# Patient Record
Sex: Male | Born: 1945 | Race: White | Hispanic: No | Marital: Married | Smoking: Never smoker
Health system: Southern US, Community
[De-identification: ages and names within clinical notes are randomized; demographics above are authoritative.]

## PROBLEM LIST (undated history)

## (undated) DIAGNOSIS — N200 Calculus of kidney: Secondary | ICD-10-CM

## (undated) DIAGNOSIS — I4891 Unspecified atrial fibrillation: Secondary | ICD-10-CM

## (undated) DIAGNOSIS — M722 Plantar fascial fibromatosis: Secondary | ICD-10-CM

## (undated) DIAGNOSIS — I509 Heart failure, unspecified: Secondary | ICD-10-CM

## (undated) HISTORY — DX: Plantar fascial fibromatosis: M72.2

## (undated) HISTORY — PX: OTHER SURGICAL HISTORY: SHX169

## (undated) HISTORY — DX: Calculus of kidney: N20.0

## (undated) HISTORY — PX: COLONOSCOPY: SHX174

---
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
## 2002-01-07 ENCOUNTER — Encounter: Payer: Self-pay | Admitting: Internal Medicine

## 2002-03-01 ENCOUNTER — Encounter: Payer: Self-pay | Admitting: Internal Medicine

## 2004-02-28 ENCOUNTER — Ambulatory Visit: Payer: Self-pay | Admitting: Internal Medicine

## 2004-03-06 ENCOUNTER — Ambulatory Visit: Payer: Self-pay | Admitting: Internal Medicine

## 2005-03-07 ENCOUNTER — Ambulatory Visit: Payer: Self-pay | Admitting: Internal Medicine

## 2005-03-25 ENCOUNTER — Ambulatory Visit: Payer: Self-pay | Admitting: Internal Medicine

## 2006-04-04 ENCOUNTER — Ambulatory Visit: Payer: Self-pay | Admitting: Internal Medicine

## 2006-04-04 LAB — CONVERTED CEMR LAB
ALT: 14 units/L (ref 0–40)
AST: 36 units/L (ref 0–37)
Albumin: 3.9 g/dL (ref 3.5–5.2)
Alkaline Phosphatase: 88 units/L (ref 39–117)
BUN: 29 mg/dL — ABNORMAL HIGH (ref 6–23)
Basophils Absolute: 0 10*3/uL (ref 0.0–0.1)
Basophils Relative: 0.4 % (ref 0.0–1.0)
CO2: 29 meq/L (ref 19–32)
Calcium: 9.3 mg/dL (ref 8.4–10.5)
Chloride: 109 meq/L (ref 96–112)
Cholesterol: 204 mg/dL (ref 0–200)
Creatinine, Ser: 1.3 mg/dL (ref 0.4–1.5)
Direct LDL: 141.1 mg/dL
Eosinophils Relative: 3.9 % (ref 0.0–5.0)
GFR calc Af Amer: 73 mL/min
GFR calc non Af Amer: 60 mL/min
Glucose, Bld: 90 mg/dL (ref 70–99)
HCT: 50.1 % (ref 39.0–52.0)
HDL: 44.5 mg/dL (ref >39.0)
Hemoglobin: 16.7 g/dL (ref 13.0–17.0)
Lymphocytes Relative: 33.7 % (ref 12.0–46.0)
MCHC: 33.3 g/dL (ref 30.0–36.0)
MCV: 91.6 fL (ref 78.0–100.0)
Monocytes Absolute: 0.9 10*3/uL — ABNORMAL HIGH (ref 0.2–0.7)
Monocytes Relative: 15.1 % — ABNORMAL HIGH (ref 3.0–11.0)
Neutro Abs: 2.9 10*3/uL (ref 1.4–7.7)
Neutrophils Relative %: 46.9 % (ref 43.0–77.0)
PSA: 0.59 ng/mL (ref 0.10–4.00)
Platelets: 307 10*3/uL (ref 150–400)
Potassium: 4.9 meq/L (ref 3.5–5.1)
RBC: 5.47 M/uL (ref 4.22–5.81)
RDW: 11.9 % (ref 11.5–14.6)
Sodium: 143 meq/L (ref 135–145)
TSH: 1.26 microintl units/mL (ref 0.35–5.50)
Total Bilirubin: 1.4 mg/dL — ABNORMAL HIGH (ref 0.3–1.2)
Total CHOL/HDL Ratio: 4.6
Total Protein: 6.7 g/dL (ref 6.0–8.3)
Triglycerides: 107 mg/dL (ref 0–149)
VLDL: 21 mg/dL (ref 0–40)
WBC: 6 10*3/uL (ref 4.5–10.5)

## 2006-04-11 ENCOUNTER — Ambulatory Visit: Payer: Self-pay | Admitting: Internal Medicine

## 2007-03-30 ENCOUNTER — Ambulatory Visit: Payer: Self-pay | Admitting: Internal Medicine

## 2007-03-30 LAB — CONVERTED CEMR LAB
ALT: 10 units/L (ref 0–53)
AST: 24 units/L (ref 0–37)
Albumin: 3.9 g/dL (ref 3.5–5.2)
Alkaline Phosphatase: 80 units/L (ref 39–117)
BUN: 17 mg/dL (ref 6–23)
Basophils Absolute: 0 10*3/uL (ref 0.0–0.1)
Calcium: 9.3 mg/dL (ref 8.4–10.5)
Chloride: 104 meq/L (ref 96–112)
Creatinine, Ser: 1.1 mg/dL (ref 0.4–1.5)
Eosinophils Absolute: 0.4 10*3/uL (ref 0.0–0.6)
GFR calc non Af Amer: 73 mL/min
Glucose, Urine, Semiquant: 100
HCT: 44.9 % (ref 39.0–52.0)
HDL: 43.3 mg/dL (ref >39.0)
Ketones, urine, test strip: NEGATIVE
MCHC: 35 g/dL (ref 30.0–36.0)
MCV: 91.2 fL (ref 78.0–100.0)
Monocytes Relative: 11.2 % — ABNORMAL HIGH (ref 3.0–11.0)
Nitrite: NEGATIVE
PSA: 0.56 ng/mL (ref 0.10–4.00)
Platelets: 256 10*3/uL (ref 150–400)
Protein, U semiquant: NEGATIVE
RBC: 4.92 M/uL (ref 4.22–5.81)
RDW: 12.1 % (ref 11.5–14.6)
Sodium: 139 meq/L (ref 135–145)
Specific Gravity, Urine: 1.015
Total Bilirubin: 1.1 mg/dL (ref 0.3–1.2)
Total CHOL/HDL Ratio: 4.7
Triglycerides: 98 mg/dL (ref 0–149)
WBC Urine, dipstick: NEGATIVE
pH: 6

## 2007-04-08 ENCOUNTER — Telehealth: Payer: Self-pay | Admitting: Internal Medicine

## 2007-04-08 ENCOUNTER — Telehealth (INDEPENDENT_AMBULATORY_CARE_PROVIDER_SITE_OTHER): Payer: Self-pay | Admitting: *Deleted

## 2007-04-14 ENCOUNTER — Ambulatory Visit: Payer: Self-pay | Admitting: Internal Medicine

## 2007-04-14 DIAGNOSIS — Z87442 Personal history of urinary calculi: Secondary | ICD-10-CM | POA: Insufficient documentation

## 2007-04-22 ENCOUNTER — Ambulatory Visit: Payer: Self-pay | Admitting: Internal Medicine

## 2008-01-19 ENCOUNTER — Encounter: Payer: Self-pay | Admitting: Internal Medicine

## 2009-02-22 ENCOUNTER — Ambulatory Visit: Payer: Self-pay | Admitting: Internal Medicine

## 2009-02-22 LAB — CONVERTED CEMR LAB
BUN: 19 mg/dL (ref 6–23)
Bilirubin Urine: NEGATIVE
Bilirubin, Direct: 0.1 mg/dL (ref 0.0–0.3)
CO2: 28 meq/L (ref 19–32)
Calcium: 9.1 mg/dL (ref 8.4–10.5)
Chloride: 109 meq/L (ref 96–112)
Cholesterol: 210 mg/dL — ABNORMAL HIGH (ref 0–200)
Creatinine, Ser: 1.2 mg/dL (ref 0.4–1.5)
Direct LDL: 138 mg/dL
Eosinophils Absolute: 0.6 10*3/uL (ref 0.0–0.7)
Glucose, Urine, Semiquant: NEGATIVE
Ketones, urine, test strip: NEGATIVE
MCHC: 34 g/dL (ref 30.0–36.0)
MCV: 95.1 fL (ref 78.0–100.0)
Monocytes Absolute: 0.6 10*3/uL (ref 0.1–1.0)
Neutrophils Relative %: 37.3 % — ABNORMAL LOW (ref 43.0–77.0)
PSA: 0.92 ng/mL (ref 0.10–4.00)
Platelets: 143 10*3/uL — ABNORMAL LOW (ref 150.0–400.0)
Protein, U semiquant: NEGATIVE
Total Bilirubin: 1.6 mg/dL — ABNORMAL HIGH (ref 0.3–1.2)
Triglycerides: 87 mg/dL (ref 0.0–149.0)
VLDL: 17.4 mg/dL (ref 0.0–40.0)
WBC: 5.7 10*3/uL (ref 4.5–10.5)
pH: 5

## 2009-03-01 ENCOUNTER — Ambulatory Visit: Payer: Self-pay | Admitting: Internal Medicine

## 2010-01-18 ENCOUNTER — Telehealth: Payer: Self-pay | Admitting: Internal Medicine

## 2010-04-17 NOTE — Progress Notes (Signed)
Summary: REQ FOR SHINGLES VACC RX  Phone Note Call from Patient   Caller: Spouse   [phone] Summary of Call: Pt called to adv she and her husband need a Rx for shingles vaccine...Marland KitchenMarland KitchenMarland Kitchen Pt can be reached at [phone] to advise when same has been prepared and is ready for p/u.  Initial call taken by: Debbra Riding,  January 18, 2010 10:18 AM  Follow-up for Phone Call        [facility] does not do shingles injections so pt will schedule appt to come here at a later date Follow-up by: Alfred Levins, CMA,  January 18, 2010 3:16 PM

## 2011-02-26 ENCOUNTER — Other Ambulatory Visit: Payer: Self-pay | Admitting: Internal Medicine

## 2011-02-26 ENCOUNTER — Other Ambulatory Visit (INDEPENDENT_AMBULATORY_CARE_PROVIDER_SITE_OTHER): Payer: BC Managed Care – PPO

## 2011-02-26 ENCOUNTER — Encounter: Payer: Self-pay | Admitting: Internal Medicine

## 2011-02-26 ENCOUNTER — Ambulatory Visit (INDEPENDENT_AMBULATORY_CARE_PROVIDER_SITE_OTHER): Payer: BC Managed Care – PPO | Admitting: Internal Medicine

## 2011-02-26 VITALS — BP 124/68 | HR 53 | Temp 98.0°F | Resp 16 | Ht 70.0 in | Wt 179.8 lb

## 2011-02-26 DIAGNOSIS — Z87442 Personal history of urinary calculi: Secondary | ICD-10-CM

## 2011-02-26 DIAGNOSIS — Z0000 Encounter for general adult medical examination without abnormal findings: Secondary | ICD-10-CM

## 2011-02-26 DIAGNOSIS — M722 Plantar fascial fibromatosis: Secondary | ICD-10-CM

## 2011-02-26 NOTE — Progress Notes (Signed)
  Subjective:    Patient ID: Walter George, male    DOB: 04/07/1945, 65 y.o.   MRN: [id number]  HPI Walter George presents to establish for continuity of care in transfer from Dr. Cato Mulligan. He is feeling well. He does have heel pain - plantar fasciitis. He does remain very active doing multi-sports. He has no active medical problems.  Past Medical History  Diagnosis Date  . Kidney stones     3 stones, last 20 years ago.  . Plantar fasciitis    Past Surgical History  Procedure Date  . Tonsillectomy 1953   Family History  Problem Relation Age of Onset  . Cancer Father     lymphoma  . Dementia Mother   . Dementia Paternal Aunt    History   Social History  . Marital Status: Married    Spouse Name: N/A    Number of Children: 2  . Years of Education: 16   Occupational History  . banker     retired   Social History Main Topics  . Smoking status: Never Smoker   . Smokeless tobacco: Never Used  . Alcohol Use: 4.5 oz/week    9 drink(s) per week     Occassionaly  . Drug Use: No  . Sexually Active: Yes -- Male partner(s)   Other Topics Concern  . Not on file   Social History Narrative   HSG, [place]. Married '73. 1 dtr - '76, 1 son '79; 4 grandchildren. Work Chief of Staff, retired. Very active: sports, civic activities.        Review of Systems Constitutional:  Negative for fever, chills, activity change and unexpected weight change.  HEENT:  Negative for hearing loss, ear pain, congestion, neck stiffness and postnasal drip. Negative for sore throat or swallowing problems. Negative for dental complaints.   Eyes: Negative for vision loss or change in visual acuity.  Respiratory: Negative for chest tightness and wheezing. Negative for DOE.   Cardiovascular: Negative for chest pain or palpitations. No decreased exercise tolerance Gastrointestinal: No change in bowel habit. No bloating or gas. No reflux or indigestion Genitourinary: Negative for urgency,  frequency, flank pain and difficulty urinating.  Musculoskeletal: Negative for myalgias, back pain, arthralgias and gait problem.  Neurological: Negative for dizziness, tremors, weakness and headaches.  Hematological: Negative for adenopathy.  Psychiatric/Behavioral: Negative for behavioral problems and dysphoric mood.       Objective:   Physical Exam Vitals reviewed - stable Gen'l- well nourished, athletic appearing white male in no distress HEENT- Cerumen impactions bilaterally - after irrigation TMs normal; C&S clear, pupils equal,round and reactive; oropharynx with native dentition in good repair, no buccal or palatal lesions; throat clear Neck - supple, w/o thyromegaly Nodes - negative submandibular, cervical and supraclavicular regions Chest - no deformity Pulm - normal breath sounds Cor - 2+ radial and DP pulses, RRR w/o murmur, rub, gallop Abd - BS+ x 4 quadrants, no hepato-splenomegaly, no guarding or rebound Genitalia - deferred. Normal PSA '09,'10,'11 Extremities - no deformity, normal ROM small, medium and large joints Neuro - A&O x 3, CN II-XII normal, MS normal, gait and balance normal. Derm - fair skin, no lesions noted face, neck, upper back, chest or abdomen          Assessment & Plan:

## 2011-02-27 DIAGNOSIS — M722 Plantar fascial fibromatosis: Secondary | ICD-10-CM | POA: Insufficient documentation

## 2011-02-27 DIAGNOSIS — Z0000 Encounter for general adult medical examination without abnormal findings: Secondary | ICD-10-CM | POA: Insufficient documentation

## 2011-02-27 LAB — HEPATIC FUNCTION PANEL
ALT: 11 U/L (ref 0–53)
AST: 25 U/L (ref 0–37)
Alkaline Phosphatase: 80 U/L (ref 39–117)
Bilirubin, Direct: 0.1 mg/dL (ref 0.0–0.3)
Total Protein: 6.4 g/dL (ref 6.0–8.3)

## 2011-02-27 LAB — LIPID PANEL
Cholesterol: 209 mg/dL — ABNORMAL HIGH (ref 0–200)
HDL: 60.1 mg/dL (ref >39.00)
Total CHOL/HDL Ratio: 3
VLDL: 21.8 mg/dL (ref 0.0–40.0)

## 2011-02-27 LAB — COMPREHENSIVE METABOLIC PANEL
AST: 25 U/L (ref 0–37)
Alkaline Phosphatase: 80 U/L (ref 39–117)
BUN: 27 mg/dL — ABNORMAL HIGH (ref 6–23)
Creatinine, Ser: 1.5 mg/dL (ref 0.4–1.5)

## 2011-02-27 NOTE — Assessment & Plan Note (Signed)
No complaints. Suggested using generous amount of lemon in water (lime in [place]) to acidify urine and further reduce risk of recurrent stone.

## 2011-02-27 NOTE — Assessment & Plan Note (Signed)
Nagging type of pain that does not limit activities. He has not had sports medicine evaluation and he does not use inserts.  Plan - refer to [email or web address], search plantar fasciitis and stretch for instructional videos           For worsening pain or limitations will refer to Dr. Roanna Epley

## 2011-02-27 NOTE — Assessment & Plan Note (Signed)
Medical history is benign. Physical exam is normal except for cerumen impactions. These impactions were easily irrigated clear. Recent lab reviewed: long history of normal lipid panels - no indication to repeat. Discussed pros and cons of prostate cancer screening (USPHCTF recommendations reviewed) and with normal PSA 3 years in a row he defers evaluation at this time. He is current for colorectal cancer screening but is due in 2013. Skin care - he sees Dr. Donzetta Starch on a regular basis. Basic labs are ordered and pending.  In summary - a very nice man who is medically stable. He is oriented to our services including after hours [facility], Saturday clinic and in-patient services that I offer. He will return as needed or in 1 year.

## 2011-03-03 ENCOUNTER — Encounter: Payer: Self-pay | Admitting: Internal Medicine

## 2012-02-04 ENCOUNTER — Encounter: Payer: Self-pay | Admitting: Gastroenterology

## 2012-03-19 ENCOUNTER — Encounter: Payer: BC Managed Care – PPO | Admitting: Internal Medicine

## 2012-04-30 ENCOUNTER — Encounter: Payer: BC Managed Care – PPO | Admitting: Internal Medicine

## 2012-05-06 ENCOUNTER — Encounter: Payer: Self-pay | Admitting: Internal Medicine

## 2012-05-06 ENCOUNTER — Ambulatory Visit (INDEPENDENT_AMBULATORY_CARE_PROVIDER_SITE_OTHER): Payer: Medicare Other | Admitting: Internal Medicine

## 2012-05-06 VITALS — BP 112/76 | HR 50 | Temp 98.2°F | Resp 10 | Ht 69.5 in | Wt 178.2 lb

## 2012-05-06 DIAGNOSIS — Z1211 Encounter for screening for malignant neoplasm of colon: Secondary | ICD-10-CM

## 2012-05-06 DIAGNOSIS — Z0000 Encounter for general adult medical examination without abnormal findings: Secondary | ICD-10-CM

## 2012-05-06 DIAGNOSIS — Z136 Encounter for screening for cardiovascular disorders: Secondary | ICD-10-CM

## 2012-05-06 NOTE — Progress Notes (Signed)
Subjective:    Patient ID: Walter George, male    DOB: [birth year], 67 y.o.   MRN: [id number]  HPI Walter George is here for annual Medicare wellness examination and management of other chronic and acute problems. He is feeling well and has no medical problems since his last visit.    The risk factors are reflected in the social history.  The roster of all physicians providing medical care to patient - is listed in the Snapshot section of the chart.  Activities of daily living:  The patient is 100% inedpendent in all ADLs: dressing, toileting, feeding as well as independent mobility  Home safety : The patient has smoke detectors in the home. Falls - no falls. Home is fall safe. They wear seatbelts.  firearms are present in the home, kept in a safe fashion. There is no violence in the home.   There is no risks for hepatitis, STDs or HIV. There is no history of blood transfusion. They have no travel history to infectious disease endemic areas of the world.  The patient has seen their dentist in the last six month. They have  seen their eye doctor in the last year. They deny any hearing difficulty and have not had audiologic testing in the last year.    They do not  have excessive sun exposure. Discussed the need for sun protection: hats, long sleeves and use of sunscreen if there is significant sun exposure.   Diet: the importance of a healthy diet is discussed. They do have a healthy diet.  The patient has a regular exercise program: tennis, jogging , 90 min duration, 3-4 per week.  The benefits of regular aerobic exercise were discussed.  Depression screen: there are no signs or vegative symptoms of depression- irritability, change in appetite, anhedonia, sadness/tearfullness.  Cognitive assessment: the patient manages all their financial and personal affairs and is actively engaged.   The following portions of the patient's history were reviewed and updated as appropriate:  allergies, current medications, past family history, past medical history,  past surgical history, past social history  and problem list.  Past Medical History  Diagnosis Date  . Kidney stones     3 stones, last 20 years ago.  . Plantar fasciitis    Past Surgical History  Procedure Laterality Date  . Tonsillectomy  1953   Family History  Problem Relation Age of Onset  . Cancer Father     lymphoma  . Dementia Mother   . Dementia Paternal Aunt    History   Social History  . Marital Status: Married    Spouse Name: N/A    Number of Children: 2  . Years of Education: 16   Occupational History  . banker     retired   Social History Main Topics  . Smoking status: Never Smoker   . Smokeless tobacco: Never Used  . Alcohol Use: 4.5 oz/week    9 drink(s) per week     Comment: Occassionaly  . Drug Use: No  . Sexually Active: Yes -- Male partner(s)   Other Topics Concern  . Not on file   Social History Narrative   HSG, [place]. Married '73. 1 dtr - '76, 1 son '79; 4 grandchildren. Work Chief of Staff, retired. Very active: sports, civic activities.                       No current outpatient prescriptions on file prior to visit.   No  current facility-administered medications on file prior to visit.     Vision, hearing, body mass index were assessed and reviewed.   During the course of the visit the patient was educated and counseled about appropriate screening and preventive services including : fall prevention , diabetes screening, nutrition counseling, colorectal cancer screening, and recommended immunizations.    Review of Systems Constitutional:  Negative for fever, chills, activity change and unexpected weight change.  HEENT:  Negative for hearing loss, ear pain, congestion, neck stiffness and postnasal drip. Negative for sore throat or swallowing problems. Negative for dental complaints.   Eyes: Negative for vision loss or change in visual acuity.   Respiratory: Negative for chest tightness and wheezing. Negative for DOE.   Cardiovascular: Negative for chest pain or palpitations. No decreased exercise tolerance Gastrointestinal: No change in bowel habit. No bloating or gas. No reflux or indigestion Genitourinary: Negative for urgency, frequency, flank pain and difficulty urinating.  Musculoskeletal: Negative for myalgias, back pain, arthralgias and gait problem.  Neurological: Negative for dizziness, tremors, weakness and headaches.  Hematological: Negative for adenopathy.  Psychiatric/Behavioral: Negative for behavioral problems and dysphoric mood.       Objective:   Physical Exam Filed Vitals:   05/06/12 1600  BP: 112/76  Pulse: 50  Temp: 98.2 F (36.8 C)  Resp: 10   Wt Readings from Last 3 Encounters:  05/06/12 178 lb 3.2 oz (80.831 kg)  02/26/11 179 lb 12 oz (81.534 kg)  03/01/09 180 lb (81.647 kg)   Gen'l: Well nourished well developed white male in no acute distress  HEENT: Head: Normocephalic and atraumatic. Right Ear: External ear normal. EAC/TM nl. Left Ear: External ear normal.  EAC - cerumen burden/TM poorly visualized. Nose: Nose normal. Mouth/Throat: Oropharynx is clear and moist. Dentition - native, in good repair. No buccal or palatal lesions. Posterior pharynx clear. Eyes: Conjunctivae and sclera clear. EOM intact. Pupils are equal, round, and reactive to light. Right eye exhibits no discharge. Left eye exhibits no discharge. Neck: Normal range of motion. Neck supple. No JVD present. No tracheal deviation present. No thyromegaly present.  Cardiovascular: Normal rate, regular rhythm, no gallop, no friction rub, no murmur heard.      Quiet precordium. 2+ radial and DP pulses . No carotid bruits Pulmonary/Chest: Effort normal. No respiratory distress or increased WOB, no wheezes, no rales. No chest wall deformity or CVAT. Abdomen: Soft. Bowel sounds are normal in all quadrants. He exhibits no distension, no  tenderness, no rebound or guarding, No heptosplenomegaly  Genitourinary:  deferred Musculoskeletal: Normal range of motion. He exhibits no edema and no tenderness.       Small and large joints without redness, synovial thickening or deformity. Full range of motion preserved about all small, median and large joints.  Lymphadenopathy:    He has no cervical or supraclavicular adenopathy.  Neurological: He is alert and oriented to person, place, and time. CN II-XII intact. DTRs 2+ and symmetrical biceps, radial and patellar tendons. Cerebellar function normal with no tremor, rigidity, normal gait and station.  Skin: Skin is warm and dry. No rash noted. No erythema.  Psychiatric: He has a normal mood and affect. His behavior is normal. Thought content normal.   Lab Results  Component Value Date   WBC 5.7 02/22/2009   HGB 16.0 02/22/2009   HCT 47.2 02/22/2009   PLT 143.0* 02/22/2009   GLUCOSE 86 02/26/2011   CHOL 209* 02/26/2011   TRIG 109.0 02/26/2011   HDL 60.10 02/26/2011  LDLDIRECT 126.2 02/26/2011   ALT 11 02/26/2011   ALT 11 02/26/2011   AST 25 02/26/2011   AST 25 02/26/2011   NA 143 02/26/2011   K 4.5 02/26/2011   CL 110 02/26/2011   CREATININE 1.5 02/26/2011   BUN 27* 02/26/2011   CO2 28 02/26/2011   TSH 1.19 02/26/2011   PSA 0.92 02/22/2009   12 lead EKG - normal        Assessment & Plan:

## 2012-05-07 NOTE — Assessment & Plan Note (Signed)
Interval history is unremarkable. Physical exam is normal except for cerumen left ear. Reviewed previous labs - being on no medications with no active medical problems there is no indication to repeat labs that were normal. He is due for follow up colonoscopy and is referred to Dr. Jarold Motto. Discussed pros and cons of prostate cancer screening (USPHCTF recommendations reviewed and ACU April '13 recommendations) and he defers evaluation at this time. Immunizations are brought up to date. 12 Lead EKG is normal  In summary - a very pleasant man who is medically stable and doing well. He is encouraged to continue his very healthy life-style and  Welcome to Medicare.

## 2012-05-08 ENCOUNTER — Encounter: Payer: Self-pay | Admitting: Gastroenterology

## 2012-07-09 ENCOUNTER — Ambulatory Visit (AMBULATORY_SURGERY_CENTER): Payer: Medicare Other | Admitting: *Deleted

## 2012-07-09 ENCOUNTER — Encounter: Payer: Self-pay | Admitting: Gastroenterology

## 2012-07-09 VITALS — Ht 70.0 in | Wt 177.8 lb

## 2012-07-09 DIAGNOSIS — Z1211 Encounter for screening for malignant neoplasm of colon: Secondary | ICD-10-CM

## 2012-07-09 MED ORDER — MOVIPREP 100 G PO SOLR: 1.0000 | Freq: Once | ORAL | Status: DC

## 2012-07-09 NOTE — Progress Notes (Signed)
No egg or soy allergy. ewm  No problems with sedation in the past. ewm 

## 2012-07-15 ENCOUNTER — Ambulatory Visit (AMBULATORY_SURGERY_CENTER): Payer: Medicare Other | Admitting: Gastroenterology

## 2012-07-15 ENCOUNTER — Encounter: Payer: Self-pay | Admitting: Gastroenterology

## 2012-07-15 VITALS — BP 139/89 | HR 47 | Temp 96.1°F | Resp 16 | Ht 70.0 in | Wt 177.0 lb

## 2012-07-15 DIAGNOSIS — K5730 Diverticulosis of large intestine without perforation or abscess without bleeding: Secondary | ICD-10-CM

## 2012-07-15 DIAGNOSIS — Z1211 Encounter for screening for malignant neoplasm of colon: Secondary | ICD-10-CM

## 2012-07-15 MED ORDER — SODIUM CHLORIDE 0.9 % IV SOLN: 500.0000 mL | INTRAVENOUS | Status: DC

## 2012-07-15 NOTE — Progress Notes (Signed)
Patient did not experience any of the following events: a burn prior to discharge; a fall within the facility; wrong site/side/patient/procedure/implant event; or a hospital transfer or hospital admission upon discharge from the facility. (G8907) Patient did not have preoperative order for IV antibiotic SSI prophylaxis. (G8918)  

## 2012-07-15 NOTE — Op Note (Signed)
[facility] [address] [place], [address]   COLONOSCOPY PROCEDURE REPORT  PATIENT: Walter George, Walter George  MR#: [id number] BIRTHDATE: February 19, 1946 , 66  yrs. old GENDER: Male ENDOSCOPIST: Mardella Layman, MD, [facility] REFERRED BY: PROCEDURE DATE:  07/15/2012 PROCEDURE:   Colonoscopy, screening ASA CLASS:   Class II INDICATIONS:Average risk patient for colon cancer. MEDICATIONS: propofol (Diprivan) 200mg  IV  DESCRIPTION OF PROCEDURE:   After the risks and benefits and of the procedure were explained, informed consent was obtained.  A digital rectal exam revealed no abnormalities of the rectum.    The LB CF-[id number]  endoscope was introduced through the anus and advanced to the cecum, which was identified by both the appendix and ileocecal valve .  The quality of the prep was excellent, using MoviPrep .  The instrument was then slowly withdrawn as the colon was fully examined.     COLON FINDINGS: Mild diverticulosis was noted in the sigmoid colon. The colon was otherwise normal.  There was no diverticulosis, inflammation, polyps or cancers unless previously stated. Retroflexed views revealed no abnormalities.     The scope was then withdrawn from the patient and the procedure completed.  COMPLICATIONS: There were no complications. ENDOSCOPIC IMPRESSION: 1.   Mild diverticulosis was noted in the sigmoid colon 2.   The colon was otherwise normal ...no polyps noted.  RECOMMENDATIONS: 1.  High fiber diet 2.  Continue current colorectal screening recommendations for "routine risk" patients with a repeat colonoscopy in 10 years.   REPEAT EXAM:  XB:JYNWGNF E Norins, MD  _______________________________ eSigned:  Mardella Layman, MD, [facility] 07/15/2012 8:45 AM

## 2012-07-15 NOTE — Patient Instructions (Signed)
YOU HAD AN ENDOSCOPIC PROCEDURE TODAY AT THE [facility]: Refer to the procedure report that was given to you for any specific questions about what was found during the examination.  If the procedure report does not answer your questions, please call your gastroenterologist to clarify.  If you requested that your care partner not be given the details of your procedure findings, then the procedure report has been included in a sealed envelope for you to review at your convenience later.  YOU SHOULD EXPECT: Some feelings of bloating in the abdomen. Passage of more gas than usual.  Walking can help get rid of the air that was put into your GI tract during the procedure and reduce the bloating. If you had a lower endoscopy (such as a colonoscopy or flexible sigmoidoscopy) you may notice spotting of blood in your stool or on the toilet paper. If you underwent a bowel prep for your procedure, then you may not have a normal bowel movement for a few days.  DIET: Your first meal following the procedure should be a light meal and then it is ok to progress to your normal diet.  A half-sandwich or bowl of soup is an example of a good first meal.  Heavy or fried foods are harder to digest and may make you feel nauseous or bloated.  Likewise meals heavy in dairy and vegetables can cause extra gas to form and this can also increase the bloating.  Drink plenty of fluids but you should avoid alcoholic beverages for 24 hours.  ACTIVITY: Your care partner should take you home directly after the procedure.  You should plan to take it easy, moving slowly for the rest of the day.  You can resume normal activity the day after the procedure however you should NOT DRIVE or use heavy machinery for 24 hours (because of the sedation medicines used during the test).    SYMPTOMS TO REPORT IMMEDIATELY: A gastroenterologist can be reached at any hour.  During normal business hours, 8:30 AM to 5:00 PM Monday through Friday,  call [phone].  After hours and on weekends, please call the GI answering service at [phone] who will take a message and have the physician on call contact you.   Following lower endoscopy (colonoscopy or flexible sigmoidoscopy):  Excessive amounts of blood in the stool  Significant tenderness or worsening of abdominal pains  Swelling of the abdomen that is new, acute  Fever of 100F or higher   FOLLOW UP: If any biopsies were taken you will be contacted by phone or by letter within the next 1-3 weeks.  Call your gastroenterologist if you have not heard about the biopsies in 3 weeks.  Our staff will call the home number listed on your records the next business day following your procedure to check on you and address any questions or concerns that you may have at that time regarding the information given to you following your procedure. This is a courtesy call and so if there is no answer at the home number and we have not heard from you through the emergency physician on call, we will assume that you have returned to your regular daily activities without incident.  SIGNATURES/CONFIDENTIALITY: You and/or your care partner have signed paperwork which will be entered into your electronic medical record.  These signatures attest to the fact that that the information above on your After Visit Summary has been reviewed and is understood.  Full responsibility of the confidentiality of   this discharge information lies with you and/or your care-partner.    INFORMATION ON DIVERTICULOSIS & HIGH FIBER DIET GIVEN TO YOU TODAY 

## 2012-07-15 NOTE — Progress Notes (Signed)
Lidocaine-40mg IV prior to Propofol InductionPropofol given over incremental dosages 

## 2012-07-16 ENCOUNTER — Telehealth: Payer: Self-pay | Admitting: *Deleted

## 2012-07-16 NOTE — Telephone Encounter (Signed)
  Follow up Call-  Call back number 07/15/2012  Post procedure Call Back phone  # [phone]  Permission to leave phone message Yes     Patient questions:  Do you have a fever, pain , or abdominal swelling? no Pain Score  0 *  Have you tolerated food without any problems? yes  Have you been able to return to your normal activities? yes  Do you have any questions about your discharge instructions: Diet   no Medications  no Follow up visit  no  Do you have questions or concerns about your Care? no  Actions: * If pain score is 4 or above: No action needed, pain <4.

## 2014-04-26 ENCOUNTER — Ambulatory Visit (INDEPENDENT_AMBULATORY_CARE_PROVIDER_SITE_OTHER): Payer: BLUE CROSS/BLUE SHIELD | Admitting: Sports Medicine

## 2014-04-26 ENCOUNTER — Encounter: Payer: Self-pay | Admitting: Sports Medicine

## 2014-04-26 VITALS — BP 123/77 | Ht 69.0 in | Wt 172.0 lb

## 2014-04-26 DIAGNOSIS — M25562 Pain in left knee: Secondary | ICD-10-CM

## 2014-04-26 DIAGNOSIS — M23204 Derangement of unspecified medial meniscus due to old tear or injury, left knee: Secondary | ICD-10-CM

## 2014-04-26 NOTE — Assessment & Plan Note (Signed)
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  Recommendations: -Avoiding aggressive running and jogging activities that put excessive impact of the joints. -Incorporate biking on a regular basis for cardiovascular fitness -Provided patient with home exercises work on quad strengthening with isometric contractions straight leg raises, extensions, leg presses and squats. Advised patient to avoid deep squatting and bending staying between approximate 5-45. -Continue use over-the-counter anti-inflammatories as needed -Wear provided compression sleeve with any activities in approximately 30 minutes after activities to control swelling and provide stability.  -Follow-up when necessary

## 2014-04-26 NOTE — Progress Notes (Signed)
  Walter George - 69 y.o. male MRN [id number]  Date of birth: 1945-11-16  SUBJECTIVE:  Including CC & ROS.  Mr. Walter George is a pleasant 69 year old male who presents today with left knee pain. Patient reports that he is an avid jogger, tennis player and golfer and notice increasing pain with these activities over the past 3-5 months. Started approximately in September while going on a job he felt his knee give out. Denied any severe pain at that time. Ever since that time he's had intermittent tightness posterior due to mild swelling, difficulty with cutting sprinting and pivoting and tenderness. Occasionally with intermittent sharp pain particularly on the medial aspect of the joint line. Denies any clicking locking. Has been treating with anti-inflammatory such as ibuprofen and Voltaren gel.   ROS: Review of systems otherwise negative except for information present in HPI  HISTORY: Past Medical, Surgical, Social, and Family History Reviewed & Updated per EMR. Pertinent Historical Findings include: History of plantar fasciitis and kidney stone Patient's a nonsmoker  PHYSICAL EXAM:  VS: BP:123/77 mmHg  HR: bpm  TEMP: ( )  RESP:   HT:5\' 9"  (175.3 cm)   WT:172 lb (78.019 kg)  BMI:25.5 KNEE EXAM:  General: well nourished, no acute distress Skin of LE: warm; dry, no rashes, lesions, ecchymosis or erythema. Vascular: Dorsal pedal pulses 2+ bilaterally Neurologically: Sensation to light touch lower extremities equal and intact  Normal to inspection with no erythema or effusion or obvious bony abnormalities. Palpation : Medial joint line tenderness particularly on the posterior meniscus. No lateral joint line tenderness. No patella tendon or quadriceps tenderness. ROM normal in flexion and extension and lower leg rotation. Range of motion:  ROM normal in flexion and extension and lower leg rotation. Ligaments with solid consistent endpoints including ACL, PCL, LCL, MCL. Negative patella  apprehension and normal tracking Meniscal evaluation: Positive McMurray's test, positive thessaly's test, Normal gait. Hamstring and quadriceps strength is normal.  MSK US: Ultrasound revealed normal quadriceps tendon, normal patella tendon with only mild calcific changes. No joint effusion in the suprapatellar region. Lateral meniscus shows some degenerative calcific  and small tears. Medial meniscus shows similar degenerative changes with thinning, calcification, and splitting causing protruding of the meniscus.  ASSESSMENT & PLAN: See problem based charting & AVS for pt instructions.

## 2014-09-12 ENCOUNTER — Other Ambulatory Visit: Payer: Self-pay

## 2015-04-20 DIAGNOSIS — H52223 Regular astigmatism, bilateral: Secondary | ICD-10-CM | POA: Diagnosis not present

## 2015-04-20 DIAGNOSIS — H5203 Hypermetropia, bilateral: Secondary | ICD-10-CM | POA: Diagnosis not present

## 2015-04-20 DIAGNOSIS — H524 Presbyopia: Secondary | ICD-10-CM | POA: Diagnosis not present

## 2015-04-20 DIAGNOSIS — H2513 Age-related nuclear cataract, bilateral: Secondary | ICD-10-CM | POA: Diagnosis not present

## 2015-07-10 DIAGNOSIS — E784 Other hyperlipidemia: Secondary | ICD-10-CM | POA: Diagnosis not present

## 2015-07-10 DIAGNOSIS — Z125 Encounter for screening for malignant neoplasm of prostate: Secondary | ICD-10-CM | POA: Diagnosis not present

## 2015-07-17 DIAGNOSIS — Z0000 Encounter for general adult medical examination without abnormal findings: Secondary | ICD-10-CM | POA: Diagnosis not present

## 2015-07-17 DIAGNOSIS — E784 Other hyperlipidemia: Secondary | ICD-10-CM | POA: Diagnosis not present

## 2015-07-17 DIAGNOSIS — N401 Enlarged prostate with lower urinary tract symptoms: Secondary | ICD-10-CM | POA: Diagnosis not present

## 2015-07-17 DIAGNOSIS — Z1212 Encounter for screening for malignant neoplasm of rectum: Secondary | ICD-10-CM | POA: Diagnosis not present

## 2015-07-17 DIAGNOSIS — Z1389 Encounter for screening for other disorder: Secondary | ICD-10-CM | POA: Diagnosis not present

## 2015-07-17 DIAGNOSIS — L570 Actinic keratosis: Secondary | ICD-10-CM | POA: Diagnosis not present

## 2015-07-17 DIAGNOSIS — Z6825 Body mass index (BMI) 25.0-25.9, adult: Secondary | ICD-10-CM | POA: Diagnosis not present

## 2015-07-17 DIAGNOSIS — N200 Calculus of kidney: Secondary | ICD-10-CM | POA: Diagnosis not present

## 2016-02-19 DIAGNOSIS — L812 Freckles: Secondary | ICD-10-CM | POA: Diagnosis not present

## 2016-02-19 DIAGNOSIS — L570 Actinic keratosis: Secondary | ICD-10-CM | POA: Diagnosis not present

## 2016-02-19 DIAGNOSIS — L821 Other seborrheic keratosis: Secondary | ICD-10-CM | POA: Diagnosis not present

## 2016-02-19 DIAGNOSIS — C44712 Basal cell carcinoma of skin of right lower limb, including hip: Secondary | ICD-10-CM | POA: Diagnosis not present

## 2016-02-19 DIAGNOSIS — L820 Inflamed seborrheic keratosis: Secondary | ICD-10-CM | POA: Diagnosis not present

## 2016-02-19 DIAGNOSIS — D1801 Hemangioma of skin and subcutaneous tissue: Secondary | ICD-10-CM | POA: Diagnosis not present

## 2016-02-19 DIAGNOSIS — D485 Neoplasm of uncertain behavior of skin: Secondary | ICD-10-CM | POA: Diagnosis not present

## 2016-02-22 DIAGNOSIS — Z23 Encounter for immunization: Secondary | ICD-10-CM | POA: Diagnosis not present

## 2016-07-24 DIAGNOSIS — N401 Enlarged prostate with lower urinary tract symptoms: Secondary | ICD-10-CM | POA: Diagnosis not present

## 2016-07-24 DIAGNOSIS — E784 Other hyperlipidemia: Secondary | ICD-10-CM | POA: Diagnosis not present

## 2016-07-24 DIAGNOSIS — Z125 Encounter for screening for malignant neoplasm of prostate: Secondary | ICD-10-CM | POA: Diagnosis not present

## 2016-08-01 DIAGNOSIS — N401 Enlarged prostate with lower urinary tract symptoms: Secondary | ICD-10-CM | POA: Diagnosis not present

## 2016-08-01 DIAGNOSIS — Z0000 Encounter for general adult medical examination without abnormal findings: Secondary | ICD-10-CM | POA: Diagnosis not present

## 2016-08-01 DIAGNOSIS — L570 Actinic keratosis: Secondary | ICD-10-CM | POA: Diagnosis not present

## 2016-08-01 DIAGNOSIS — E784 Other hyperlipidemia: Secondary | ICD-10-CM | POA: Diagnosis not present

## 2016-08-05 DIAGNOSIS — Z1212 Encounter for screening for malignant neoplasm of rectum: Secondary | ICD-10-CM | POA: Diagnosis not present

## 2016-08-13 DIAGNOSIS — H524 Presbyopia: Secondary | ICD-10-CM | POA: Diagnosis not present

## 2016-12-26 DIAGNOSIS — Z23 Encounter for immunization: Secondary | ICD-10-CM | POA: Diagnosis not present

## 2017-01-29 ENCOUNTER — Other Ambulatory Visit: Payer: Self-pay | Admitting: Internal Medicine

## 2017-01-29 ENCOUNTER — Telehealth (HOSPITAL_COMMUNITY): Payer: Self-pay | Admitting: Internal Medicine

## 2017-01-29 DIAGNOSIS — R001 Bradycardia, unspecified: Secondary | ICD-10-CM

## 2017-01-29 DIAGNOSIS — R9431 Abnormal electrocardiogram [ECG] [EKG]: Secondary | ICD-10-CM

## 2017-01-29 NOTE — Telephone Encounter (Signed)
User: Trina AoGRIFFIN, Jhonny Calixto A Date/time: 01/29/17 10:52 AM  Comment: Called pt and lmsg for him to CB to get scheduled for an echo.   Context:  Outcome: Left Message  Phone number: [phone] Phone Type: Mobile  Comm. type: Telephone Call type: Outgoing  Contact: Sueanne Margaritaavenport, Jarvis E "Pete" Relation to patient: Self

## 2017-02-13 ENCOUNTER — Other Ambulatory Visit: Payer: Self-pay

## 2017-02-13 ENCOUNTER — Ambulatory Visit (HOSPITAL_COMMUNITY): Payer: Medicare Other | Attending: Internal Medicine

## 2017-02-13 DIAGNOSIS — E785 Hyperlipidemia, unspecified: Secondary | ICD-10-CM | POA: Insufficient documentation

## 2017-02-13 DIAGNOSIS — R001 Bradycardia, unspecified: Secondary | ICD-10-CM | POA: Diagnosis not present

## 2017-02-13 DIAGNOSIS — I517 Cardiomegaly: Secondary | ICD-10-CM | POA: Insufficient documentation

## 2017-02-13 DIAGNOSIS — R9431 Abnormal electrocardiogram [ECG] [EKG]: Secondary | ICD-10-CM | POA: Insufficient documentation

## 2017-02-18 DIAGNOSIS — Z85828 Personal history of other malignant neoplasm of skin: Secondary | ICD-10-CM | POA: Diagnosis not present

## 2017-02-18 DIAGNOSIS — L821 Other seborrheic keratosis: Secondary | ICD-10-CM | POA: Diagnosis not present

## 2017-02-18 DIAGNOSIS — L570 Actinic keratosis: Secondary | ICD-10-CM | POA: Diagnosis not present

## 2017-02-18 DIAGNOSIS — L812 Freckles: Secondary | ICD-10-CM | POA: Diagnosis not present

## 2017-02-18 DIAGNOSIS — C44519 Basal cell carcinoma of skin of other part of trunk: Secondary | ICD-10-CM | POA: Diagnosis not present

## 2017-05-07 DIAGNOSIS — H9113 Presbycusis, bilateral: Secondary | ICD-10-CM | POA: Diagnosis not present

## 2017-05-07 DIAGNOSIS — H6123 Impacted cerumen, bilateral: Secondary | ICD-10-CM | POA: Diagnosis not present

## 2017-07-25 DIAGNOSIS — Z125 Encounter for screening for malignant neoplasm of prostate: Secondary | ICD-10-CM | POA: Diagnosis not present

## 2017-07-25 DIAGNOSIS — R82998 Other abnormal findings in urine: Secondary | ICD-10-CM | POA: Diagnosis not present

## 2017-07-25 DIAGNOSIS — E7849 Other hyperlipidemia: Secondary | ICD-10-CM | POA: Diagnosis not present

## 2017-07-30 DIAGNOSIS — H524 Presbyopia: Secondary | ICD-10-CM | POA: Diagnosis not present

## 2017-08-04 DIAGNOSIS — Z0000 Encounter for general adult medical examination without abnormal findings: Secondary | ICD-10-CM | POA: Diagnosis not present

## 2017-08-04 DIAGNOSIS — R9431 Abnormal electrocardiogram [ECG] [EKG]: Secondary | ICD-10-CM | POA: Diagnosis not present

## 2017-08-04 DIAGNOSIS — E7849 Other hyperlipidemia: Secondary | ICD-10-CM | POA: Diagnosis not present

## 2017-08-04 DIAGNOSIS — R001 Bradycardia, unspecified: Secondary | ICD-10-CM | POA: Diagnosis not present

## 2017-08-08 DIAGNOSIS — Z1212 Encounter for screening for malignant neoplasm of rectum: Secondary | ICD-10-CM | POA: Diagnosis not present

## 2018-01-27 DIAGNOSIS — Z23 Encounter for immunization: Secondary | ICD-10-CM | POA: Diagnosis not present

## 2018-02-27 DIAGNOSIS — D692 Other nonthrombocytopenic purpura: Secondary | ICD-10-CM | POA: Diagnosis not present

## 2018-02-27 DIAGNOSIS — L821 Other seborrheic keratosis: Secondary | ICD-10-CM | POA: Diagnosis not present

## 2018-02-27 DIAGNOSIS — Z85828 Personal history of other malignant neoplasm of skin: Secondary | ICD-10-CM | POA: Diagnosis not present

## 2018-02-27 DIAGNOSIS — L570 Actinic keratosis: Secondary | ICD-10-CM | POA: Diagnosis not present

## 2018-07-22 DIAGNOSIS — H524 Presbyopia: Secondary | ICD-10-CM | POA: Diagnosis not present

## 2018-07-27 DIAGNOSIS — H2512 Age-related nuclear cataract, left eye: Secondary | ICD-10-CM | POA: Diagnosis not present

## 2018-07-27 DIAGNOSIS — H25812 Combined forms of age-related cataract, left eye: Secondary | ICD-10-CM | POA: Diagnosis not present

## 2018-07-27 DIAGNOSIS — H25012 Cortical age-related cataract, left eye: Secondary | ICD-10-CM | POA: Diagnosis not present

## 2018-08-20 DIAGNOSIS — H2511 Age-related nuclear cataract, right eye: Secondary | ICD-10-CM | POA: Diagnosis not present

## 2018-08-20 DIAGNOSIS — H25811 Combined forms of age-related cataract, right eye: Secondary | ICD-10-CM | POA: Diagnosis not present

## 2018-12-03 DIAGNOSIS — Z23 Encounter for immunization: Secondary | ICD-10-CM | POA: Diagnosis not present

## 2018-12-07 DIAGNOSIS — R82998 Other abnormal findings in urine: Secondary | ICD-10-CM | POA: Diagnosis not present

## 2018-12-07 DIAGNOSIS — E7849 Other hyperlipidemia: Secondary | ICD-10-CM | POA: Diagnosis not present

## 2018-12-07 DIAGNOSIS — Z125 Encounter for screening for malignant neoplasm of prostate: Secondary | ICD-10-CM | POA: Diagnosis not present

## 2018-12-10 DIAGNOSIS — Z1212 Encounter for screening for malignant neoplasm of rectum: Secondary | ICD-10-CM | POA: Diagnosis not present

## 2018-12-10 DIAGNOSIS — I517 Cardiomegaly: Secondary | ICD-10-CM | POA: Diagnosis not present

## 2018-12-10 DIAGNOSIS — R9431 Abnormal electrocardiogram [ECG] [EKG]: Secondary | ICD-10-CM | POA: Diagnosis not present

## 2018-12-10 DIAGNOSIS — Z0000 Encounter for general adult medical examination without abnormal findings: Secondary | ICD-10-CM | POA: Diagnosis not present

## 2018-12-10 DIAGNOSIS — N183 Chronic kidney disease, stage 3 (moderate): Secondary | ICD-10-CM | POA: Diagnosis not present

## 2018-12-16 ENCOUNTER — Other Ambulatory Visit: Payer: Self-pay | Admitting: Internal Medicine

## 2018-12-16 DIAGNOSIS — E785 Hyperlipidemia, unspecified: Secondary | ICD-10-CM

## 2018-12-28 ENCOUNTER — Ambulatory Visit
Admission: RE | Admit: 2018-12-28 | Discharge: 2018-12-28 | Disposition: A | Discharge status: Home / Self Care | Payer: Medicare Other | Source: Ambulatory Visit | Attending: Internal Medicine | Admitting: Internal Medicine

## 2018-12-28 DIAGNOSIS — E785 Hyperlipidemia, unspecified: Secondary | ICD-10-CM

## 2019-01-06 ENCOUNTER — Other Ambulatory Visit: Payer: Self-pay

## 2019-01-06 ENCOUNTER — Ambulatory Visit (HOSPITAL_COMMUNITY)
Admission: RE | Admit: 2019-01-06 | Discharge: 2019-01-06 | Disposition: A | Discharge status: Home / Self Care | Payer: Medicare Other | Source: Ambulatory Visit | Attending: Cardiology | Admitting: Cardiology

## 2019-01-06 VITALS — BP 118/60 | HR 62 | Wt 161.0 lb

## 2019-01-06 DIAGNOSIS — Z7982 Long term (current) use of aspirin: Secondary | ICD-10-CM | POA: Insufficient documentation

## 2019-01-06 DIAGNOSIS — E782 Mixed hyperlipidemia: Secondary | ICD-10-CM

## 2019-01-06 DIAGNOSIS — I2510 Atherosclerotic heart disease of native coronary artery without angina pectoris: Secondary | ICD-10-CM | POA: Insufficient documentation

## 2019-01-06 DIAGNOSIS — Z79899 Other long term (current) drug therapy: Secondary | ICD-10-CM | POA: Diagnosis not present

## 2019-01-06 DIAGNOSIS — E785 Hyperlipidemia, unspecified: Secondary | ICD-10-CM | POA: Diagnosis not present

## 2019-01-06 MED ORDER — RAMIPRIL 1.25 MG PO CAPS: 1.2500 mg | ORAL_CAPSULE | Freq: Every day | ORAL | 3 refills | Status: DC

## 2019-01-06 MED ORDER — ATORVASTATIN CALCIUM 40 MG PO TABS: 40.0000 mg | ORAL_TABLET | Freq: Every day | ORAL | 3 refills | Status: DC

## 2019-01-06 MED ORDER — ASPIRIN EC 81 MG PO TBEC: 81.0000 mg | DELAYED_RELEASE_TABLET | Freq: Every day | ORAL | 3 refills | Status: DC

## 2019-01-06 NOTE — Patient Instructions (Signed)
EKG done today.  START Aspirin 81mg  tab daily.  START Ramipril 1.25mg  Cap daily.  INCREASE Atorvastatin to 40mg  daily  Your physician has requested that you have a lexiscan myoview. For further information please visit [email or web address]. Please follow instruction sheet, as given. This will be done at our [facility] location.  Lab work will need to be done in 2 weeks and again in 2 months.  Please follow up with the [facility] in 4 months.  At the [facility], you and your health needs are our priority. As part of our continuing mission to provide you with exceptional heart care, we have created designated Provider Care Teams. These Care Teams include your primary Cardiologist (physician) and Advanced Practice Providers (APPs- Physician Assistants and Nurse Practitioners) who all work together to provide you with the care you need, when you need it.   You may see any of the following providers on your designated Care Team at your next follow up: Marland Kitchen Dr Glori Bickers . Dr Loralie Champagne . Darrick Grinder, NP . Lyda Jester, PA   Please be sure to bring in all your medications bottles to every appointment.

## 2019-01-06 NOTE — Progress Notes (Signed)
PCP: Rodrigo Ran, MD Cardiology: Dr. Shirlee Latch  73 y.o. with minimal past medical history self-referred due to an abnormal coronary calcium score done earlier this month.  Patient has always been in good health, no history of cardiac disease. His cholesterol has been borderline elevated, and he has been on atorvastatin 10 mg daily for several years, recently increased to 20 mg daily.  He has never smoked.  His parents and siblings have not had known coronary disease.  He is very active, plays golf 5-6 days/week and walks the course.  He plays tennis 2-3 times/week and is in several leagues.  He has excellent exercise tolerance and has never had chest pain or pressure.  No significant exertional dyspnea.  No lightheadedness or syncope. SBP runs < 130 when he checks at home.   He had a screening coronary calcium score done in 10/20.  This showed 1654 Agatston units, placing him in the 91st percentile for his age and gender.  Therefore, he decided to get a cardiology evaluation.    ECG (personally reviewed): NSR, normal.   Labs (9/20): LDL 93, HDL 71, K 4.8, creatinine 1.3  PMH: 1. Nephrolithiasis 2. Hyperlipidemia 3. CAD: Coronary calcium score scan in 10/20 with 1654 Agatston units, placing the patient in the 91st percentile for age/gender. - Echo (11/18): EF 55-60%, mild LVH.    SH: Married, retired Psychologist, occupational living in [place].  Occasional ETOH, never smoker.   FH: Father died with lymphoma, mother had dementia and died at 51.   ROS: All systems reviewed and negative except as per HPI.   Current Outpatient Medications  Medication Sig Dispense Refill  . atorvastatin (LIPITOR) 40 MG tablet Take 1 tablet (40 mg total) by mouth daily. 90 tablet 3  . Multiple Vitamin (MULTIVITAMIN WITH MINERALS) TABS tablet Take 1 tablet by mouth daily.    . saw palmetto 500 MG capsule Take 500 mg by mouth daily.    . tadalafil (CIALIS) 10 MG tablet Take 10 mg by mouth daily.    . Turmeric (QC TUMERIC COMPLEX)  500 MG CAPS Take by mouth 2 (two) times daily.    Marland Kitchen aspirin EC 81 MG tablet Take 1 tablet (81 mg total) by mouth daily. 90 tablet 3  . ramipril (ALTACE) 1.25 MG capsule Take 1 capsule (1.25 mg total) by mouth daily. 90 capsule 3   No current facility-administered medications for this encounter.    BP 118/60   Pulse 62   Wt 73 kg (161 lb)   SpO2 96%   BMI 23.78 kg/m  General: NAD Neck: No JVD, no thyromegaly or thyroid nodule.  Lungs: Clear to auscultation bilaterally with normal respiratory effort. CV: Nondisplaced PMI.  Heart regular S1/S2, no S3/S4, no murmur.  No peripheral edema.  No carotid bruit.  Normal pedal pulses.  Abdomen: Soft, nontender, no hepatosplenomegaly, no distention.  Skin: Intact without lesions or rashes.  Neurologic: Alert and oriented x 3.  Psych: Normal affect. Extremities: No clubbing or cyanosis.  HEENT: Normal.   Assessment/Plan: 1. CAD: Patient had a coronary calcium score placing him the 91st percentile for age and gender.  The absolute calcium score number suggests significant risk for obstructive coronary disease.  However, the patient seems to be truly asymptomatic.  We had a long discussion today about how to address his cardiac risk. ECG is normal.  With lack of symptoms, I cannot tell him that there would be any advantage to an invasive evaluation (cardiac cath) despite evidence for extensive plaque  in the coronary tree.  - I will arrange for ETT-Cardiolite (would avoid coronary CTA given extensive calcium and likely widespread blooming artifact).  If this is a low-intermediate risk study, in the absence of symptoms, I would continue medical management.  If this is a high risk study suggestive of extensive LAD ischemia or ischemia in multiple vascular territories, cardiac cath may be appropriate in order to detect left main or 3 vessel disease.  However, I think this is not highly likely with no symptoms.  - He will start ASA 81 mg daily.  I think  benefits in this situation likely outweigh the risk.  - For primary prevention of cardiac disease, I will start him on a low dose or ramipril, 1.25 mg daily.  If he tolerates this ok will titrate up to 2.5 mg daily.  BMET 2 wks.  - I will increase his atorvastatin to moderate dose, 40 mg daily.  Lipids/LFTs in 2 months.  - We discussed diet, I recommended a Mediterranean diet for him.  2. Hyperlipidemia: Last LDL 93, goal will be less than 70.   - I am going to increase atorvastatin to moderate dose, 40 mg daily.  Lipids/LFTs in 2 months.   If Cardiolite is low risk, he will return to see me in 4 months.  Loralie Champagne 01/06/2019

## 2019-01-09 ENCOUNTER — Other Ambulatory Visit (HOSPITAL_COMMUNITY)
Admission: RE | Admit: 2019-01-09 | Discharge: 2019-01-09 | Disposition: A | Discharge status: Home / Self Care | Payer: Medicare Other | Source: Ambulatory Visit | Attending: Cardiology | Admitting: Cardiology

## 2019-01-09 DIAGNOSIS — Z01812 Encounter for preprocedural laboratory examination: Secondary | ICD-10-CM | POA: Insufficient documentation

## 2019-01-09 DIAGNOSIS — Z20828 Contact with and (suspected) exposure to other viral communicable diseases: Secondary | ICD-10-CM | POA: Diagnosis not present

## 2019-01-10 LAB — NOVEL CORONAVIRUS, NAA (HOSP ORDER, SEND-OUT TO REF LAB; TAT 18-24 HRS): SARS-CoV-2, NAA: NOT DETECTED

## 2019-01-11 ENCOUNTER — Ambulatory Visit: Payer: Self-pay | Admitting: Cardiology

## 2019-01-11 ENCOUNTER — Telehealth (HOSPITAL_COMMUNITY): Payer: Self-pay | Admitting: *Deleted

## 2019-01-11 NOTE — Telephone Encounter (Signed)
Patient given detailed instructions per Myocardial Perfusion Study Information Sheet for the test on 01/13/19. Patient notified to arrive 15 minutes early and that it is imperative to arrive on time for appointment to keep from having the test rescheduled.  If you need to cancel or reschedule your appointment, please call the office within 24 hours of your appointment. . Patient verbalized understanding.Walter George    

## 2019-01-11 NOTE — Telephone Encounter (Signed)
Patient given detailed instructions per Myocardial Perfusion Study Information Sheet for the test on 01/13/19. Patient notified to arrive 15 minutes early and that it is imperative to arrive on time for appointment to keep from having the test rescheduled.  If you need to cancel or reschedule your appointment, please call the office within 24 hours of your appointment. . Patient verbalized understanding.Walter George Jacqueline    

## 2019-01-13 ENCOUNTER — Ambulatory Visit (HOSPITAL_COMMUNITY): Payer: Medicare Other | Attending: Cardiology

## 2019-01-13 ENCOUNTER — Other Ambulatory Visit: Payer: Self-pay

## 2019-01-13 DIAGNOSIS — I2510 Atherosclerotic heart disease of native coronary artery without angina pectoris: Secondary | ICD-10-CM | POA: Diagnosis not present

## 2019-01-13 LAB — MYOCARDIAL PERFUSION IMAGING
Estimated workload: 16.2 METS
Exercise duration (min): 13 min
Exercise duration (sec): 31 s
LV dias vol: 101 mL (ref 62–150)
LV sys vol: 39 mL
MPHR: 149 {beats}/min
Peak HR: 136 {beats}/min
Percent HR: 91 %
RPE: 18
Rest HR: 40 {beats}/min
SDS: 1
SRS: 0
SSS: 1
TID: 0.91

## 2019-01-13 MED ORDER — TECHNETIUM TC 99M TETROFOSMIN IV KIT
31.6000 | PACK | Freq: Once | INTRAVENOUS | Status: AC | PRN
  Administered 2019-01-13: 31.6 via INTRAVENOUS
  Filled 2019-01-13: qty 32

## 2019-01-13 MED ORDER — TECHNETIUM TC 99M TETROFOSMIN IV KIT
10.4000 | PACK | Freq: Once | INTRAVENOUS | Status: AC | PRN
  Administered 2019-01-13: 10.4 via INTRAVENOUS
  Filled 2019-01-13: qty 11

## 2019-01-13 MED ORDER — REGADENOSON 0.4 MG/5ML IV SOLN
0.4000 mg | Freq: Once | INTRAVENOUS | Status: AC
  Administered 2019-01-13: 0.4 mg via INTRAVENOUS

## 2019-01-14 ENCOUNTER — Telehealth (HOSPITAL_COMMUNITY): Payer: Self-pay

## 2019-01-14 DIAGNOSIS — I2510 Atherosclerotic heart disease of native coronary artery without angina pectoris: Secondary | ICD-10-CM

## 2019-01-14 NOTE — Telephone Encounter (Signed)
-----   Message from Larey Dresser, MD sent at 01/14/2019  2:03 PM EDT ----- Normal study.  Please let patient know.  Continue medical management. Excellent exercise capacity suggests good prognosis.

## 2019-01-14 NOTE — Telephone Encounter (Signed)
Pt aware of results and recommendation to continue medical management. Verbalized understanding.

## 2019-01-20 ENCOUNTER — Ambulatory Visit (HOSPITAL_COMMUNITY)
Admission: RE | Admit: 2019-01-20 | Discharge: 2019-01-20 | Disposition: A | Discharge status: Home / Self Care | Payer: Medicare Other | Source: Ambulatory Visit | Attending: Cardiology | Admitting: Cardiology

## 2019-01-20 ENCOUNTER — Other Ambulatory Visit: Payer: Self-pay

## 2019-01-20 ENCOUNTER — Other Ambulatory Visit (HOSPITAL_COMMUNITY): Payer: Self-pay

## 2019-01-20 DIAGNOSIS — I2510 Atherosclerotic heart disease of native coronary artery without angina pectoris: Secondary | ICD-10-CM

## 2019-01-20 LAB — BASIC METABOLIC PANEL
Anion gap: 8 (ref 5–15)
BUN: 19 mg/dL (ref 8–23)
CO2: 25 mmol/L (ref 22–32)
Calcium: 8.9 mg/dL (ref 8.9–10.3)
Chloride: 108 mmol/L (ref 98–111)
Creatinine, Ser: 1.13 mg/dL (ref 0.61–1.24)
GFR calc Af Amer: >60 mL/min (ref >60)
GFR calc non Af Amer: >60 mL/min (ref >60)
Glucose, Bld: 75 mg/dL (ref 70–99)
Potassium: 4.8 mmol/L (ref 3.5–5.1)
Sodium: 141 mmol/L (ref 135–145)

## 2019-03-02 DIAGNOSIS — L821 Other seborrheic keratosis: Secondary | ICD-10-CM | POA: Diagnosis not present

## 2019-03-02 DIAGNOSIS — Z85828 Personal history of other malignant neoplasm of skin: Secondary | ICD-10-CM | POA: Diagnosis not present

## 2019-03-02 DIAGNOSIS — L738 Other specified follicular disorders: Secondary | ICD-10-CM | POA: Diagnosis not present

## 2019-03-02 DIAGNOSIS — L812 Freckles: Secondary | ICD-10-CM | POA: Diagnosis not present

## 2019-03-08 ENCOUNTER — Other Ambulatory Visit (HOSPITAL_COMMUNITY): Payer: Self-pay | Admitting: *Deleted

## 2019-03-08 ENCOUNTER — Other Ambulatory Visit: Payer: Self-pay

## 2019-03-08 ENCOUNTER — Ambulatory Visit (HOSPITAL_COMMUNITY)
Admission: RE | Admit: 2019-03-08 | Discharge: 2019-03-08 | Disposition: A | Discharge status: Home / Self Care | Payer: Medicare Other | Source: Ambulatory Visit | Attending: Cardiology | Admitting: Cardiology

## 2019-03-08 DIAGNOSIS — E782 Mixed hyperlipidemia: Secondary | ICD-10-CM

## 2019-03-08 DIAGNOSIS — I2510 Atherosclerotic heart disease of native coronary artery without angina pectoris: Secondary | ICD-10-CM | POA: Insufficient documentation

## 2019-03-08 LAB — HEPATIC FUNCTION PANEL
ALT: 19 U/L (ref 0–44)
AST: 32 U/L (ref 15–41)
Albumin: 3.7 g/dL (ref 3.5–5.0)
Alkaline Phosphatase: 73 U/L (ref 38–126)
Bilirubin, Direct: 0.3 mg/dL — ABNORMAL HIGH (ref 0.0–0.2)
Indirect Bilirubin: 1 mg/dL — ABNORMAL HIGH (ref 0.3–0.9)
Total Bilirubin: 1.3 mg/dL — ABNORMAL HIGH (ref 0.3–1.2)
Total Protein: 5.8 g/dL — ABNORMAL LOW (ref 6.5–8.1)

## 2019-03-08 LAB — LIPID PANEL
Cholesterol: 138 mg/dL (ref 0–200)
HDL: 63 mg/dL (ref >40)
LDL Cholesterol: 63 mg/dL (ref 0–99)
Total CHOL/HDL Ratio: 2.2 RATIO
Triglycerides: 59 mg/dL (ref <150)
VLDL: 12 mg/dL (ref 0–40)

## 2019-03-08 NOTE — Progress Notes (Signed)
Lipid/lft orders placed per Dr Aundra Dubin

## 2019-03-19 ENCOUNTER — Encounter (HOSPITAL_COMMUNITY): Payer: Self-pay

## 2019-03-22 ENCOUNTER — Encounter (HOSPITAL_COMMUNITY): Payer: Self-pay

## 2019-04-07 ENCOUNTER — Ambulatory Visit: Payer: Medicare Other | Attending: Internal Medicine

## 2019-04-07 DIAGNOSIS — Z23 Encounter for immunization: Secondary | ICD-10-CM | POA: Insufficient documentation

## 2019-04-07 NOTE — Progress Notes (Signed)
   Covid-19 Vaccination Clinic  Name:  ZAILYN ROWSER    MRN: [id number] DOB: Aug 15, 1945  04/07/2019  Mr. Uselman was observed post Covid-19 immunization for 15 minutes without incidence. He was provided with Vaccine Information Sheet and instruction to access the V-Safe system.   Mr. Bell was instructed to call 911 with any severe reactions post vaccine: Marland Kitchen Difficulty breathing  . Swelling of your face and throat  . A fast heartbeat  . A bad rash all over your body  . Dizziness and weakness    Immunizations Administered    Name Date Dose VIS Date Route   Pfizer COVID-19 Vaccine 04/07/2019  1:25 PM 0.3 mL 02/26/2019 Intramuscular   Manufacturer: [employer], [employer]   Lot: [id number]   NDC: [id number]

## 2019-04-21 ENCOUNTER — Ambulatory Visit: Payer: Medicare Other

## 2019-04-21 DIAGNOSIS — Z85828 Personal history of other malignant neoplasm of skin: Secondary | ICD-10-CM | POA: Diagnosis not present

## 2019-04-21 DIAGNOSIS — L821 Other seborrheic keratosis: Secondary | ICD-10-CM | POA: Diagnosis not present

## 2019-04-25 ENCOUNTER — Ambulatory Visit: Payer: Medicare Other | Attending: Internal Medicine

## 2019-04-25 DIAGNOSIS — Z23 Encounter for immunization: Secondary | ICD-10-CM | POA: Insufficient documentation

## 2019-04-25 NOTE — Progress Notes (Signed)
   Covid-19 Vaccination Clinic  Name:  Walter George    MRN: [id number] DOB: 1945-05-29  04/25/2019  [name] was observed post Covid-19 immunization for 15 minutes without incidence. He was provided with Vaccine Information Sheet and instruction to access the V-Safe system.   [name] was instructed to call 911 with any severe reactions post vaccine: Marland Kitchen Difficulty breathing  . Swelling of your face and throat  . A fast heartbeat  . A bad rash all over your body  . Dizziness and weakness    Immunizations Administered    Name Date Dose VIS Date Route   Pfizer COVID-19 Vaccine 04/25/2019 12:03 PM 0.3 mL 02/26/2019 Intramuscular   Manufacturer: [employer], [employer]   Lot: [id number]   NDC: [id number]

## 2019-05-14 ENCOUNTER — Ambulatory Visit (HOSPITAL_COMMUNITY)
Admission: RE | Admit: 2019-05-14 | Discharge: 2019-05-14 | Disposition: A | Discharge status: Home / Self Care | Payer: Medicare Other | Source: Ambulatory Visit | Attending: Cardiology | Admitting: Cardiology

## 2019-05-14 ENCOUNTER — Encounter (HOSPITAL_COMMUNITY): Payer: Self-pay | Admitting: Cardiology

## 2019-05-14 ENCOUNTER — Other Ambulatory Visit: Payer: Self-pay

## 2019-05-14 VITALS — BP 136/70 | HR 47 | Wt 163.2 lb

## 2019-05-14 DIAGNOSIS — Z7982 Long term (current) use of aspirin: Secondary | ICD-10-CM | POA: Diagnosis not present

## 2019-05-14 DIAGNOSIS — Z79899 Other long term (current) drug therapy: Secondary | ICD-10-CM | POA: Insufficient documentation

## 2019-05-14 DIAGNOSIS — E785 Hyperlipidemia, unspecified: Secondary | ICD-10-CM | POA: Diagnosis not present

## 2019-05-14 DIAGNOSIS — I2510 Atherosclerotic heart disease of native coronary artery without angina pectoris: Secondary | ICD-10-CM | POA: Insufficient documentation

## 2019-05-14 MED ORDER — ATORVASTATIN CALCIUM 80 MG PO TABS: 80.0000 mg | ORAL_TABLET | Freq: Every day | ORAL | 3 refills | Status: DC

## 2019-05-14 NOTE — Patient Instructions (Signed)
INCREASE Atorvastatin to 80mg  (1 tab) daily   Labs (cholesterol) in 2 months : Monday April 26th, 2021 at 10:00 AM. Garage code [id number] We will only contact you if something comes back abnormal or we need to make some changes. Otherwise no news is good news!   Your physician recommends that you schedule a follow-up appointment in: 1 year with Dr [id number]. Our office will call you to schedule your appointment closer to 1 year.  Please call Shirlee Latch if you do not get this call.    Please call office at [phone] option 2 if you have any questions or concerns.    At the [facility], you and your health needs are our priority. As part of our continuing mission to provide you with exceptional heart care, we have created designated Provider Care Teams. These Care Teams include your primary Cardiologist (physician) and Advanced Practice Providers (APPs- Physician Assistants and Nurse Practitioners) who all work together to provide you with the care you need, when you need it.   You may see any of the following providers on your designated Care Team at your next follow up: [phone] Dr Marland Kitchen . Dr Arvilla Meres . Marca Ancona, NP . Tonye Becket, PA . Robbie Lis, PharmD   Please be sure to bring in all your medications bottles to every appointment.

## 2019-05-16 NOTE — Progress Notes (Signed)
PCP: Rodrigo Ran, MD Cardiology: Dr. Shirlee Latch  74 y.o. with hyperlipidemia and abnormal calcium score scan presents for followup of CAD risk.  He had a screening coronary calcium score done in 10/20.  This showed 1654 Agatston units, placing him in the 91st percentile for his age and gender.  He is now on atorvastatin. Cardiolite in 10/20 showed no ischemia or infarction.   He has been doing well overall.  Plays tennis and golf regularly.  No chest pain/tightness.  No exertional dyspnea.  No problems with statin.   Labs (9/20): LDL 93, HDL 71, K 4.8, creatinine 1.3 Labs (11/20): K 4.8, creatinine 1.13 Labs (12/20): LDL 63, HDL 63  PMH: 1. Nephrolithiasis 2. Hyperlipidemia 3. CAD: Coronary calcium score scan in 10/20 with 1654 Agatston units, placing the patient in the 91st percentile for age/gender. - Echo (11/18): EF 55-60%, mild LVH.   - ETT-Cardiolite (10/20) with 16 METS, no significant ST changes, EF 61%, no ischemia/infarction.   SH: Married, retired Psychologist, occupational living in [place].  Occasional ETOH, never smoker.   FH: Father died with lymphoma, mother had dementia and died at 51.   ROS: All systems reviewed and negative except as per HPI.   Current Outpatient Medications  Medication Sig Dispense Refill  . aspirin EC 81 MG tablet Take 1 tablet (81 mg total) by mouth daily. 90 tablet 3  . atorvastatin (LIPITOR) 80 MG tablet Take 1 tablet (80 mg total) by mouth daily. 90 tablet 3  . Multiple Vitamin (MULTIVITAMIN WITH MINERALS) TABS tablet Take 1 tablet by mouth daily.    . ramipril (ALTACE) 1.25 MG capsule Take 1 capsule (1.25 mg total) by mouth daily. 90 capsule 3  . saw palmetto 500 MG capsule Take 500 mg by mouth daily.    . tadalafil (CIALIS) 10 MG tablet Take 10 mg by mouth daily.    . Turmeric (QC TUMERIC COMPLEX) 500 MG CAPS Take by mouth daily.      No current facility-administered medications for this encounter.   BP 136/70   Pulse (!) 47   Wt 74 kg (163 lb 3.2 oz)    SpO2 100%   BMI 24.10 kg/m  General: NAD Neck: No JVD, no thyromegaly or thyroid nodule.  Lungs: Clear to auscultation bilaterally with normal respiratory effort. CV: Nondisplaced PMI.  Heart regular S1/S2, no S3/S4, no murmur.  No peripheral edema.  No carotid bruit.  Normal pedal pulses.  Abdomen: Soft, nontender, no hepatosplenomegaly, no distention.  Skin: Intact without lesions or rashes.  Neurologic: Alert and oriented x 3.  Psych: Normal affect. Extremities: No clubbing or cyanosis.  HEENT: Normal.    Assessment/Plan: 1. CAD: Patient had a coronary calcium score placing him the 91st percentile for age and gender.  The absolute calcium score number suggests significant risk for obstructive coronary disease.  ETT-Cardiolite in 10/20 was low risk with no ischemia/infarction on perfusion images and excellent exercise tolerance.  - Continue aspirin 81 mg daily.   - Continue ramipril for primary prevention of cardiac disease.  - Goal LDL < 50, will increase atorvastatin to 80 mg daily.  Lipids/LFTs in 2 months.  2. Hyperlipidemia: Goal LDL < 50 ideally.    - As above, increase atorvastatin to 80 mg daily with lipids/LFTs in 2 months.   Followup in 1 year.   Marca Ancona 05/16/2019

## 2019-07-12 ENCOUNTER — Other Ambulatory Visit (HOSPITAL_COMMUNITY): Payer: Medicare Other

## 2019-07-15 ENCOUNTER — Ambulatory Visit (HOSPITAL_COMMUNITY)
Admission: RE | Admit: 2019-07-15 | Discharge: 2019-07-15 | Disposition: A | Discharge status: Home / Self Care | Payer: Medicare Other | Source: Ambulatory Visit | Attending: Cardiology | Admitting: Cardiology

## 2019-07-15 ENCOUNTER — Other Ambulatory Visit: Payer: Self-pay

## 2019-07-15 ENCOUNTER — Other Ambulatory Visit (HOSPITAL_COMMUNITY): Payer: Self-pay | Admitting: *Deleted

## 2019-07-15 DIAGNOSIS — I2510 Atherosclerotic heart disease of native coronary artery without angina pectoris: Secondary | ICD-10-CM | POA: Insufficient documentation

## 2019-07-15 DIAGNOSIS — E782 Mixed hyperlipidemia: Secondary | ICD-10-CM

## 2019-07-15 LAB — HEPATIC FUNCTION PANEL
ALT: 17 U/L (ref 0–44)
AST: 34 U/L (ref 15–41)
Albumin: 3.9 g/dL (ref 3.5–5.0)
Alkaline Phosphatase: 78 U/L (ref 38–126)
Bilirubin, Direct: 0.3 mg/dL — ABNORMAL HIGH (ref 0.0–0.2)
Indirect Bilirubin: 1.5 mg/dL — ABNORMAL HIGH (ref 0.3–0.9)
Total Bilirubin: 1.8 mg/dL — ABNORMAL HIGH (ref 0.3–1.2)
Total Protein: 6.4 g/dL — ABNORMAL LOW (ref 6.5–8.1)

## 2019-07-15 LAB — LIPID PANEL
Cholesterol: 147 mg/dL (ref 0–200)
HDL: 74 mg/dL (ref >40)
LDL Cholesterol: 64 mg/dL (ref 0–99)
Total CHOL/HDL Ratio: 2 RATIO
Triglycerides: 46 mg/dL (ref <150)
VLDL: 9 mg/dL (ref 0–40)

## 2019-11-04 ENCOUNTER — Telehealth (HOSPITAL_COMMUNITY): Payer: Self-pay

## 2019-11-04 NOTE — Telephone Encounter (Signed)
Patient called requesting that all ov notes,results etc be faxed to his pcp,Dr. Waynard Edwards to [phone]. Records were successfully faxed over. Patient was very appreciative

## 2019-12-06 DIAGNOSIS — E785 Hyperlipidemia, unspecified: Secondary | ICD-10-CM | POA: Diagnosis not present

## 2019-12-06 DIAGNOSIS — I2510 Atherosclerotic heart disease of native coronary artery without angina pectoris: Secondary | ICD-10-CM | POA: Diagnosis not present

## 2019-12-06 DIAGNOSIS — Z125 Encounter for screening for malignant neoplasm of prostate: Secondary | ICD-10-CM | POA: Diagnosis not present

## 2019-12-07 DIAGNOSIS — S61214A Laceration without foreign body of right ring finger without damage to nail, initial encounter: Secondary | ICD-10-CM | POA: Diagnosis not present

## 2019-12-11 IMAGING — CT CT HEART SCORING
3 series · 14 of 20 positions shown, 16 images · non-contrast
Comparison: No priors.

CLINICAL DATA: 72-year-old Caucasian male with history of
hyperlipidemia.

EXAM:
CT HEART FOR CALCIUM SCORING
TECHNIQUE: CT heart was performed using prospective ECG gating.
A non-contrast exam for calcium scoring was performed.
Note that this exam targets the heart and the chest was not imaged
in its entirety.

[Series 2: calcium scoring 2.00 qr36 bestdiast 54% · axial · 0.43mm/px · z∈[+1655,+1763]mm · 4 of 90 slices shown]
[im 18/90  vessel]
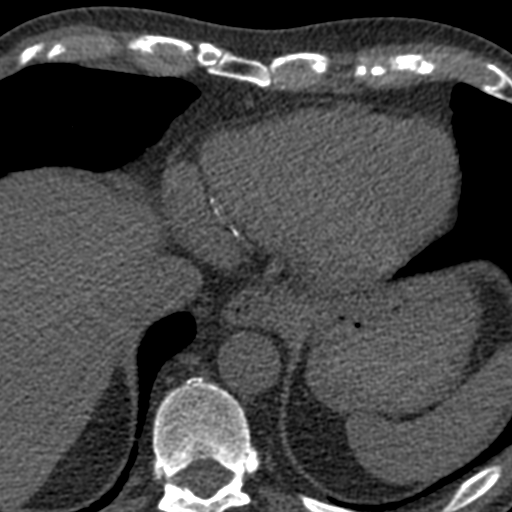
[im 36/90  vessel]
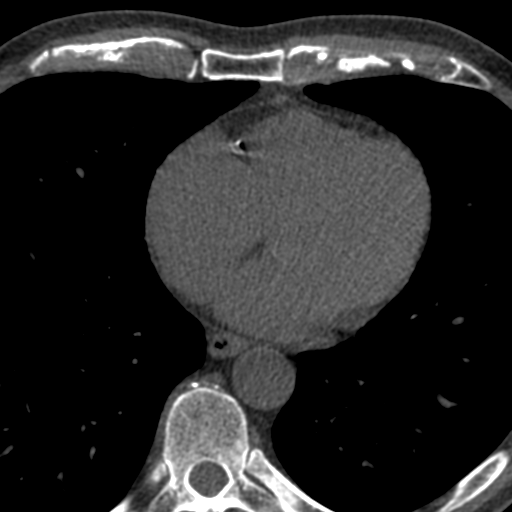
[im 54/90  vessel]
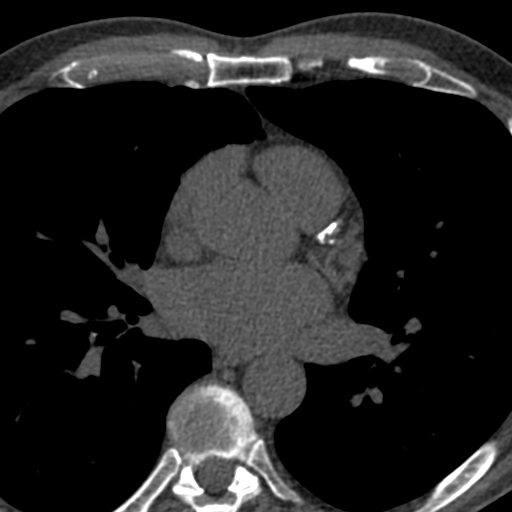
[im 72/90  vessel]
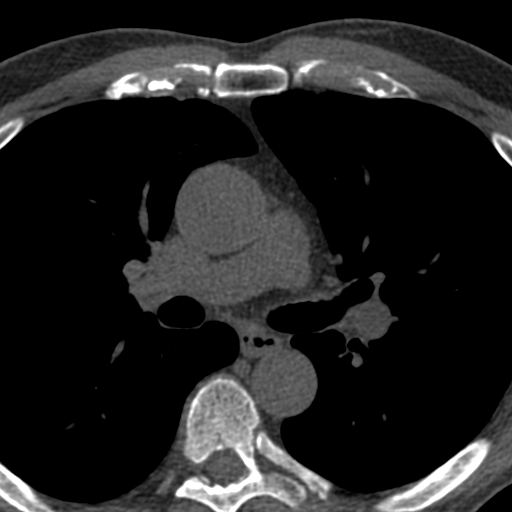

[Series 3: calcium scoring 2.00 br40 bestdiast 54% fov · axial · 0.62mm/px · z∈[+1649,+1769]mm · 5 of 90 slices shown, 7 images]
[im 15/90  vessel]
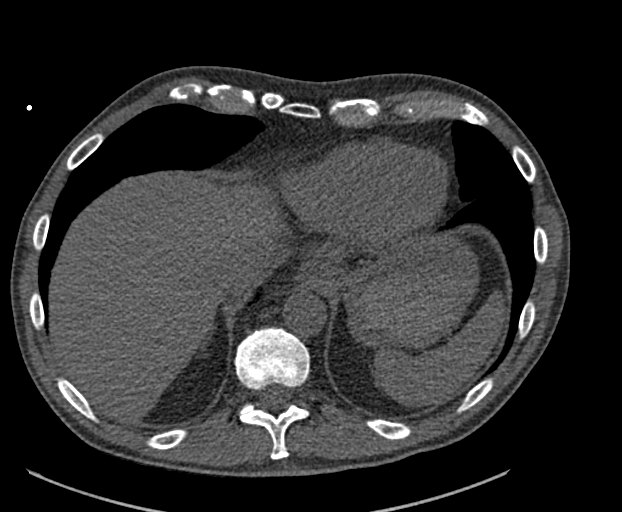
[im 15/90  lung]
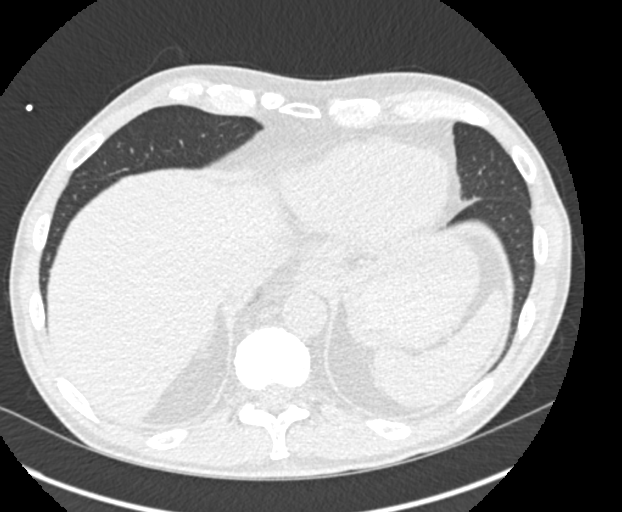
[im 30/90  vessel]
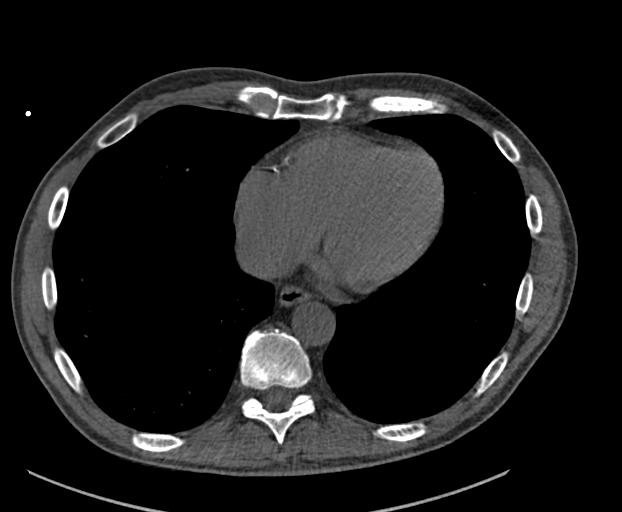
[im 45/90  vessel]
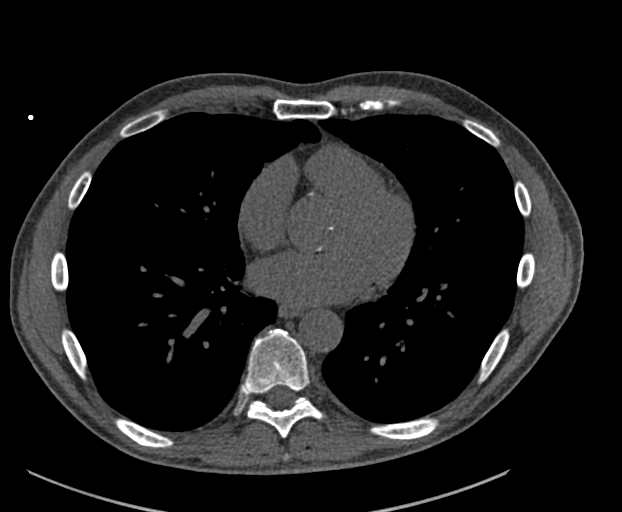
[im 60/90  vessel]
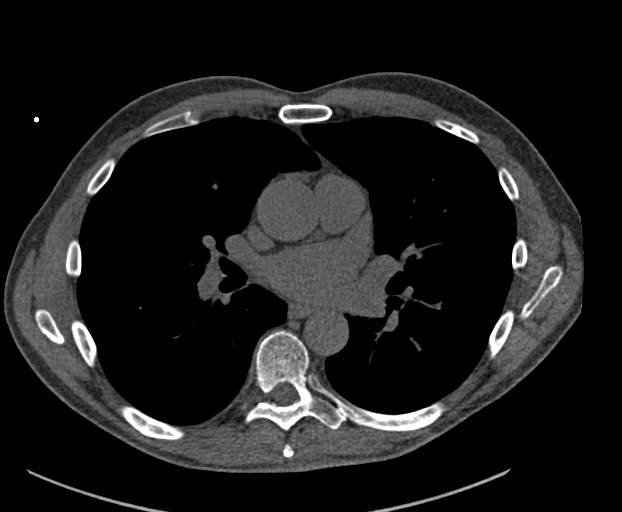
[im 75/90  vessel]
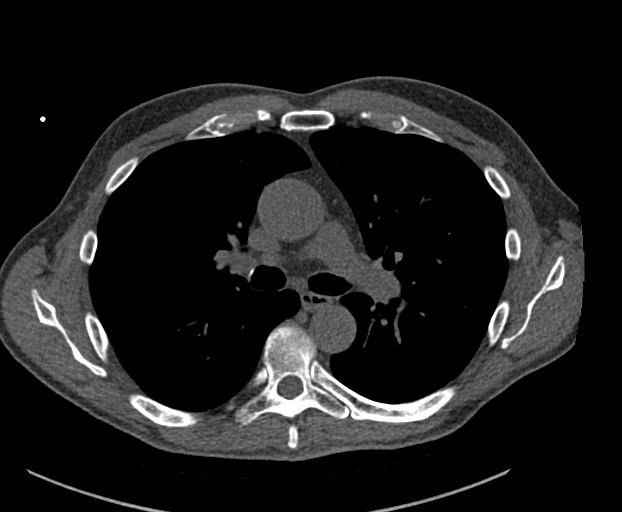
[im 75/90  lung]
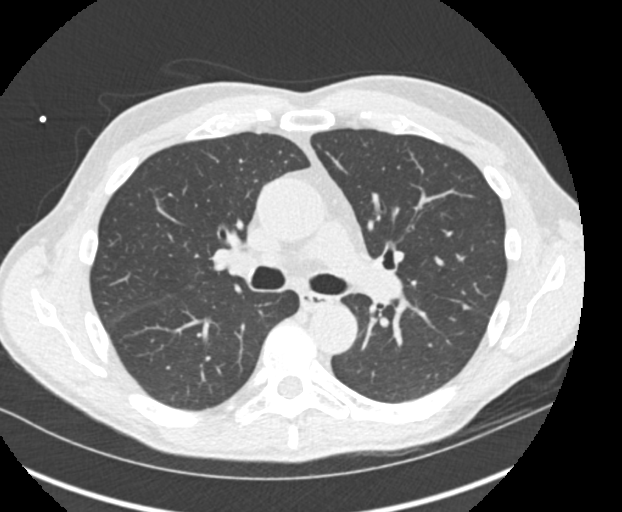

[Series 9: calcium scoring 2.00 br60 bestdiast 54% fov · axial · 0.62mm/px · z∈[+1649,+1769]mm · 5 of 90 slices shown]
[im 15/90  vessel]
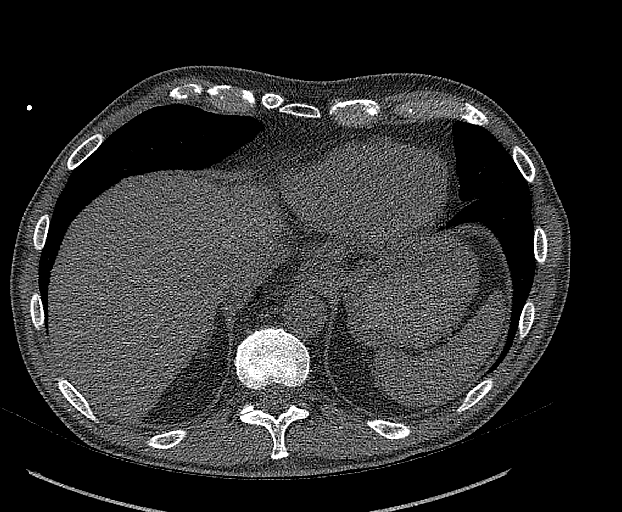
[im 30/90  vessel]
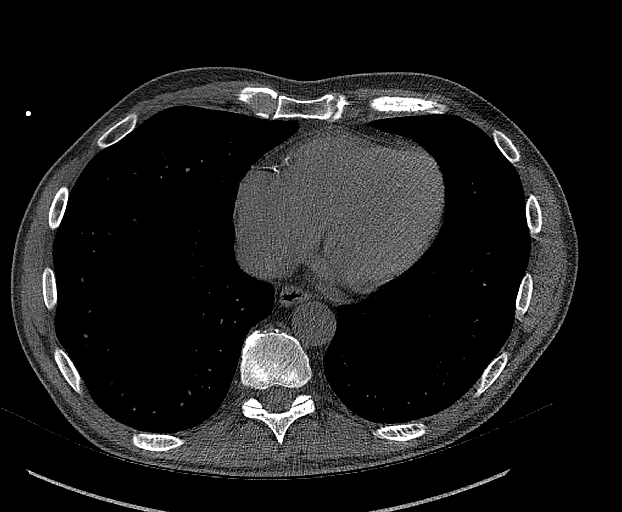
[im 45/90  vessel]
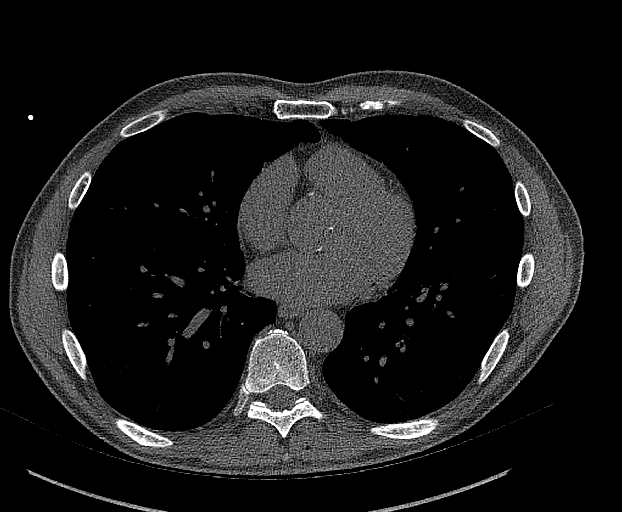
[im 60/90  vessel]
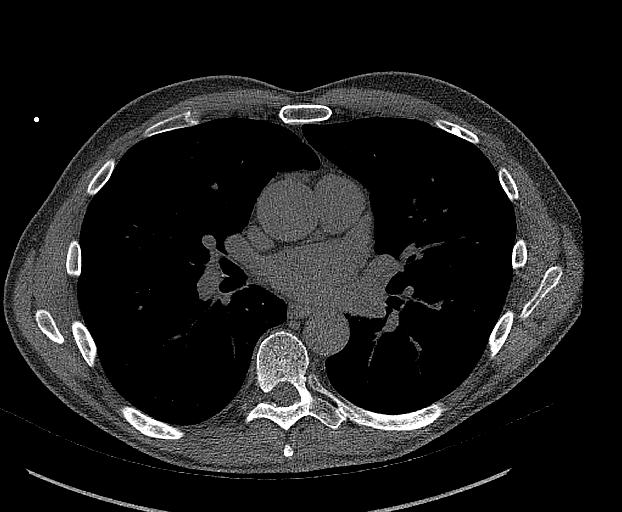
[im 75/90  vessel]
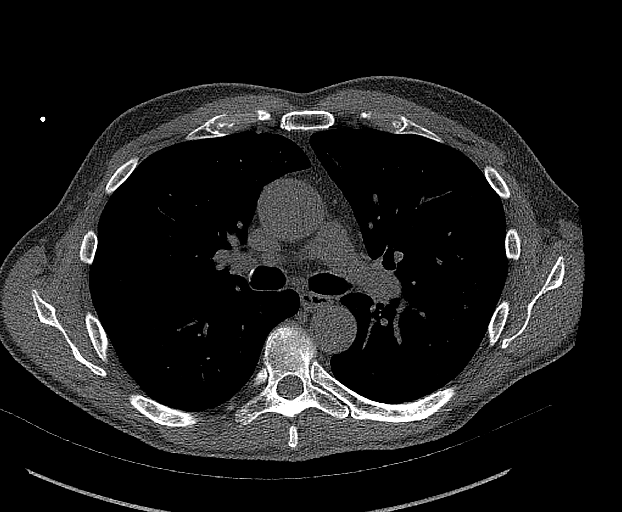

[14 of 20 positions shown; findings below may reference images not displayed]

FINDINGS: Technical quality: Good.

CORONARY CALCIUM

Total Agatston Score: 1,654

[HOSPITAL] percentile:  91st

OTHER FINDINGS:

2 mm pulmonary nodule in the lateral segment of the right middle
lobe (axial image 38 of series 9). Within the visualized portions of
the thorax there are no other larger more suspicious appearing
pulmonary nodules or masses, there is no acute consolidative
airspace disease, no pleural effusions, no pneumothorax and no
lymphadenopathy. Visualized portions of the upper abdomen are
unremarkable. There are no aggressive appearing lytic or blastic
lesions noted in the visualized portions of the skeleton.
IMPRESSION: 1. Patient's total coronary artery calcium score is 1,654 which is
91st percentile for patient's of matched age, gender and
race/ethnicity. Please note that although the presence of coronary
artery calcium documents the presence of coronary artery disease,
the severity of this disease and any potential stenosis cannot be
assessed on this noncontrast CT examination. Assessment for
potential risk factor modification, dietary therapy or pharmacologic
therapy may be warranted, if clinically indicated.
2. 2 mm pulmonary nodule in the lateral segment of the right middle
lobe, nonspecific, but statistically likely benign. No follow-up
needed if patient is low-risk. Non-contrast chest CT can be
considered in 12 months if patient is high-risk. This recommendation
follows the consensus statement: Guidelines for Management of
Incidental Pulmonary Nodules Detected on CT Images: From the

## 2019-12-14 DIAGNOSIS — I2510 Atherosclerotic heart disease of native coronary artery without angina pectoris: Secondary | ICD-10-CM | POA: Diagnosis not present

## 2019-12-14 DIAGNOSIS — N401 Enlarged prostate with lower urinary tract symptoms: Secondary | ICD-10-CM | POA: Diagnosis not present

## 2019-12-14 DIAGNOSIS — Z1212 Encounter for screening for malignant neoplasm of rectum: Secondary | ICD-10-CM | POA: Diagnosis not present

## 2019-12-14 DIAGNOSIS — Z0000 Encounter for general adult medical examination without abnormal findings: Secondary | ICD-10-CM | POA: Diagnosis not present

## 2019-12-14 DIAGNOSIS — E7849 Other hyperlipidemia: Secondary | ICD-10-CM | POA: Diagnosis not present

## 2019-12-17 DIAGNOSIS — Z4802 Encounter for removal of sutures: Secondary | ICD-10-CM | POA: Diagnosis not present

## 2019-12-17 DIAGNOSIS — S61314A Laceration without foreign body of right ring finger with damage to nail, initial encounter: Secondary | ICD-10-CM | POA: Diagnosis not present

## 2019-12-17 DIAGNOSIS — N529 Male erectile dysfunction, unspecified: Secondary | ICD-10-CM | POA: Diagnosis not present

## 2019-12-22 DIAGNOSIS — E785 Hyperlipidemia, unspecified: Secondary | ICD-10-CM | POA: Diagnosis not present

## 2020-01-05 ENCOUNTER — Other Ambulatory Visit: Payer: Self-pay

## 2020-01-05 MED ORDER — RAMIPRIL 1.25 MG PO CAPS: 1.2500 mg | ORAL_CAPSULE | Freq: Every day | ORAL | 3 refills | Status: DC

## 2020-01-05 NOTE — Telephone Encounter (Signed)
This is a CHF pt, Dr. Mclean 

## 2020-01-12 ENCOUNTER — Other Ambulatory Visit (HOSPITAL_COMMUNITY): Payer: Self-pay | Admitting: *Deleted

## 2020-01-14 ENCOUNTER — Telehealth (HOSPITAL_COMMUNITY): Payer: Self-pay | Admitting: Cardiology

## 2020-01-14 MED ORDER — RAMIPRIL 1.25 MG PO CAPS: 1.2500 mg | ORAL_CAPSULE | Freq: Every day | ORAL | 3 refills | Status: DC

## 2020-01-14 NOTE — Telephone Encounter (Signed)
Pt request ramipril refill, please send script to [facility], pt can be reached @[phone] . Thanks

## 2020-01-14 NOTE — Telephone Encounter (Signed)
PT AWARE  

## 2020-01-17 ENCOUNTER — Other Ambulatory Visit (HOSPITAL_COMMUNITY): Payer: Self-pay | Admitting: *Deleted

## 2020-01-21 ENCOUNTER — Telehealth (HOSPITAL_COMMUNITY): Payer: Self-pay | Admitting: Cardiology

## 2020-01-21 NOTE — Telephone Encounter (Signed)
Pt stated that pharmacy needs prior auth for Ramipril, Pharmacy stated forms was faxed over for thi s auth, pt can be reached @[phone] , please advise

## 2020-01-24 ENCOUNTER — Telehealth (HOSPITAL_COMMUNITY): Payer: Self-pay | Admitting: Cardiology

## 2020-01-24 ENCOUNTER — Telehealth (HOSPITAL_COMMUNITY): Payer: Self-pay | Admitting: Pharmacy Technician

## 2020-01-24 NOTE — Telephone Encounter (Signed)
Routed to [facility].

## 2020-01-24 NOTE — Telephone Encounter (Signed)
Pt called to f/u with ramipril  refill request, he stated he is out of  meds. Please advise

## 2020-01-24 NOTE — Telephone Encounter (Signed)
Received a message that PA was needed for Ramipril. When I ran a test claim, co-pay came back at $0.  Called patient's pharmacy, they did not receive the RX that was sent in on 10/29. Provided verbal information.   Called and updated the patient.  Archer Asa, CPhT

## 2020-02-24 DIAGNOSIS — L812 Freckles: Secondary | ICD-10-CM | POA: Diagnosis not present

## 2020-02-24 DIAGNOSIS — L821 Other seborrheic keratosis: Secondary | ICD-10-CM | POA: Diagnosis not present

## 2020-02-24 DIAGNOSIS — D1801 Hemangioma of skin and subcutaneous tissue: Secondary | ICD-10-CM | POA: Diagnosis not present

## 2020-02-24 DIAGNOSIS — Z85828 Personal history of other malignant neoplasm of skin: Secondary | ICD-10-CM | POA: Diagnosis not present

## 2020-03-31 ENCOUNTER — Encounter (HOSPITAL_COMMUNITY): Payer: Self-pay | Admitting: Cardiology

## 2020-03-31 ENCOUNTER — Ambulatory Visit (HOSPITAL_COMMUNITY)
Admission: RE | Admit: 2020-03-31 | Discharge: 2020-03-31 | Disposition: A | Discharge status: Home / Self Care | Payer: Medicare Other | Source: Ambulatory Visit | Attending: Cardiology | Admitting: Cardiology

## 2020-03-31 ENCOUNTER — Other Ambulatory Visit: Payer: Self-pay

## 2020-03-31 VITALS — BP 138/82 | HR 52 | Wt 167.6 lb

## 2020-03-31 DIAGNOSIS — Z7982 Long term (current) use of aspirin: Secondary | ICD-10-CM | POA: Insufficient documentation

## 2020-03-31 DIAGNOSIS — Z79899 Other long term (current) drug therapy: Secondary | ICD-10-CM | POA: Insufficient documentation

## 2020-03-31 DIAGNOSIS — E785 Hyperlipidemia, unspecified: Secondary | ICD-10-CM

## 2020-03-31 DIAGNOSIS — I2510 Atherosclerotic heart disease of native coronary artery without angina pectoris: Secondary | ICD-10-CM | POA: Insufficient documentation

## 2020-03-31 DIAGNOSIS — Z8249 Family history of ischemic heart disease and other diseases of the circulatory system: Secondary | ICD-10-CM | POA: Insufficient documentation

## 2020-03-31 HISTORY — DX: Heart failure, unspecified: I50.9

## 2020-03-31 MED ORDER — EZETIMIBE 10 MG PO TABS: 10.0000 mg | ORAL_TABLET | Freq: Every day | ORAL | 3 refills | Status: DC

## 2020-03-31 NOTE — Progress Notes (Signed)
Patient is being seen in the [facility]. VS, EKG, and device interrogations performed in the clinic. Dr McLean is at home and seeing patients via telemedicine as he is in quarantine.   

## 2020-03-31 NOTE — Patient Instructions (Addendum)
No Labs done today.   START Zetia 10mg  (1 tablet) by mouth daily.  No other medication changes were made. Please continue all current medications as prescribed.  Your physician recommends that you schedule a follow-up appointment in: 2 months for a lab only appointment and in 1 year with Dr. . Please contact our office in December for a January 2023 appointment.    If you have any questions or concerns before your next appointment please send February 2023 a message through [place] or call our office at [phone].    TO LEAVE A MESSAGE FOR THE NURSE SELECT OPTION 2, PLEASE LEAVE A MESSAGE INCLUDING: . YOUR NAME . DATE OF BIRTH . CALL BACK NUMBER . REASON FOR CALL**this is important as we prioritize the call backs  YOU WILL RECEIVE A CALL BACK THE SAME DAY AS LONG AS YOU CALL BEFORE 4:00 PM   Do the following things EVERYDAY: 1) Weigh yourself in the morning before breakfast. Write it down and keep it in a log. 2) Take your medicines as prescribed 3) Eat low salt foods-Limit salt (sodium) to 2000 mg per day.  4) Stay as active as you can everyday 5) Limit all fluids for the day to less than 2 liters   At the [facility], you and your health needs are our priority. As part of our continuing mission to provide you with exceptional heart care, we have created designated Provider Care Teams. These Care Teams include your primary Cardiologist (physician) and Advanced Practice Providers (APPs- Physician Assistants and Nurse Practitioners) who all work together to provide you with the care you need, when you need it.   You may see any of the following providers on your designated Care Team at your next follow up: [phone] Dr Marland Kitchen . Dr Arvilla Meres . Marca Ancona, NP . Tonye Becket, PA . Robbie Lis, PharmD   Please be sure to bring in all your medications bottles to every appointment.

## 2020-04-01 NOTE — Progress Notes (Signed)
Heart Failure TeleHealth Note  Due to national recommendations of social distancing due to COVID 19, Audio/video telehealth visit is felt to be most appropriate for this patient at this time.  See MyChart message from today for patient consent regarding telehealth for [facility].  Date:  04/01/2020   ID:  Walter George, DOB 18-May-1945, MRN [id number]  Location: Patient in the office, physician at home due to quarantine.  Provider location: [facility] Type of Visit: Established patient   PCP:  Rodrigo Ran, MD  Cardiologist:  Dr. Shirlee Latch   History of Present Illness: Walter George is a 75 y.o. male who presents via audio/video conferencing for a telehealth visit today.     he denies symptoms worrisome for COVID 19.   Patient with hyperlipidemia and abnormal calcium score scan presents for followup of CAD risk.  He had a screening coronary calcium score done in 10/20.  This showed 1654 Agatston units, placing him in the 91st percentile for his age and gender.  He is now on atorvastatin. Cardiolite in 10/20 showed no ischemia or infarction.  Of note, his younger brother died suddenly, autopsy showed he had a myocardial infarction.   He continues to do well symptomatically.  He plays doubles tennis and golfs regularly.  No significant exertional dyspnea, no chest pain.  Excellent exercise tolerance.  No lightheadedness/syncope/palpitations. BP mildly elevated in the office today but generally SBP runs < 130.    Labs (9/20): LDL 93, HDL 71, K 4.8, creatinine 1.3 Labs (11/20): K 4.8, creatinine 1.13 Labs (12/20): LDL 63, HDL 63 Labs (9/21): LDL 61, HDL 71, TGs 66  ECG (personally reviewed): sinus brady 48 bpm, otherwise normal.   PMH: 1. Nephrolithiasis 2. Hyperlipidemia 3. CAD: Coronary calcium score scan in 10/20 with 1654 Agatston units, placing the patient in the 91st percentile for age/gender. - Echo (11/18): EF 55-60%, mild LVH.   -  ETT-Cardiolite (10/20) with 16 METS, no significant ST changes, EF 61%, no ischemia/infarction.   SH: Married, retired Psychologist, occupational living in [place].  Occasional ETOH, never smoker.   FH: Father died with lymphoma, mother had dementia and died at 97.  Younger brother with SCD from MI.   ROS: All systems reviewed and negative except as per HPI.   Current Outpatient Medications  Medication Sig Dispense Refill  . aspirin EC 81 MG tablet Take 1 tablet (81 mg total) by mouth daily. 90 tablet 3  . atorvastatin (LIPITOR) 80 MG tablet Take 1 tablet (80 mg total) by mouth daily. 90 tablet 3  . ezetimibe (ZETIA) 10 MG tablet Take 1 tablet (10 mg total) by mouth daily. 90 tablet 3  . Multiple Vitamin (MULTIVITAMIN WITH MINERALS) TABS tablet Take 1 tablet by mouth daily.    . ramipril (ALTACE) 1.25 MG capsule Take 1 capsule (1.25 mg total) by mouth daily. 90 capsule 3  . saw palmetto 500 MG capsule Take 500 mg by mouth daily.    . tadalafil (CIALIS) 10 MG tablet Take 10 mg by mouth daily.    . Turmeric 500 MG CAPS Take by mouth daily.      No current facility-administered medications for this encounter.   BP 138/82   Pulse (!) 52   Wt 76 kg (167 lb 9.6 oz)   SpO2 100%   BMI 24.75 kg/m  Exam:  (Video/Tele Health Call; Exam is subjective and or/visual.) General:  Speaks in full sentences. No resp difficulty. Lungs: Normal respiratory  effort with conversation.  Abdomen: Non-distended per patient report Extremities: Pt denies edema. Neuro: Alert & oriented x 3.   Assessment/Plan: 1. CAD: Patient had a coronary calcium score placing him the 91st percentile for age and gender.  The absolute calcium score number suggests significant risk for obstructive coronary disease.  ETT-Cardiolite in 10/20 was low risk with no ischemia/infarction on perfusion images and excellent exercise tolerance.  No ischemic symptoms.  - Continue aspirin 81 mg daily.   - Continue low-dose ramipril for primary prevention of  cardiac disease.  - Goal LDL < 50.  Continue atorvastatin 80 mg daily and add Zetia 10 mg daily.  Lipids/LFTs in 2 months.  2. Hyperlipidemia: Goal LDL < 50 ideally.    - As above, continue atorvastatin and add Zetia.   COVID screen The patient does not have any symptoms that suggest any further testing/ screening at this time.  Social distancing reinforced today.  Patient Risk: After full review of this patients clinical status, I feel that they are at moderate risk for cardiac decompensation at this time.  Relevant cardiac medications were reviewed at length with the patient today. The patient does not have concerns regarding their medications at this time.   Recommended follow-up:  1 year  Today, I have spent 15 minutes with the patient with telehealth technology discussing the above issues .    Signed, Marca Ancona, MD  04/01/2020  [facility] [address] [facility] [place] [address] [phone] (office) [phone] (fax)

## 2020-04-24 ENCOUNTER — Other Ambulatory Visit (HOSPITAL_COMMUNITY): Payer: Self-pay

## 2020-04-24 MED ORDER — ATORVASTATIN CALCIUM 80 MG PO TABS: 80.0000 mg | ORAL_TABLET | Freq: Every day | ORAL | 3 refills | Status: DC

## 2020-05-29 ENCOUNTER — Ambulatory Visit (HOSPITAL_COMMUNITY)
Admission: RE | Admit: 2020-05-29 | Discharge: 2020-05-29 | Disposition: A | Discharge status: Home / Self Care | Payer: Medicare Other | Source: Ambulatory Visit | Attending: Internal Medicine | Admitting: Internal Medicine

## 2020-05-29 ENCOUNTER — Other Ambulatory Visit: Payer: Self-pay

## 2020-05-29 DIAGNOSIS — E785 Hyperlipidemia, unspecified: Secondary | ICD-10-CM | POA: Insufficient documentation

## 2020-05-29 LAB — LIPID PANEL
Cholesterol: 113 mg/dL (ref 0–200)
HDL: 59 mg/dL (ref >40)
LDL Cholesterol: 43 mg/dL (ref 0–99)
Total CHOL/HDL Ratio: 1.9 RATIO
Triglycerides: 56 mg/dL (ref <150)
VLDL: 11 mg/dL (ref 0–40)

## 2020-05-29 LAB — HEPATIC FUNCTION PANEL
ALT: 20 U/L (ref 0–44)
AST: 37 U/L (ref 15–41)
Albumin: 3.8 g/dL (ref 3.5–5.0)
Alkaline Phosphatase: 114 U/L (ref 38–126)
Bilirubin, Direct: 0.2 mg/dL (ref 0.0–0.2)
Indirect Bilirubin: 1.1 mg/dL — ABNORMAL HIGH (ref 0.3–0.9)
Total Bilirubin: 1.3 mg/dL — ABNORMAL HIGH (ref 0.3–1.2)
Total Protein: 6.3 g/dL — ABNORMAL LOW (ref 6.5–8.1)

## 2021-01-08 ENCOUNTER — Other Ambulatory Visit (HOSPITAL_COMMUNITY): Payer: Self-pay

## 2021-01-08 MED ORDER — EZETIMIBE 10 MG PO TABS: 10.0000 mg | ORAL_TABLET | Freq: Every day | ORAL | 0 refills | Status: DC

## 2021-01-09 DIAGNOSIS — E785 Hyperlipidemia, unspecified: Secondary | ICD-10-CM | POA: Diagnosis not present

## 2021-01-09 DIAGNOSIS — Z125 Encounter for screening for malignant neoplasm of prostate: Secondary | ICD-10-CM | POA: Diagnosis not present

## 2021-01-19 ENCOUNTER — Other Ambulatory Visit (HOSPITAL_COMMUNITY): Payer: Self-pay | Admitting: Internal Medicine

## 2021-01-22 DIAGNOSIS — Z1212 Encounter for screening for malignant neoplasm of rectum: Secondary | ICD-10-CM | POA: Diagnosis not present

## 2021-01-22 DIAGNOSIS — R82998 Other abnormal findings in urine: Secondary | ICD-10-CM | POA: Diagnosis not present

## 2021-03-06 DIAGNOSIS — L570 Actinic keratosis: Secondary | ICD-10-CM | POA: Diagnosis not present

## 2021-03-06 DIAGNOSIS — Z85828 Personal history of other malignant neoplasm of skin: Secondary | ICD-10-CM | POA: Diagnosis not present

## 2021-03-06 DIAGNOSIS — L812 Freckles: Secondary | ICD-10-CM | POA: Diagnosis not present

## 2021-03-06 DIAGNOSIS — L821 Other seborrheic keratosis: Secondary | ICD-10-CM | POA: Diagnosis not present

## 2021-05-08 ENCOUNTER — Encounter (INDEPENDENT_AMBULATORY_CARE_PROVIDER_SITE_OTHER): Payer: Self-pay

## 2021-05-08 DIAGNOSIS — H43813 Vitreous degeneration, bilateral: Secondary | ICD-10-CM | POA: Diagnosis not present

## 2021-05-08 DIAGNOSIS — Z961 Presence of intraocular lens: Secondary | ICD-10-CM | POA: Diagnosis not present

## 2021-05-08 DIAGNOSIS — H33303 Unspecified retinal break, bilateral: Secondary | ICD-10-CM | POA: Diagnosis not present

## 2021-05-14 ENCOUNTER — Other Ambulatory Visit: Payer: Self-pay

## 2021-05-14 ENCOUNTER — Ambulatory Visit (HOSPITAL_COMMUNITY)
Admission: RE | Admit: 2021-05-14 | Discharge: 2021-05-14 | Disposition: A | Discharge status: Home / Self Care | Payer: Medicare Other | Source: Ambulatory Visit | Attending: Cardiology | Admitting: Cardiology

## 2021-05-14 ENCOUNTER — Encounter (HOSPITAL_COMMUNITY): Payer: Self-pay | Admitting: Cardiology

## 2021-05-14 VITALS — BP 118/70 | HR 45 | Wt 166.8 lb

## 2021-05-14 DIAGNOSIS — E785 Hyperlipidemia, unspecified: Secondary | ICD-10-CM | POA: Diagnosis not present

## 2021-05-14 DIAGNOSIS — Z79899 Other long term (current) drug therapy: Secondary | ICD-10-CM | POA: Diagnosis not present

## 2021-05-14 DIAGNOSIS — I2510 Atherosclerotic heart disease of native coronary artery without angina pectoris: Secondary | ICD-10-CM | POA: Diagnosis not present

## 2021-05-14 DIAGNOSIS — Z7982 Long term (current) use of aspirin: Secondary | ICD-10-CM | POA: Insufficient documentation

## 2021-05-14 NOTE — Patient Instructions (Signed)
Thank you for your visit today.  Start Asprin 81 mg daily. (OTC ) Asprin ok to use.  Your physician recommends that you schedule a follow-up appointment in: 1 year ( February 2024)  ** please call the office in December to schedule your follow up appointment**  If you have any questions or concerns before your next appointment please send [place] a message through [place] or call our office at [phone].    TO LEAVE A MESSAGE FOR THE NURSE SELECT OPTION 2, PLEASE LEAVE A MESSAGE INCLUDING: YOUR NAME DATE OF BIRTH CALL BACK NUMBER REASON FOR CALL**this is important as we prioritize the call backs  YOU WILL RECEIVE A CALL BACK THE SAME DAY AS LONG AS YOU CALL BEFORE 4:00 PM  At the [facility], you and your health needs are our priority. As part of our continuing mission to provide you with exceptional heart care, we have created designated Provider Care Teams. These Care Teams include your primary Cardiologist (physician) and Advanced Practice Providers (APPs- Physician Assistants and Nurse Practitioners) who all work together to provide you with the care you need, when you need it.   You may see any of the following providers on your designated Care Team at your next follow up: Dr Arvilla Meres Dr Carron Curie, NP Robbie Lis, [place] [facility] [place], [place] Karle Plumber, PharmD   Please be sure to bring in all your medications bottles to every appointment.

## 2021-05-14 NOTE — Progress Notes (Signed)
ID:  RACIEL CAFFREY, DOB [birth year], MRN [id number]   Provider location: [facility] Type of Visit: Established patient   PCP:  Rodrigo Ran, MD  Cardiologist:  Dr. Shirlee Latch   History of Present Illness: Walter George is a 76 y.o. with hyperlipidemia and abnormal calcium score scan who presents for followup of CAD.  He had a screening coronary calcium score done in 10/20.  This showed 1654 Agatston units, placing him in the 91st percentile for his age and gender.  He is now on atorvastatin. Cardiolite in 10/20 showed no ischemia or infarction.  Of note, his younger brother died suddenly, autopsy showed he had a myocardial infarction.   He continues to do well symptomatically.  Weight is stable. BP is not elevated.  He continues to exercise regularly, golfs twice a week and plays tennis twice a week.  No exertional dyspnea or chest pain.  No palpitations.     Labs (9/20): LDL 93, HDL 71, K 4.8, creatinine 1.3 Labs (11/20): K 4.8, creatinine 1.13 Labs (12/20): LDL 63, HDL 63 Labs (9/21): LDL 61, HDL 71, TGs 66 Labs (3/22): LDL 43, HDL 59  ECG (personally reviewed): NSR with PACs  PMH: 1. Nephrolithiasis 2. Hyperlipidemia 3. CAD: Coronary calcium score scan in 10/20 with 1654 Agatston units, placing the patient in the 91st percentile for age/gender. - Echo (11/18): EF 55-60%, mild LVH.   - ETT-Cardiolite (10/20) with 16 METS, no significant ST changes, EF 61%, no ischemia/infarction.   SH: Married, retired Psychologist, occupational living in [place].  Occasional ETOH, never smoker.   FH: Father died with lymphoma, mother had dementia and died at 93.  Younger brother with SCD from MI.   ROS: All systems reviewed and negative except as per HPI.   Current Outpatient Medications  Medication Sig Dispense Refill   atorvastatin (LIPITOR) 80 MG tablet Take 1 tablet (80 mg total) by mouth daily. 90 tablet 3   B Complex-C-Folic Acid (B-COMPLEX/VITAMIN C PO) Take 1  tablet by mouth.     ezetimibe (ZETIA) 10 MG tablet Take 1 tablet (10 mg total) by mouth daily. 90 tablet 0   Multiple Vitamin (MULTIVITAMIN WITH MINERALS) TABS tablet Take 1 tablet by mouth daily.     ramipril (ALTACE) 1.25 MG capsule TAKE 1 CAPSULE(1.25 MG) BY MOUTH DAILY 90 capsule 11   saw palmetto 500 MG capsule Take 500 mg by mouth daily.     tadalafil (CIALIS) 10 MG tablet Take 10 mg by mouth daily.     Turmeric 500 MG CAPS Take by mouth daily.      aspirin EC 81 MG tablet Take 1 tablet (81 mg total) by mouth daily. (Patient not taking: Reported on 05/14/2021) 90 tablet 3   No current facility-administered medications for this encounter.   BP 118/70    Pulse (!) 45    Wt 75.7 kg (166 lb 12.8 oz)    SpO2 99%    BMI 24.63 kg/m  General: NAD Neck: No JVD, no thyromegaly or thyroid nodule.  Lungs: Clear to auscultation bilaterally with normal respiratory effort. CV: Nondisplaced PMI.  Heart regular S1/S2, no S3/S4, no murmur.  No peripheral edema.  No carotid bruit.  Normal pedal pulses.  Abdomen: Soft, nontender, no hepatosplenomegaly, no distention.  Skin: Intact without lesions or rashes.  Neurologic: Alert and oriented x 3.  Psych: Normal affect. Extremities: No clubbing or cyanosis.  HEENT: Normal.   Assessment/Plan: 1. CAD: Patient had  a coronary calcium score placing him the 91st percentile for age and gender.  The absolute calcium score number suggests significant risk for obstructive coronary disease.  ETT-Cardiolite in 10/20 was low risk with no ischemia/infarction on perfusion images and excellent exercise tolerance.  No ischemic symptoms.  - Continue aspirin 81 mg daily.   - Continue low-dose ramipril for primary prevention of cardiac disease.  - Goal LDL < 55.  Continue atorvastatin 80 mg daily and Zetia 10 mg daily.   - We had a discuss about ASA use.  ASA 81 not recommended for primary prevention, but given his degree of CAD by calcium scoring, I think it would be  reasonable for him to take ASA 81 given low risk for bleeding events.  2. Hyperlipidemia: Goal LDL < 55 ideally.    - He had lipids done in the last couple of months by PCP, I will call for a copy.   Followup in 1 year.   Signed, Marca Ancona, MD  05/14/2021  [facility] [address] [facility] [place] [address] [phone] (office) [phone] (fax)

## 2021-05-15 ENCOUNTER — Other Ambulatory Visit (HOSPITAL_COMMUNITY): Payer: Self-pay | Admitting: *Deleted

## 2021-05-15 MED ORDER — ATORVASTATIN CALCIUM 80 MG PO TABS: 80.0000 mg | ORAL_TABLET | Freq: Every day | ORAL | 3 refills | Status: DC

## 2021-05-17 ENCOUNTER — Other Ambulatory Visit (HOSPITAL_COMMUNITY): Payer: Self-pay | Admitting: Cardiology

## 2021-05-17 MED ORDER — ATORVASTATIN CALCIUM 80 MG PO TABS: 80.0000 mg | ORAL_TABLET | Freq: Every day | ORAL | 3 refills | Status: DC

## 2021-05-17 MED ORDER — EZETIMIBE 10 MG PO TABS: 10.0000 mg | ORAL_TABLET | Freq: Every day | ORAL | 3 refills | Status: DC

## 2021-05-22 ENCOUNTER — Ambulatory Visit (INDEPENDENT_AMBULATORY_CARE_PROVIDER_SITE_OTHER): Payer: Medicare Other | Admitting: Ophthalmology

## 2021-05-22 ENCOUNTER — Encounter (INDEPENDENT_AMBULATORY_CARE_PROVIDER_SITE_OTHER): Payer: Self-pay | Admitting: Ophthalmology

## 2021-05-22 ENCOUNTER — Other Ambulatory Visit: Payer: Self-pay

## 2021-05-22 DIAGNOSIS — H33311 Horseshoe tear of retina without detachment, right eye: Secondary | ICD-10-CM | POA: Diagnosis not present

## 2021-05-22 DIAGNOSIS — H33312 Horseshoe tear of retina without detachment, left eye: Secondary | ICD-10-CM | POA: Diagnosis not present

## 2021-05-22 NOTE — Assessment & Plan Note (Signed)
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

## 2021-05-22 NOTE — Progress Notes (Signed)
05/22/2021     CHIEF COMPLAINT Patient presents for  Chief Complaint  Patient presents with   Retina Evaluation      HISTORY OF PRESENT ILLNESS: Walter George is a 76 y.o. male who presents to the clinic today for:   HPI     Retina Evaluation           Laterality: both eyes   Associated Symptoms: Negative for Flashes and Floaters         Comments   NP- chronic appearing retinal tears OU, referred by [facility]. Pt states "Dr. Burgess Estelle said he said a couple tears. I didn't pay much attention to it but I realize my vision would be foggy and then it would go away. My right eye seems to be fine but I notice the blurriness or a "little ghost" in the left eye." Denies new FOL or floaters.      Last edited by Nelva Nay on 05/22/2021  2:13 PM.      Referring physician: Janet Berlin, MD [address] [place],  [place] [address]  HISTORICAL INFORMATION:   Selected notes from the MEDICAL RECORD NUMBER       CURRENT MEDICATIONS: No current outpatient medications on file. (Ophthalmic Drugs)   No current facility-administered medications for this visit. (Ophthalmic Drugs)   Current Outpatient Medications (Other)  Medication Sig   aspirin EC 81 MG tablet Take 1 tablet (81 mg total) by mouth daily. (Patient not taking: Reported on 05/14/2021)   atorvastatin (LIPITOR) 80 MG tablet Take 1 tablet (80 mg total) by mouth daily.   B Complex-C-Folic Acid (B-COMPLEX/VITAMIN C PO) Take 1 tablet by mouth.   ezetimibe (ZETIA) 10 MG tablet Take 1 tablet (10 mg total) by mouth daily.   Multiple Vitamin (MULTIVITAMIN WITH MINERALS) TABS tablet Take 1 tablet by mouth daily.   ramipril (ALTACE) 1.25 MG capsule TAKE 1 CAPSULE(1.25 MG) BY MOUTH DAILY   saw palmetto 500 MG capsule Take 500 mg by mouth daily.   tadalafil (CIALIS) 10 MG tablet Take 10 mg by mouth daily.   Turmeric 500 MG CAPS Take by mouth daily.    No current facility-administered medications for this visit.  (Other)      REVIEW OF SYSTEMS: ROS   Negative for: Constitutional, Gastrointestinal, Neurological, Skin, Genitourinary, Musculoskeletal, HENT, Endocrine, Cardiovascular, Eyes, Respiratory, Psychiatric, Allergic/Imm, Heme/Lymph Last edited by Edmon Crape, MD on 05/22/2021  2:40 PM.       ALLERGIES No Known Allergies  PAST MEDICAL HISTORY Past Medical History:  Diagnosis Date   CHF (congestive heart failure) (HCC)    Kidney stones    3 stones, last 20 years ago.   Plantar fasciitis    Past Surgical History:  Procedure Laterality Date   COLONOSCOPY     kidney stone removal     x2   TONSILLECTOMY  1953    FAMILY HISTORY Family History  Problem Relation Age of Onset   Cancer Father        lymphoma   Dementia Mother    Dementia Paternal Aunt    Colon cancer Neg Hx    Rectal cancer Neg Hx    Stomach cancer Neg Hx    Esophageal cancer Neg Hx     SOCIAL HISTORY Social History   Tobacco Use   Smoking status: Never   Smokeless tobacco: Never  Substance Use Topics   Alcohol use: Yes    Alcohol/week: 9.0 standard drinks    Types: 9  Standard drinks or equivalent per week    Comment: Occassionaly   Drug use: No         OPHTHALMIC EXAM:  Base Eye Exam     Visual Acuity (ETDRS)       Right Left   Dist [place] 20/20 -1 20/30 -1+2         Tonometry (Tonopen, 2:15 PM)       Right Left   Pressure 13 13         Pupils       Pupils Dark Light APD   Right PERRL 4 3 None   Left PERRL 3 2 None         Visual Fields (Counting fingers)       Left Right    Full Full         Extraocular Movement       Right Left    Full Full         Neuro/Psych     Oriented x3: Yes   Mood/Affect: Normal         Dilation     Both eyes: 1.0% Mydriacyl, 2.5% Phenylephrine @ 2:15 PM           Slit Lamp and Fundus Exam     External Exam       Right Left   External Normal Normal         Slit Lamp Exam       Right Left   Lids/Lashes  Normal Normal   Conjunctiva/Sclera White and quiet White and quiet   Cornea Clear Clear   Anterior Chamber Deep and quiet Deep and quiet   Iris Round and reactive Round and reactive   Lens Centered posterior chamber intraocular lens Centered posterior chamber intraocular lens   Anterior Vitreous Normal Normal         Fundus Exam       Right Left   Posterior Vitreous Posterior vitreous detachment Posterior vitreous detachment   Disc Normal Normal, Clear media OU normal retinal vasculature, careful evaluation of the proximal inferotemporal retinal artery at the nerve in the cuff of the left eye suggest on the color fundus photos there may be intraluminal plaque, however when correlated with clinical findings with 90 diopter and 78 diopter examination it is clear that this is actually vitreous condensation or Weiss ring remnant on the surface of the vessel and not an intraluminal plaqueSpecifically again no intraluminal plaque at the nerve   C/D Ratio 0.25 0.25   Macula Normal Normal   Vessels Normal Normal   Periphery Pigmented chorioretinal scar, 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 Scan locations included subfoveal. Central Foveal Thickness: 294. Progression has no prior data. Findings include normal foveal contour.   Left Eye Quality was good. Scan locations included subfoveal. Central Foveal Thickness: 292. Progression has no prior data. Findings include normal foveal contour.   Notes Normal no active maculopathy     Color Fundus Photography Optos - OU - Both Eyes       Right Eye Progression has no prior data. Disc findings include normal observations. Macula : normal observations. Vessels : normal  observations. Periphery :  normal observations.   Left Eye Progression has no prior data. Disc findings include normal observations. Macula : normal observations. Vessels : normal observations. Periphery : normal observations.   Notes Clear media OU normal retinal vasculature, careful evaluation of the proximal inferotemporal retinal artery at the nerve in the cuff of the left eye suggest on the color fundus photos there may be intraluminal plaque, however when correlated with clinical findings with 90 diopter and 78 diopter examination it is clear that this is actually vitreous condensation or Weiss ring remnant on the surface of the vessel and not an intraluminal plaque     Repair Retinal Breaks, Laser - OS - Left Eye       Tear locations include superior.   Time Out Confirmed correct patient, procedure, site, and patient consented.   Anesthesia Topical anesthesia was used. Anesthetic medications included Proparacaine 0.5%.   Laser Information The type of laser was diode. Color was yellow. The duration in seconds was 0.03. The spot size was 390 microns. Laser power was 280. Total spots was 81.   Post-op The patient tolerated the procedure well. There were no complications. The patient received written and verbal post procedure care education.   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 superior temporally OS, the tractional forces of the vitreous on this flap suggested extra laser retinopexy would be appropriate in the pseudophakic eye although there is still low risk in the self pigmentary change, the superior location suggest extra laser retinopexy with with certainly prevent this particular area from triggering retinal detachment  Retinal tear, right Pigmented  chorioretinal scarring around a small horseshoe tear superior temporally OD, the tractional forces of the vitreous on this flap suggested extra laser retinopexy would be appropriate in the pseudophakic eye although there is still low risk in the self pigmentary change, the superior location suggest extra laser retinopexy with with certainly prevent this particular area from triggering retinal detachment     ICD-10-CM   1. Retinal tear, left  H33.312 OCT, Retina - OU - Both Eyes    Color Fundus Photography Optos - OU - Both Eyes    Repair Retinal Breaks, Laser - OS - Left Eye    2. Retinal tear, right  H33.311 OCT, Retina - OU - Both Eyes    Color Fundus Photography Optos - OU - Both Eyes      1.  Risk and benefits of observation versus prophylactic treatment of each of these conditions reviewed at length.  While the pigmentary changes a sign that there has been spontaneous healing, ongoing vitreal retinal traction could indeed trigger a tear extending beyond the continuous closure with pigment.  For this reason laser retinopexy is recommended because of the superior location in each eye.  2.  Laser retinopexy offered and delivered today left eye.  No complication  3.  Follow-up soon per patient scheduled for laser retinopexy right eye  Ophthalmic Meds Ordered this visit:  No orders of the defined types were placed in this encounter.      Return in about 1 day (around 05/23/2021) for OD, dilate, RETINOPEXY.  There are no Patient Instructions on file for this visit.   Explained the diagnoses, plan, and follow up with the patient and they expressed understanding.  Patient expressed understanding of the importance of proper follow up care.   Alford HighlandGary A. Masao Junker M.D. Diseases & Surgery of the Retina and Vitreous [facility] 05/22/21     Abbreviations: M myopia (nearsighted); A astigmatism; H hyperopia (farsighted); P presbyopia; Mrx spectacle prescription;  CTL contact  lenses; OD right eye; OS left eye; OU both eyes  XT exotropia; ET esotropia; PEK punctate epithelial keratitis; PEE punctate epithelial erosions; DES dry eye syndrome; MGD meibomian gland dysfunction; ATs artificial tears; PFAT's preservative free artificial tears; NSC nuclear sclerotic cataract; PSC posterior subcapsular cataract; ERM epi-retinal membrane; PVD posterior vitreous detachment; RD retinal detachment; DM diabetes mellitus; DR diabetic retinopathy; NPDR non-proliferative diabetic retinopathy; PDR proliferative diabetic retinopathy; CSME clinically significant macular edema; DME diabetic macular edema; dbh dot blot hemorrhages; CWS cotton wool spot; POAG primary open angle glaucoma; C/D cup-to-disc ratio; HVF humphrey visual field; GVF goldmann visual field; OCT optical coherence tomography; IOP intraocular pressure; BRVO Branch retinal vein occlusion; CRVO central retinal vein occlusion; CRAO central retinal artery occlusion; BRAO branch retinal artery occlusion; RT retinal tear; SB scleral buckle; PPV pars plana vitrectomy; VH Vitreous hemorrhage; PRP panretinal laser photocoagulation; IVK intravitreal kenalog; VMT vitreomacular traction; MH Macular hole;  NVD neovascularization of the disc; NVE neovascularization elsewhere; AREDS age related eye disease study; ARMD age related macular degeneration; POAG primary open angle glaucoma; EBMD epithelial/anterior basement membrane dystrophy; ACIOL anterior chamber intraocular lens; IOL intraocular lens; PCIOL posterior chamber intraocular lens; Phaco/IOL phacoemulsification with intraocular lens placement; PRK photorefractive keratectomy; LASIK laser assisted in situ keratomileusis; HTN hypertension; DM diabetes mellitus; COPD chronic obstructive pulmonary disease

## 2021-05-22 NOTE — Assessment & Plan Note (Signed)
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

## 2021-05-23 ENCOUNTER — Encounter (INDEPENDENT_AMBULATORY_CARE_PROVIDER_SITE_OTHER): Payer: Self-pay | Admitting: Ophthalmology

## 2021-05-23 ENCOUNTER — Ambulatory Visit (INDEPENDENT_AMBULATORY_CARE_PROVIDER_SITE_OTHER): Payer: Medicare Other | Admitting: Ophthalmology

## 2021-05-23 DIAGNOSIS — H33311 Horseshoe tear of retina without detachment, right eye: Secondary | ICD-10-CM

## 2021-05-23 NOTE — Assessment & Plan Note (Signed)
OD, pigmented tear at 11:30 position, treated today with laser retinopexy ?

## 2021-05-23 NOTE — Progress Notes (Signed)
05/23/2021     CHIEF COMPLAINT Patient presents for  Chief Complaint  Patient presents with   Retina Follow Up      HISTORY OF PRESENT ILLNESS: Walter George is a 76 y.o. male who presents to the clinic today for:   HPI     Retina Follow Up           Diagnosis: Other (Retinal Tear)   Laterality: right eye   Onset: 1 day ago         Comments   1 day OD dilate OD, retinopexy.  For retinal hole 1 day postop previous laser retinopexy OS Patient states vision is stable and unchanged since last visit. Denies any new floaters or FOL.       Last edited by Edmon Crape, MD on 05/23/2021  9:46 AM.      Referring physician: Rodrigo Ran, MD [address] [place],  [place] [address]  HISTORICAL INFORMATION:   Selected notes from the MEDICAL RECORD NUMBER       CURRENT MEDICATIONS: No current outpatient medications on file. (Ophthalmic Drugs)   No current facility-administered medications for this visit. (Ophthalmic Drugs)   Current Outpatient Medications (Other)  Medication Sig   aspirin EC 81 MG tablet Take 1 tablet (81 mg total) by mouth daily. (Patient not taking: Reported on 05/14/2021)   atorvastatin (LIPITOR) 80 MG tablet Take 1 tablet (80 mg total) by mouth daily.   B Complex-C-Folic Acid (B-COMPLEX/VITAMIN C PO) Take 1 tablet by mouth.   ezetimibe (ZETIA) 10 MG tablet Take 1 tablet (10 mg total) by mouth daily.   Multiple Vitamin (MULTIVITAMIN WITH MINERALS) TABS tablet Take 1 tablet by mouth daily.   ramipril (ALTACE) 1.25 MG capsule TAKE 1 CAPSULE(1.25 MG) BY MOUTH DAILY   saw palmetto 500 MG capsule Take 500 mg by mouth daily.   tadalafil (CIALIS) 10 MG tablet Take 10 mg by mouth daily.   Turmeric 500 MG CAPS Take by mouth daily.    No current facility-administered medications for this visit. (Other)      REVIEW OF SYSTEMS: ROS   Negative for: Constitutional, Gastrointestinal, Neurological, Skin, Genitourinary, Musculoskeletal, HENT,  Endocrine, Cardiovascular, Eyes, Respiratory, Psychiatric, Allergic/Imm, Heme/Lymph Last edited by Edmon Crape, MD on 05/23/2021  9:46 AM.       ALLERGIES No Known Allergies  PAST MEDICAL HISTORY Past Medical History:  Diagnosis Date   CHF (congestive heart failure) (HCC)    Kidney stones    3 stones, last 20 years ago.   Plantar fasciitis    Past Surgical History:  Procedure Laterality Date   COLONOSCOPY     kidney stone removal     x2   TONSILLECTOMY  1953    FAMILY HISTORY Family History  Problem Relation Age of Onset   Cancer Father        lymphoma   Dementia Mother    Dementia Paternal Aunt    Colon cancer Neg Hx    Rectal cancer Neg Hx    Stomach cancer Neg Hx    Esophageal cancer Neg Hx     SOCIAL HISTORY Social History   Tobacco Use   Smoking status: Never   Smokeless tobacco: Never  Substance Use Topics   Alcohol use: Yes    Alcohol/week: 9.0 standard drinks    Types: 9 Standard drinks or equivalent per week    Comment: Occassionaly   Drug use: No         OPHTHALMIC EXAM:  Base Eye Exam     Visual Acuity (ETDRS)       Right Left   Dist [place] 20/20 20/25 +2         Tonometry (Tonopen, 9:19 AM)       Right Left   Pressure 22 14         Pupils       Pupils APD   Right PERRL None   Left PERRL None         Extraocular Movement       Right Left    Full Full         Neuro/Psych     Oriented x3: Yes   Mood/Affect: Normal         Dilation     Right eye: 1.0% Mydriacyl, 2.5% Phenylephrine @ 9:19 AM           Slit Lamp and Fundus Exam     External Exam       Right Left   External Normal Normal         Slit Lamp Exam       Right Left   Lids/Lashes Normal Normal   Conjunctiva/Sclera White and quiet White and quiet   Cornea Clear Clear   Anterior Chamber Deep and quiet Deep and quiet   Iris Round and reactive Round and reactive   Lens Centered posterior chamber intraocular lens Centered posterior  chamber intraocular lens   Anterior Vitreous Normal Normal         Fundus Exam       Right Left   Posterior Vitreous Posterior vitreous detachment    Disc Normal    C/D Ratio 0.25    Macula Normal    Vessels Normal    Periphery Pigmented chorioretinal scar, 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   Time Out Confirmed correct patient, procedure, site, and patient consented.   Anesthesia Topical anesthesia was used. Anesthetic medications included Proparacaine 0.5%.   Laser Information The type of laser was diode. Color was yellow. The duration in seconds was 0.03. The spot size was 390 microns. Laser power was 260. Total spots was 135.   Post-op The patient tolerated the procedure well. There were no complications. The patient received written and verbal post procedure care education.   Notes Laser spacing delivered.,  0.25 laser width,             ASSESSMENT/PLAN:  Retinal tear, right OD, pigmented tear at 11:30 position, treated today with laser retinopexy     ICD-10-CM   1. Retinal tear, right  H33.311 Repair Retinal Breaks, Laser - OD - Right Eye      1.  Laser retinopexy applied today to 1130 tear OD.  Photos reviewed with the patient and displayed on the treatment screen of the NAVILAS laser  2.  Postop day #1 laser retinopexy left eye.  Patient will be seen in follow-up dilate OU and 4 months or when present  3.  Patient understands the critical importance of notifying another eye professional or retina professional should symptoms of flashing lights that are new sparks and flashes or new onset shower or floaters or a curtain of darkness developed  Ophthalmic Meds Ordered this visit:  No  orders of the defined types were placed in this encounter.       Return in about 4 months (around 09/22/2021) for DILATE OU, COLOR FP.  There are no Patient Instructions on file for this visit.   Explained the diagnoses, plan, and follow up with the patient and they expressed understanding.  Patient expressed understanding of the importance of proper follow up care.   Alford HighlandGary A. Saivon Prowse M.D. Diseases & Surgery of the Retina and Vitreous [facility] 05/23/21     Abbreviations: M myopia (nearsighted); A astigmatism; H hyperopia (farsighted); P presbyopia; Mrx spectacle prescription;  CTL contact lenses; OD right eye; OS left eye; OU both eyes  XT exotropia; ET esotropia; PEK punctate epithelial keratitis; PEE punctate epithelial erosions; DES dry eye syndrome; MGD meibomian gland dysfunction; ATs artificial tears; PFAT's preservative free artificial tears; NSC nuclear sclerotic cataract; PSC posterior subcapsular cataract; ERM epi-retinal membrane; PVD posterior vitreous detachment; RD retinal detachment; DM diabetes mellitus; DR diabetic retinopathy; NPDR non-proliferative diabetic retinopathy; PDR proliferative diabetic retinopathy; CSME clinically significant macular edema; DME diabetic macular edema; dbh dot blot hemorrhages; CWS cotton wool spot; POAG primary open angle glaucoma; C/D cup-to-disc ratio; HVF humphrey visual field; GVF goldmann visual field; OCT optical coherence tomography; IOP intraocular pressure; BRVO Branch retinal vein occlusion; CRVO central retinal vein occlusion; CRAO central retinal artery occlusion; BRAO branch retinal artery occlusion; RT retinal tear; SB scleral buckle; PPV pars plana vitrectomy; VH Vitreous hemorrhage; PRP panretinal laser photocoagulation; IVK intravitreal kenalog; VMT vitreomacular traction; MH Macular hole;  NVD neovascularization of the disc; NVE neovascularization elsewhere; AREDS age related eye disease study; ARMD age related macular degeneration; POAG primary open angle glaucoma; EBMD  epithelial/anterior basement membrane dystrophy; ACIOL anterior chamber intraocular lens; IOL intraocular lens; PCIOL posterior chamber intraocular lens; Phaco/IOL phacoemulsification with intraocular lens placement; PRK photorefractive keratectomy; LASIK laser assisted in situ keratomileusis; HTN hypertension; DM diabetes mellitus; COPD chronic obstructive pulmonary disease

## 2021-09-25 ENCOUNTER — Encounter (INDEPENDENT_AMBULATORY_CARE_PROVIDER_SITE_OTHER): Payer: Medicare Other | Admitting: Ophthalmology

## 2021-12-17 ENCOUNTER — Encounter (INDEPENDENT_AMBULATORY_CARE_PROVIDER_SITE_OTHER): Payer: Self-pay | Admitting: Ophthalmology

## 2021-12-17 ENCOUNTER — Ambulatory Visit (INDEPENDENT_AMBULATORY_CARE_PROVIDER_SITE_OTHER): Payer: Medicare Other | Admitting: Ophthalmology

## 2021-12-17 DIAGNOSIS — H33311 Horseshoe tear of retina without detachment, right eye: Secondary | ICD-10-CM

## 2021-12-17 DIAGNOSIS — H33312 Horseshoe tear of retina without detachment, left eye: Secondary | ICD-10-CM | POA: Diagnosis not present

## 2021-12-17 NOTE — Assessment & Plan Note (Signed)
Good retinopexy, no new breaks

## 2021-12-17 NOTE — Progress Notes (Signed)
12/17/2021     CHIEF COMPLAINT Patient presents for  Chief Complaint  Patient presents with   Retina Follow Up      HISTORY OF PRESENT ILLNESS: Walter George is a 76 y.o. male who presents to the clinic today for:   HPI     Retina Follow Up           Diagnosis: Other   Laterality: right eye   Severity: moderate   Course: stable         Comments   6 MOS FOR DILATE COLOR FP. Pt stated no changes in vision. Pt stated, "I noticed maybe I have a blocked tear duct. It happened before the surgery I think and I have some concerns about that. I would wake up with more gunk in my eyes as usual."       Last edited by Silvestre Moment on 12/17/2021  8:07 AM.      Referring physician: Marygrace Drought, MD [place],  [place] [address]  HISTORICAL INFORMATION:   Selected notes from the MEDICAL RECORD NUMBER       CURRENT MEDICATIONS: No current outpatient medications on file. (Ophthalmic Drugs)   No current facility-administered medications for this visit. (Ophthalmic Drugs)   Current Outpatient Medications (Other)  Medication Sig   aspirin EC 81 MG tablet Take 1 tablet (81 mg total) by mouth daily. (Patient not taking: Reported on 05/14/2021)   atorvastatin (LIPITOR) 80 MG tablet Take 1 tablet (80 mg total) by mouth daily.   B Complex-C-Folic Acid (B-COMPLEX/VITAMIN C PO) Take 1 tablet by mouth.   ezetimibe (ZETIA) 10 MG tablet Take 1 tablet (10 mg total) by mouth daily.   Multiple Vitamin (MULTIVITAMIN WITH MINERALS) TABS tablet Take 1 tablet by mouth daily.   ramipril (ALTACE) 1.25 MG capsule TAKE 1 CAPSULE(1.25 MG) BY MOUTH DAILY   saw palmetto 500 MG capsule Take 500 mg by mouth daily.   tadalafil (CIALIS) 10 MG tablet Take 10 mg by mouth daily.   Turmeric 500 MG CAPS Take by mouth daily.    No current facility-administered medications for this visit. (Other)      REVIEW OF SYSTEMS: ROS   Negative for: Constitutional, Gastrointestinal,  Neurological, Skin, Genitourinary, Musculoskeletal, HENT, Endocrine, Cardiovascular, Eyes, Respiratory, Psychiatric, Allergic/Imm, Heme/Lymph Last edited by Silvestre Moment on 12/17/2021  8:02 AM.       ALLERGIES No Known Allergies  PAST MEDICAL HISTORY Past Medical History:  Diagnosis Date   CHF (congestive heart failure) ([facility])    Kidney stones    3 stones, last 20 years ago.   Plantar fasciitis    Past Surgical History:  Procedure Laterality Date   COLONOSCOPY     kidney stone removal     x2   TONSILLECTOMY  1953    FAMILY HISTORY Family History  Problem Relation Age of Onset   Cancer Father        lymphoma   Dementia Mother    Dementia Paternal Aunt    Colon cancer Neg Hx    Rectal cancer Neg Hx    Stomach cancer Neg Hx    Esophageal cancer Neg Hx     SOCIAL HISTORY Social History   Tobacco Use   Smoking status: Never   Smokeless tobacco: Never  Substance Use Topics   Alcohol use: Yes    Alcohol/week: 9.0 standard drinks of alcohol    Types: 9 Standard drinks or equivalent per week    Comment: Occassionaly  Drug use: No         OPHTHALMIC EXAM:  Base Eye Exam     Visual Acuity (ETDRS)       Right Left   Dist [place] 20/20 20/15         Tonometry (Tonopen, 8:07 AM)       Right Left   Pressure 23 17         Pupils       Pupils APD   Right PERRL None   Left PERRL None         Visual Fields       Left Right    Full Full         Extraocular Movement       Right Left    Full, Ortho Full, Ortho         Neuro/Psych     Oriented x3: Yes   Mood/Affect: Normal         Dilation     Both eyes: 1.0% Mydriacyl, 2.5% Phenylephrine @ 8:07 AM           Slit Lamp and Fundus Exam     External Exam       Right Left   External Normal Normal         Slit Lamp Exam       Right Left   Lids/Lashes Normal Normal   Conjunctiva/Sclera White and quiet White and quiet   Cornea Clear Clear   Anterior Chamber Deep and quiet  Deep and quiet   Iris Round and reactive Round and reactive   Lens Centered posterior chamber intraocular lens Centered posterior chamber intraocular lens   Anterior Vitreous Normal Normal         Fundus Exam       Right Left   Posterior Vitreous Posterior vitreous detachment Posterior vitreous detachment   Disc Normal Normal, Clear media OU normal retinal vasculature, careful evaluation of the proximal inferotemporal retinal artery at the nerve in the cuff of the left eye suggest on the color fundus photos there may be intraluminal plaque, however when correlated with clinical findings with 90 diopter and 78 diopter examination it is clear that this is actually vitreous condensation or Weiss ring remnant on the surface of the vessel and not an intraluminal plaqueSpecifically again no intraluminal plaque at the nerve   C/D Ratio 0.25 0.25   Macula Normal Normal   Vessels Normal Normal   Periphery Pigmented chorioretinal scar, 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 Disc findings include normal observations. Macula : normal observations. Vessels : normal observations. Periphery : normal observations.   Left Eye Progression has no prior data. Disc findings include normal observations. Macula : normal observations. Vessels : normal observations. Periphery : normal observations.   Notes Clear media OU normal retinal vasculature, careful evaluation of the proximal inferotemporal retinal artery at the nerve in the cuff of the left eye suggest on the color fundus photos there may be intraluminal plaque, however when correlated with clinical findings with 90 diopter and 78 diopter examination  it is clear that this is actually vitreous condensation or Weiss ring remnant on  the surface of the vessel and not an intraluminal plaque              ASSESSMENT/PLAN:  Retinal tear, right Good retinopexy, no new breaks  Retinal tear, left Good retinopexy OS superotemporal.  No new breaks.  No new symptoms     ICD-10-CM   1. Retinal tear, right  H33.311 Color Fundus Photography Optos - OU - Both Eyes    2. Retinal tear, left  H33.312       1.  OU with history of superotemporal breaks, mirror-image locations in each eye 11:00 OD and 1:00 OS.  No new retinal breaks over time.  Laser retinopexy completed March 2023.    2.  Patient to follow-up with Dr. Janet Berlin as scheduled and here as needed  3.  Ophthalmic Meds Ordered this visit:  No orders of the defined types were placed in this encounter.      Return in about 1 year (around 12/18/2022) for DILATE OU, COLOR FP, OCT.  There are no Patient Instructions on file for this visit.   Explained the diagnoses, plan, and follow up with the patient and they expressed understanding.  Patient expressed understanding of the importance of proper follow up care.   Alford Highland Audrea Bolte M.D. Diseases & Surgery of the Retina and Vitreous [facility] 12/17/21     Abbreviations: M myopia (nearsighted); A astigmatism; H hyperopia (farsighted); P presbyopia; Mrx spectacle prescription;  CTL contact lenses; OD right eye; OS left eye; OU both eyes  XT exotropia; ET esotropia; PEK punctate epithelial keratitis; PEE punctate epithelial erosions; DES dry eye syndrome; MGD meibomian gland dysfunction; ATs artificial tears; PFAT's preservative free artificial tears; NSC nuclear sclerotic cataract; PSC posterior subcapsular cataract; ERM epi-retinal membrane; PVD posterior vitreous detachment; RD retinal detachment; DM diabetes mellitus; DR diabetic retinopathy; NPDR non-proliferative diabetic retinopathy; PDR proliferative  diabetic retinopathy; CSME clinically significant macular edema; DME diabetic macular edema; dbh dot blot hemorrhages; CWS cotton wool spot; POAG primary open angle glaucoma; C/D cup-to-disc ratio; HVF humphrey visual field; GVF goldmann visual field; OCT optical coherence tomography; IOP intraocular pressure; BRVO Branch retinal vein occlusion; CRVO central retinal vein occlusion; CRAO central retinal artery occlusion; BRAO branch retinal artery occlusion; RT retinal tear; SB scleral buckle; PPV pars plana vitrectomy; VH Vitreous hemorrhage; PRP panretinal laser photocoagulation; IVK intravitreal kenalog; VMT vitreomacular traction; MH Macular hole;  NVD neovascularization of the disc; NVE neovascularization elsewhere; AREDS age related eye disease study; ARMD age related macular degeneration; POAG primary open angle glaucoma; EBMD epithelial/anterior basement membrane dystrophy; ACIOL anterior chamber intraocular lens; IOL intraocular lens; PCIOL posterior chamber intraocular lens; Phaco/IOL phacoemulsification with intraocular lens placement; PRK photorefractive keratectomy; LASIK laser assisted in situ keratomileusis; HTN hypertension; DM diabetes mellitus; COPD chronic obstructive pulmonary disease

## 2021-12-17 NOTE — Assessment & Plan Note (Signed)
Good retinopexy OS superotemporal.  No new breaks.  No new symptoms

## 2021-12-24 ENCOUNTER — Encounter (INDEPENDENT_AMBULATORY_CARE_PROVIDER_SITE_OTHER): Payer: Medicare Other | Admitting: Ophthalmology

## 2022-02-01 DIAGNOSIS — E785 Hyperlipidemia, unspecified: Secondary | ICD-10-CM | POA: Diagnosis not present

## 2022-02-01 DIAGNOSIS — R7989 Other specified abnormal findings of blood chemistry: Secondary | ICD-10-CM | POA: Diagnosis not present

## 2022-02-01 DIAGNOSIS — Z125 Encounter for screening for malignant neoplasm of prostate: Secondary | ICD-10-CM | POA: Diagnosis not present

## 2022-02-11 DIAGNOSIS — I517 Cardiomegaly: Secondary | ICD-10-CM | POA: Diagnosis not present

## 2022-02-11 DIAGNOSIS — I2510 Atherosclerotic heart disease of native coronary artery without angina pectoris: Secondary | ICD-10-CM | POA: Diagnosis not present

## 2022-02-11 DIAGNOSIS — Z0000 Encounter for general adult medical examination without abnormal findings: Secondary | ICD-10-CM | POA: Diagnosis not present

## 2022-02-11 DIAGNOSIS — Z1212 Encounter for screening for malignant neoplasm of rectum: Secondary | ICD-10-CM | POA: Diagnosis not present

## 2022-02-11 DIAGNOSIS — E785 Hyperlipidemia, unspecified: Secondary | ICD-10-CM | POA: Diagnosis not present

## 2022-02-11 DIAGNOSIS — R82998 Other abnormal findings in urine: Secondary | ICD-10-CM | POA: Diagnosis not present

## 2022-02-14 ENCOUNTER — Ambulatory Visit (HOSPITAL_COMMUNITY)
Admission: RE | Admit: 2022-02-14 | Discharge: 2022-02-14 | Disposition: A | Discharge status: Home / Self Care | Payer: Medicare Other | Source: Ambulatory Visit | Attending: Nurse Practitioner | Admitting: Nurse Practitioner

## 2022-02-14 ENCOUNTER — Encounter (HOSPITAL_COMMUNITY): Payer: Self-pay | Admitting: Nurse Practitioner

## 2022-02-14 VITALS — BP 136/76 | HR 64 | Ht 69.0 in | Wt 165.8 lb

## 2022-02-14 DIAGNOSIS — Z7901 Long term (current) use of anticoagulants: Secondary | ICD-10-CM | POA: Diagnosis not present

## 2022-02-14 DIAGNOSIS — D6869 Other thrombophilia: Secondary | ICD-10-CM

## 2022-02-14 DIAGNOSIS — R9431 Abnormal electrocardiogram [ECG] [EKG]: Secondary | ICD-10-CM | POA: Diagnosis not present

## 2022-02-14 DIAGNOSIS — I4891 Unspecified atrial fibrillation: Secondary | ICD-10-CM | POA: Diagnosis not present

## 2022-02-14 DIAGNOSIS — I2510 Atherosclerotic heart disease of native coronary artery without angina pectoris: Secondary | ICD-10-CM | POA: Diagnosis not present

## 2022-02-14 NOTE — Progress Notes (Addendum)
Primary Care Physician: Rodrigo Ran, MD Referring Physician: Dr. Waynard Edwards Cardiologist: Dr. Zebedee Iba Walter George is a 76 y.o. male with a h/o CAD with elevated calcium score that is in the afib clinic after PCP found asymptomatic afib, rate controlled at recent physical and was referred to afib clinic. He was started on eliquis 5 mg bid for a CHA2DS2VASc  score of 3( will turn 76 in December (2)and CAD) . Rate control was not indicated.  In the afib clinic today, she states that he feels great. He plays tennis  and golfs on a regular basis and has not noted any change in stamina or breathing, he is not aware of onset of afib. His first full day of eliquis was Tuesday, 11/28.   Today, he denies symptoms of palpitations, chest pain, shortness of breath, orthopnea, PND, lower extremity edema, dizziness, presyncope, syncope, or neurologic sequela. The patient is tolerating medications without difficulties and is otherwise without complaint today.   Past Medical History:  Diagnosis Date   CHF (congestive heart failure) (HCC)    Kidney stones    3 stones, last 20 years ago.   Plantar fasciitis    Past Surgical History:  Procedure Laterality Date   COLONOSCOPY     kidney stone removal     x2   TONSILLECTOMY  1953    Current Outpatient Medications  Medication Sig Dispense Refill   apixaban (ELIQUIS) 5 MG TABS tablet Take 5 mg by mouth 2 (two) times daily.     atorvastatin (LIPITOR) 80 MG tablet Take 1 tablet (80 mg total) by mouth daily. 90 tablet 3   ezetimibe (ZETIA) 10 MG tablet Take 1 tablet (10 mg total) by mouth daily. 90 tablet 3   Multiple Vitamin (MULTIVITAMIN WITH MINERALS) TABS tablet Take 1 tablet by mouth daily.     ramipril (ALTACE) 1.25 MG capsule TAKE 1 CAPSULE(1.25 MG) BY MOUTH DAILY 90 capsule 11   Turmeric 500 MG CAPS Take 500 mcg by mouth daily.     aspirin EC 81 MG tablet Take 1 tablet (81 mg total) by mouth daily. (Patient not taking: Reported on 05/14/2021)  90 tablet 3   saw palmetto 500 MG capsule Take 500 mg by mouth daily. (Patient not taking: Reported on 02/14/2022)     tadalafil (CIALIS) 10 MG tablet Take 10 mg by mouth daily. (Patient not taking: Reported on 02/14/2022)     No current facility-administered medications for this encounter.    No Known Allergies  Social History   Socioeconomic History   Marital status: Married    Spouse name: Not on file   Number of children: 2   Years of education: 16   Highest education level: Not on file  Occupational History   Occupation: banker    Comment: retired  Tobacco Use   Smoking status: Never   Smokeless tobacco: Never  Substance and Sexual Activity   Alcohol use: Yes    Alcohol/week: 9.0 standard drinks of alcohol    Types: 9 Standard drinks or equivalent per week    Comment: Occassionaly   Drug use: No   Sexual activity: Yes    Partners: Female  Other Topics Concern   Not on file  Social History Narrative   HSG, [employer]. Married '73. 1 dtr - '76, 1 son '79; 4 grandchildren. Work Chief of Staff, retired. Very active: sports, civic activities.  Social Determinants of Health   Financial Resource Strain: Not on file  Food Insecurity: Not on file  Transportation Needs: Not on file  Physical Activity: Not on file  Stress: Not on file  Social Connections: Not on file  Intimate Partner Violence: Not on file    Family History  Problem Relation Age of Onset   Cancer Father        lymphoma   Dementia Mother    Dementia Paternal Aunt    Colon cancer Neg Hx    Rectal cancer Neg Hx    Stomach cancer Neg Hx    Esophageal cancer Neg Hx     ROS- All systems are reviewed and negative except as per the HPI above  Physical Exam: Vitals:   02/14/22 0831  BP: 136/76  Pulse: 64  Weight: 75.2 kg  Height: 5\' 9"  (1.753 m)   Wt Readings from Last 3 Encounters:  02/14/22 75.2 kg  05/14/21 75.7 kg  03/31/20 76 kg    Labs: Lab Results   Component Value Date   NA 141 01/20/2019   K 4.8 01/20/2019   CL 108 01/20/2019   CO2 25 01/20/2019   GLUCOSE 75 01/20/2019   BUN 19 01/20/2019   CREATININE 1.13 01/20/2019   CALCIUM 8.9 01/20/2019   No results found for: "INR" Lab Results  Component Value Date   CHOL 113 05/29/2020   HDL 59 05/29/2020   LDLCALC 43 05/29/2020   TRIG 56 05/29/2020     GEN- The patient is well appearing, alert and oriented x 3 today.   Head- normocephalic, atraumatic Eyes-  Sclera clear, conjunctiva pink Ears- hearing intact Oropharynx- clear Neck- supple, no JVP Lymph- no cervical lymphadenopathy Lungs- Clear to ausculation bilaterally, normal work of breathing Heart- irregular rate and rhythm, no murmurs, rubs or gallops, PMI not laterally displaced GI- soft, NT, ND, + BS Extremities- no clubbing, cyanosis, or edema MS- no significant deformity or atrophy Skin- no rash or lesion Psych- euthymic mood, full affect Neuro- strength and sensation are intact  EKG-Vent. rate 64 BPM PR interval * ms QRS duration 94 ms QT/QTcB 382/394 ms P-R-T axes * 72 51 Atrial fibrillation Abnormal ECG When compared with ECG of 14-May-2021 10:10, PREVIOUS ECG IS PRESENT Atrial fibrillation New since previous tracing Confirmed by Kirk Ruths [id number] on 02/14/2022 8:52:29 AM    Assessment and Plan:  1. New onset atrial fibrillation  General eduction re atrial flutter and triggers discussed  I discussed the usual approach is to have pt on anticoagulation for at least 21 days and then procedure to cardioversion  He does not feel symptomatic in aflutter and not sure if he wants to proceed with this He is due to see Dr. Aundra Dubin in December so I will ask for an appointment with Dr. Aundra Dubin so cardioversion can be further discussed   2. CHA2DS2VASc score of 3 Continue with eliquis 5 mg bid  Bleeding precautions discussed  First full day was 11/28  He was advised to stop asa but I will review with  Dr. Aundra Dubin if he feels asa needs to be continued He will stop tumeric as well  Appointment requested with Dr. Brita Romp C. Kendel Bessey, [facility] [address] [place], [place] [address] [phone]

## 2022-02-18 ENCOUNTER — Telehealth (HOSPITAL_COMMUNITY): Payer: Self-pay | Admitting: *Deleted

## 2022-02-18 ENCOUNTER — Other Ambulatory Visit (HOSPITAL_COMMUNITY): Payer: Self-pay

## 2022-02-18 MED ORDER — APIXABAN 5 MG PO TABS: 5.0000 mg | ORAL_TABLET | Freq: Two times a day (BID) | ORAL | 1 refills | Status: DC

## 2022-02-18 NOTE — Telephone Encounter (Signed)
Patient called in to report on Friday evening he had an event while at a social gathering as he described "the world closing in". He is unsure if he actually lost consciousness but EMS was called with full assessment which was all normal. His HR/BP continues to be normal. Since the event patient has felt back to baseline. Pt does endorse playing 18 holes of golf with 8 miles of walking which he may have been dehydrated from. ER precautions were reviewed with patient.

## 2022-02-27 ENCOUNTER — Encounter (HOSPITAL_COMMUNITY): Payer: Self-pay | Admitting: Cardiology

## 2022-02-27 ENCOUNTER — Ambulatory Visit (HOSPITAL_COMMUNITY)
Admission: RE | Admit: 2022-02-27 | Discharge: 2022-02-27 | Disposition: A | Discharge status: Home / Self Care | Payer: Medicare Other | Source: Ambulatory Visit | Attending: Cardiology | Admitting: Cardiology

## 2022-02-27 VITALS — BP 130/80 | HR 64 | Wt 163.2 lb

## 2022-02-27 DIAGNOSIS — I2510 Atherosclerotic heart disease of native coronary artery without angina pectoris: Secondary | ICD-10-CM | POA: Diagnosis not present

## 2022-02-27 DIAGNOSIS — R0600 Dyspnea, unspecified: Secondary | ICD-10-CM | POA: Insufficient documentation

## 2022-02-27 DIAGNOSIS — E785 Hyperlipidemia, unspecified: Secondary | ICD-10-CM | POA: Insufficient documentation

## 2022-02-27 DIAGNOSIS — Z7901 Long term (current) use of anticoagulants: Secondary | ICD-10-CM | POA: Diagnosis not present

## 2022-02-27 DIAGNOSIS — Z79899 Other long term (current) drug therapy: Secondary | ICD-10-CM | POA: Insufficient documentation

## 2022-02-27 DIAGNOSIS — I4891 Unspecified atrial fibrillation: Secondary | ICD-10-CM | POA: Diagnosis not present

## 2022-02-27 DIAGNOSIS — I483 Typical atrial flutter: Secondary | ICD-10-CM | POA: Insufficient documentation

## 2022-02-27 LAB — BASIC METABOLIC PANEL
Anion gap: 8 (ref 5–15)
BUN: 21 mg/dL (ref 8–23)
CO2: 20 mmol/L — ABNORMAL LOW (ref 22–32)
Calcium: 8.7 mg/dL — ABNORMAL LOW (ref 8.9–10.3)
Chloride: 114 mmol/L — ABNORMAL HIGH (ref 98–111)
Creatinine, Ser: 1.15 mg/dL (ref 0.61–1.24)
GFR, Estimated: >60 mL/min (ref >60)
Glucose, Bld: 88 mg/dL (ref 70–99)
Potassium: 4 mmol/L (ref 3.5–5.1)
Sodium: 142 mmol/L (ref 135–145)

## 2022-02-27 LAB — LIPID PANEL
Cholesterol: 93 mg/dL (ref 0–200)
HDL: 50 mg/dL (ref >40)
LDL Cholesterol: 37 mg/dL (ref 0–99)
Total CHOL/HDL Ratio: 1.9 RATIO
Triglycerides: 31 mg/dL (ref <150)
VLDL: 6 mg/dL (ref 0–40)

## 2022-02-27 LAB — CBC
HCT: 45.1 % (ref 39.0–52.0)
Hemoglobin: 15.5 g/dL (ref 13.0–17.0)
MCH: 31.6 pg (ref 26.0–34.0)
MCHC: 34.4 g/dL (ref 30.0–36.0)
MCV: 91.9 fL (ref 80.0–100.0)
Platelets: 213 10*3/uL (ref 150–400)
RBC: 4.91 MIL/uL (ref 4.22–5.81)
RDW: 12.1 % (ref 11.5–15.5)
WBC: 5 10*3/uL (ref 4.0–10.5)
nRBC: 0 % (ref 0.0–0.2)

## 2022-02-27 LAB — BRAIN NATRIURETIC PEPTIDE: B Natriuretic Peptide: 68.6 pg/mL (ref 0.0–100.0)

## 2022-02-27 LAB — TSH: TSH: 2.441 u[IU]/mL (ref 0.350–4.500)

## 2022-02-27 NOTE — H&P (View-Only) (Signed)
ID:  RHYE KNOPE, DOB [birth year], MRN [id number]   Provider location: [facility] Type of Visit: Established patient   PCP:  Crist Infante, MD  Cardiologist:  Dr. Aundra Dubin   History of Present Illness: Walter George is a 76 y.o. with hyperlipidemia and abnormal calcium score scan who presents for followup of CAD and atrial flutter.  He had a screening coronary calcium score done in 10/20.  This showed 1654 Agatston units, placing him in the 91st percentile for his age and gender.  He is now on atorvastatin. Cardiolite in 10/20 showed no ischemia or infarction.  Of note, his younger brother died suddenly, autopsy showed he had a myocardial infarction.   He saw his PCP in 11/23, HR was irregular on exam and ECG showed atrial fibrillation or flutter.  He was sent to atrial fibrillation clinic, with ECG showing atrial flutter.    He has been mostly asymptomatic.  He does not feel palpitations.  He notes mild dyspnea walking up a hill but this is unchanged.  He plays both golf and tennis weekly and walks for exercise.  He has noted no significant change in his excellent exercise tolerance.  No chest pain.  He does not snore or have daytime sleepiness.  He is not a heavy drinker.  He was at a dinner about a week ago and had a presyncopal episode associated with diaphoresis while eating.  He did not pass out and symptoms resolved. By the time EMS arrived, HR and BP were in normal range. He has not had a recurrence of this. He had been golfing that day and thinks he could have been dehydrated. He remains in atrial flutter today.   Labs (9/20): LDL 93, HDL 71, K 4.8, creatinine 1.3 Labs (11/20): K 4.8, creatinine 1.13 Labs (12/20): LDL 63, HDL 63 Labs (9/21): LDL 61, HDL 71, TGs 66 Labs (3/22): LDL 43, HDL 59  ECG (personally reviewed): Typical atrial flutter, 4:1 block.   PMH: 1. Nephrolithiasis 2. Hyperlipidemia 3. CAD: Coronary calcium score scan in  10/20 with 1654 Agatston units, placing the patient in the 91st percentile for age/gender. - Echo (11/18): EF 55-60%, mild LVH.   - ETT-Cardiolite (10/20) with 16 METS, no significant ST changes, EF 61%, no ischemia/infarction.  4. 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  Occasional ETOH, never smoker.   FH: Father died with lymphoma, mother had dementia and died at 57.  Younger brother with SCD from MI.   ROS: All systems reviewed and negative except as per HPI.   Current Outpatient Medications  Medication Sig Dispense Refill   apixaban (ELIQUIS) 5 MG TABS tablet Take 1 tablet (5 mg total) by mouth 2 (two) times daily. 180 tablet 1   atorvastatin (LIPITOR) 80 MG tablet Take 1 tablet (80 mg total) by mouth daily. 90 tablet 3   ezetimibe (ZETIA) 10 MG tablet Take 1 tablet (10 mg total) by mouth daily. 90 tablet 3   Multiple Vitamin (MULTIVITAMIN WITH MINERALS) TABS tablet Take 1 tablet by mouth daily.     ramipril (ALTACE) 1.25 MG capsule TAKE 1 CAPSULE(1.25 MG) BY MOUTH DAILY 90 capsule 11   aspirin EC 81 MG tablet Take 1 tablet (81 mg total) by mouth daily. (Patient not taking: Reported on 05/14/2021) 90 tablet 3   No current facility-administered medications for this encounter.   BP 130/80   Pulse 64   Wt  74 kg (163 lb 3.2 oz)   SpO2 97%   BMI 24.10 kg/m  General: NAD Neck: No JVD, no thyromegaly or thyroid nodule.  Lungs: Clear to auscultation bilaterally with normal respiratory effort. CV: Nondisplaced PMI.  Heart regular S1/S2, no S3/S4, no murmur.  No peripheral edema.  No carotid bruit.  Normal pedal pulses.  Abdomen: Soft, nontender, no hepatosplenomegaly, no distention.  Skin: Intact without lesions or rashes.  Neurologic: Alert and oriented x 3.  Psych: Normal affect. Extremities: No clubbing or cyanosis.  HEENT: Normal.   Assessment/Plan: 1. CAD: Patient had a coronary calcium score placing him the 91st percentile for age  and gender.  The absolute calcium score number suggests significant risk for obstructive coronary disease.  ETT-Cardiolite in 10/20 was low risk with no ischemia/infarction on perfusion images and excellent exercise tolerance.  No ischemic symptoms.  - Off ASA with Eliquis use.  - Continue low-dose ramipril for primary prevention of cardiac disease. BMET today.  - Goal LDL < 55.  Continue atorvastatin 80 mg daily and Zetia 10 mg daily, check lipids today.   2. Hyperlipidemia: Goal LDL < 55 ideally.    - Check lipids today.  3. Atrial flutter (?fibrillation): He is in atrial flutter today, 4:1 block with controlled HR. CHADSVASC 2.  He appears to be persistently in flutter for at least a couple of weeks now.  He is not particularly symptomatic (maybe a bit more fatigued walking up a hill).   - Continue Eliquis, check CBC.  - I would favor trial of DCCV.  If he hold NSR, would just monitor.  If atrial flutter recurs, would consider ablation. Will arrange for this after 4 uninterrupted weeks of Eliquis. We discussed risks/benefits and he agrees to procedure.  - I will arrange for echo.  - Check TSH.   Followup 2 months.   Signed, Marca Ancona, MD  02/27/2022  [facility] [address] [facility] [place] [address] [phone] (office) [phone] (fax)

## 2022-02-27 NOTE — Patient Instructions (Signed)
There has been no changes to your medications.  Labs done today, your results will be available in MyChart, we will contact you for abnormal readings.  We will call you tomorrow to arrange your Cardioversion.  Your physician recommends that you schedule a follow-up appointment in: 3 months  If you have any questions or concerns before your next appointment please send [place] a message through [place] or call our office at [phone].    TO LEAVE A MESSAGE FOR THE NURSE SELECT OPTION 2, PLEASE LEAVE A MESSAGE INCLUDING: YOUR NAME DATE OF BIRTH CALL BACK NUMBER REASON FOR CALL**this is important as we prioritize the call backs  YOU WILL RECEIVE A CALL BACK THE SAME DAY AS LONG AS YOU CALL BEFORE 4:00 PM  At the [facility], you and your health needs are our priority. As part of our continuing mission to provide you with exceptional heart care, we have created designated Provider Care Teams. These Care Teams include your primary Cardiologist (physician) and Advanced Practice Providers (APPs- Physician Assistants and Nurse Practitioners) who all work together to provide you with the care you need, when you need it.   You may see any of the following providers on your designated Care Team at your next follow up: Dr Arvilla Meres Dr Marca Ancona Dr. Marcos Eke, NP Robbie Lis, [facility] [place], [place] Brynda Peon, NP Karle Plumber, PharmD   Please be sure to bring in all your medications bottles to every appointment.

## 2022-02-27 NOTE — Progress Notes (Signed)
ID:  Walter George, DOB 1945/07/16, MRN [id number]   Provider location: [facility] Type of Visit: Established patient   PCP:  Crist Infante, MD  Cardiologist:  Dr. Aundra Dubin   History of Present Illness: Walter George is a 76 y.o. with hyperlipidemia and abnormal calcium score scan who presents for followup of CAD and atrial flutter.  He had a screening coronary calcium score done in 10/20.  This showed 1654 Agatston units, placing him in the 91st percentile for his age and gender.  He is now on atorvastatin. Cardiolite in 10/20 showed no ischemia or infarction.  Of note, his younger brother died suddenly, autopsy showed he had a myocardial infarction.   He saw his PCP in 11/23, HR was irregular on exam and ECG showed atrial fibrillation or flutter.  He was sent to atrial fibrillation clinic, with ECG showing atrial flutter.    He has been mostly asymptomatic.  He does not feel palpitations.  He notes mild dyspnea walking up a hill but this is unchanged.  He plays both golf and tennis weekly and walks for exercise.  He has noted no significant change in his excellent exercise tolerance.  No chest pain.  He does not snore or have daytime sleepiness.  He is not a heavy drinker.  He was at a dinner about a week ago and had a presyncopal episode associated with diaphoresis while eating.  He did not pass out and symptoms resolved. By the time EMS arrived, HR and BP were in normal range. He has not had a recurrence of this. He had been golfing that day and thinks he could have been dehydrated. He remains in atrial flutter today.   Labs (9/20): LDL 93, HDL 71, K 4.8, creatinine 1.3 Labs (11/20): K 4.8, creatinine 1.13 Labs (12/20): LDL 63, HDL 63 Labs (9/21): LDL 61, HDL 71, TGs 66 Labs (3/22): LDL 43, HDL 59  ECG (personally reviewed): Typical atrial flutter, 4:1 block.   PMH: 1. Nephrolithiasis 2. Hyperlipidemia 3. CAD: Coronary calcium score scan in  10/20 with 1654 Agatston units, placing the patient in the 91st percentile for age/gender. - Echo (11/18): EF 55-60%, mild LVH.   - ETT-Cardiolite (10/20) with 16 METS, no significant ST changes, EF 61%, no ischemia/infarction.  4. 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  Occasional ETOH, never smoker.   FH: Father died with lymphoma, mother had dementia and died at 95.  Younger brother with SCD from MI.   ROS: All systems reviewed and negative except as per HPI.   Current Outpatient Medications  Medication Sig Dispense Refill   apixaban (ELIQUIS) 5 MG TABS tablet Take 1 tablet (5 mg total) by mouth 2 (two) times daily. 180 tablet 1   atorvastatin (LIPITOR) 80 MG tablet Take 1 tablet (80 mg total) by mouth daily. 90 tablet 3   ezetimibe (ZETIA) 10 MG tablet Take 1 tablet (10 mg total) by mouth daily. 90 tablet 3   Multiple Vitamin (MULTIVITAMIN WITH MINERALS) TABS tablet Take 1 tablet by mouth daily.     ramipril (ALTACE) 1.25 MG capsule TAKE 1 CAPSULE(1.25 MG) BY MOUTH DAILY 90 capsule 11   aspirin EC 81 MG tablet Take 1 tablet (81 mg total) by mouth daily. (Patient not taking: Reported on 05/14/2021) 90 tablet 3   No current facility-administered medications for this encounter.   BP 130/80   Pulse 64   Wt  74 kg (163 lb 3.2 oz)   SpO2 97%   BMI 24.10 kg/m  General: NAD Neck: No JVD, no thyromegaly or thyroid nodule.  Lungs: Clear to auscultation bilaterally with normal respiratory effort. CV: Nondisplaced PMI.  Heart regular S1/S2, no S3/S4, no murmur.  No peripheral edema.  No carotid bruit.  Normal pedal pulses.  Abdomen: Soft, nontender, no hepatosplenomegaly, no distention.  Skin: Intact without lesions or rashes.  Neurologic: Alert and oriented x 3.  Psych: Normal affect. Extremities: No clubbing or cyanosis.  HEENT: Normal.   Assessment/Plan: 1. CAD: Patient had a coronary calcium score placing him the 91st percentile for age  and gender.  The absolute calcium score number suggests significant risk for obstructive coronary disease.  ETT-Cardiolite in 10/20 was low risk with no ischemia/infarction on perfusion images and excellent exercise tolerance.  No ischemic symptoms.  - Off ASA with Eliquis use.  - Continue low-dose ramipril for primary prevention of cardiac disease. BMET today.  - Goal LDL < 55.  Continue atorvastatin 80 mg daily and Zetia 10 mg daily, check lipids today.   2. Hyperlipidemia: Goal LDL < 55 ideally.    - Check lipids today.  3. Atrial flutter (?fibrillation): He is in atrial flutter today, 4:1 block with controlled HR. CHADSVASC 2.  He appears to be persistently in flutter for at least a couple of weeks now.  He is not particularly symptomatic (maybe a bit more fatigued walking up a hill).   - Continue Eliquis, check CBC.  - I would favor trial of DCCV.  If he hold NSR, would just monitor.  If atrial flutter recurs, would consider ablation. Will arrange for this after 4 uninterrupted weeks of Eliquis. We discussed risks/benefits and he agrees to procedure.  - I will arrange for echo.  - Check TSH.   Followup 2 months.   Signed, Marca Ancona, MD  02/27/2022  [facility] [address] [facility] [place] [address] [phone] (office) [phone] (fax)

## 2022-02-28 ENCOUNTER — Other Ambulatory Visit (HOSPITAL_COMMUNITY): Payer: Self-pay

## 2022-02-28 ENCOUNTER — Telehealth (HOSPITAL_COMMUNITY): Payer: Self-pay

## 2022-02-28 ENCOUNTER — Encounter (HOSPITAL_COMMUNITY): Payer: Self-pay

## 2022-02-28 DIAGNOSIS — I4891 Unspecified atrial fibrillation: Secondary | ICD-10-CM

## 2022-02-28 NOTE — Telephone Encounter (Signed)
Patient called to schedule DCCV. Procedure arranged for 03/26/22 at 9am. Arrival time is 8 am. Letter with instructions mailed to patient.

## 2022-03-22 ENCOUNTER — Other Ambulatory Visit: Payer: Self-pay | Admitting: *Deleted

## 2022-03-22 MED ORDER — RAMIPRIL 1.25 MG PO CAPS: ORAL_CAPSULE | ORAL | 11 refills | Status: DC

## 2022-03-25 ENCOUNTER — Telehealth (HOSPITAL_COMMUNITY): Payer: Self-pay

## 2022-03-25 DIAGNOSIS — Z85828 Personal history of other malignant neoplasm of skin: Secondary | ICD-10-CM | POA: Diagnosis not present

## 2022-03-25 DIAGNOSIS — L570 Actinic keratosis: Secondary | ICD-10-CM | POA: Diagnosis not present

## 2022-03-25 DIAGNOSIS — L820 Inflamed seborrheic keratosis: Secondary | ICD-10-CM | POA: Diagnosis not present

## 2022-03-25 DIAGNOSIS — L853 Xerosis cutis: Secondary | ICD-10-CM | POA: Diagnosis not present

## 2022-03-25 DIAGNOSIS — L821 Other seborrheic keratosis: Secondary | ICD-10-CM | POA: Diagnosis not present

## 2022-03-25 NOTE — Telephone Encounter (Signed)
Patient called to remind him of cardioversion scheduled for tomorrow.Aware of time of procedure,nothing to eat or drink after midnight. Not missing Eliquis dose. Has transportation to and from hospital

## 2022-03-26 ENCOUNTER — Other Ambulatory Visit: Payer: Self-pay

## 2022-03-26 ENCOUNTER — Encounter (HOSPITAL_COMMUNITY): Payer: Self-pay | Admitting: Cardiology

## 2022-03-26 ENCOUNTER — Encounter (HOSPITAL_COMMUNITY)
Admission: RE | Disposition: A | Discharge status: Home / Self Care | Payer: Self-pay | Source: Home / Self Care | Attending: Cardiology

## 2022-03-26 ENCOUNTER — Ambulatory Visit (HOSPITAL_COMMUNITY)
Admission: RE | Admit: 2022-03-26 | Discharge: 2022-03-26 | Disposition: A | Discharge status: Home / Self Care | Payer: Medicare Other | Attending: Cardiology | Admitting: Cardiology

## 2022-03-26 ENCOUNTER — Ambulatory Visit (HOSPITAL_COMMUNITY): Payer: Medicare Other | Admitting: Certified Registered Nurse Anesthetist

## 2022-03-26 ENCOUNTER — Ambulatory Visit (HOSPITAL_BASED_OUTPATIENT_CLINIC_OR_DEPARTMENT_OTHER): Payer: Medicare Other | Admitting: Certified Registered Nurse Anesthetist

## 2022-03-26 DIAGNOSIS — I2510 Atherosclerotic heart disease of native coronary artery without angina pectoris: Secondary | ICD-10-CM | POA: Insufficient documentation

## 2022-03-26 DIAGNOSIS — Z8249 Family history of ischemic heart disease and other diseases of the circulatory system: Secondary | ICD-10-CM | POA: Diagnosis not present

## 2022-03-26 DIAGNOSIS — E785 Hyperlipidemia, unspecified: Secondary | ICD-10-CM | POA: Insufficient documentation

## 2022-03-26 DIAGNOSIS — I110 Hypertensive heart disease with heart failure: Secondary | ICD-10-CM | POA: Diagnosis not present

## 2022-03-26 DIAGNOSIS — I4891 Unspecified atrial fibrillation: Secondary | ICD-10-CM | POA: Insufficient documentation

## 2022-03-26 DIAGNOSIS — N289 Disorder of kidney and ureter, unspecified: Secondary | ICD-10-CM

## 2022-03-26 DIAGNOSIS — I509 Heart failure, unspecified: Secondary | ICD-10-CM

## 2022-03-26 DIAGNOSIS — I483 Typical atrial flutter: Secondary | ICD-10-CM | POA: Diagnosis not present

## 2022-03-26 DIAGNOSIS — Z79899 Other long term (current) drug therapy: Secondary | ICD-10-CM | POA: Diagnosis not present

## 2022-03-26 DIAGNOSIS — Z7901 Long term (current) use of anticoagulants: Secondary | ICD-10-CM | POA: Diagnosis not present

## 2022-03-26 DIAGNOSIS — M1990 Unspecified osteoarthritis, unspecified site: Secondary | ICD-10-CM

## 2022-03-26 HISTORY — PX: CARDIOVERSION: SHX1299

## 2022-03-26 SURGERY — CARDIOVERSION
Anesthesia: General

## 2022-03-26 MED ORDER — SODIUM CHLORIDE 0.9 % IV SOLN: INTRAVENOUS | Status: DC | PRN

## 2022-03-26 MED ORDER — LACTATED RINGERS IV SOLN: INTRAVENOUS | Status: DC

## 2022-03-26 MED ORDER — LIDOCAINE 2% (20 MG/ML) 5 ML SYRINGE
INTRAMUSCULAR | Status: DC | PRN
  Administered 2022-03-26: 60 mg via INTRAVENOUS

## 2022-03-26 MED ORDER — SODIUM CHLORIDE 0.9 % IV SOLN: INTRAVENOUS | Status: DC

## 2022-03-26 MED ORDER — PROPOFOL 10 MG/ML IV BOLUS
INTRAVENOUS | Status: DC | PRN
  Administered 2022-03-26: 70 mg via INTRAVENOUS

## 2022-03-26 NOTE — Interval H&P Note (Signed)
History and Physical Interval Note:  03/26/2022 9:15 AM  Walter George  has presented today for surgery, with the diagnosis of afib.  The various methods of treatment have been discussed with the patient and family. After consideration of risks, benefits and other options for treatment, the patient has consented to  Procedure(s): CARDIOVERSION (N/A) as a surgical intervention.  The patient's history has been reviewed, patient examined, no change in status, stable for surgery.  I have reviewed the patient's chart and labs.  Questions were answered to the patient's satisfaction.     Kainalu Heggs [employer]

## 2022-03-26 NOTE — Transfer of Care (Signed)
Immediate Anesthesia Transfer of Care Note  Patient: Walter George  Procedure(s) Performed: CARDIOVERSION  Patient Location: Endoscopy Unit  Anesthesia Type:MAC  Level of Consciousness: drowsy  Airway & Oxygen Therapy: Patient Spontanous Breathing  Post-op Assessment: Report given to RN and Post -op Vital signs reviewed and stable  Post vital signs: Reviewed and stable  Last Vitals:  Vitals Value Taken Time  BP 143/88   Temp    Pulse 63   Resp 16   SpO2 97%     Last Pain:  Vitals:   03/26/22 0818  TempSrc: Temporal  PainSc: 0-No pain         Complications: No notable events documented.

## 2022-03-26 NOTE — Procedures (Signed)
Electrical Cardioversion Procedure Note Walter George [id number] 11/19/1945  Procedure: Electrical Cardioversion Indications:  Atrial Flutter  Procedure Details Consent: Risks of procedure as well as the alternatives and risks of each were explained to the (patient/caregiver).  Consent for procedure obtained. Time Out: Verified patient identification, verified procedure, site/side was marked, verified correct patient position, special equipment/implants available, medications/allergies/relevent history reviewed, required imaging and test results available.  Performed  Patient placed on cardiac monitor, pulse oximetry, supplemental oxygen as necessary.  Sedation given:  Propofol per anesthesiology Pacer pads placed anterior and posterior chest.  Cardioverted 2 time(s).  Cardioverted at [place].  Evaluation Findings: Post procedure EKG shows: NSR.  Initial cardioversion atrial flutter => atrial fibrillation.  Repeat cardioversion atrial fibrillation => NSR.  Complications: None Patient did tolerate procedure well.   Loralie Champagne 03/26/2022, 9:26 AM

## 2022-03-27 NOTE — Anesthesia Postprocedure Evaluation (Signed)
Anesthesia Post Note  Patient: Walter George  Procedure(s) Performed: CARDIOVERSION     Patient location during evaluation: Endoscopy Anesthesia Type: General Level of consciousness: awake and alert Pain management: pain level controlled Vital Signs Assessment: post-procedure vital signs reviewed and stable Respiratory status: spontaneous breathing, nonlabored ventilation, respiratory function stable and patient connected to nasal cannula oxygen Cardiovascular status: blood pressure returned to baseline and stable Postop Assessment: no apparent nausea or vomiting Anesthetic complications: no   No notable events documented.  Last Vitals:  Vitals:   03/26/22 0940 03/26/22 0946  BP: 117/77 115/87  Pulse: 61 (!) 59  Resp: 20 18  Temp:    SpO2: 97% 97%    Last Pain:  Vitals:   03/26/22 0946  TempSrc:   PainSc: 0-No pain                 [name]

## 2022-03-27 NOTE — Anesthesia Preprocedure Evaluation (Signed)
Anesthesia Evaluation  Patient identified by MRN, date of birth, ID band Patient awake    Reviewed: Allergy & Precautions, H&P , NPO status , Patient's Chart, lab work & pertinent test results  Airway Mallampati: II   Neck ROM: full    Dental   Pulmonary neg pulmonary ROS   breath sounds clear to auscultation       Cardiovascular +CHF  + dysrhythmias Atrial Fibrillation  Rhythm:irregular Rate:Normal     Neuro/Psych    GI/Hepatic   Endo/Other    Renal/GU Renal disease     Musculoskeletal  (+) Arthritis ,    Abdominal   Peds  Hematology   Anesthesia Other Findings   Reproductive/Obstetrics                              Anesthesia Physical Anesthesia Plan  ASA: 3  Anesthesia Plan: General   Post-op Pain Management:    Induction: Intravenous  PONV Risk Score and Plan: 2 and Propofol infusion and Treatment may vary due to age or medical condition  Airway Management Planned: Mask  Additional Equipment:   Intra-op Plan:   Post-operative Plan:   Informed Consent: I have reviewed the patients History and Physical, chart, labs and discussed the procedure including the risks, benefits and alternatives for the proposed anesthesia with the patient or authorized representative who has indicated his/her understanding and acceptance.     Dental advisory given  Plan Discussed with: CRNA, Anesthesiologist and Surgeon  Anesthesia Plan Comments:          Anesthesia Quick Evaluation

## 2022-03-29 ENCOUNTER — Encounter (HOSPITAL_COMMUNITY): Payer: Self-pay | Admitting: Cardiology

## 2022-04-01 ENCOUNTER — Telehealth (HOSPITAL_COMMUNITY): Payer: Self-pay | Admitting: *Deleted

## 2022-04-01 NOTE — Telephone Encounter (Signed)
Pt left vm stating he is back in afib and was told to let Dr.Mclean know.   Routed to [place]

## 2022-04-01 NOTE — Telephone Encounter (Signed)
Would like him to come by today if possible to get an ECG to see if he is in flutter or fibrillation.  Would also like to refer to EP for consideration of ablation.

## 2022-04-01 NOTE — Telephone Encounter (Signed)
Left vm for pt to return call

## 2022-04-02 ENCOUNTER — Ambulatory Visit (HOSPITAL_COMMUNITY)
Admission: RE | Admit: 2022-04-02 | Discharge: 2022-04-02 | Disposition: A | Discharge status: Home / Self Care | Payer: Medicare Other | Source: Ambulatory Visit | Attending: Cardiology | Admitting: Cardiology

## 2022-04-02 VITALS — BP 110/70 | HR 63

## 2022-04-02 DIAGNOSIS — R001 Bradycardia, unspecified: Secondary | ICD-10-CM | POA: Insufficient documentation

## 2022-04-02 DIAGNOSIS — I4891 Unspecified atrial fibrillation: Secondary | ICD-10-CM | POA: Diagnosis not present

## 2022-04-10 ENCOUNTER — Telehealth (HOSPITAL_COMMUNITY): Payer: Self-pay

## 2022-04-10 NOTE — Telephone Encounter (Signed)
Patient called and wanted to see if Dr. Aundra Dubin had gotten his emails regarding going out of town for 3 weeks and his A Fib. Did you receive? He asked if you could call back.

## 2022-04-10 NOTE — Telephone Encounter (Signed)
I am not getting his emails.  Tell him to try emailing me at [email or web address]. The cone email may be sending him to spam for some reason.  I will try to call him tomorrow some time.

## 2022-04-11 ENCOUNTER — Encounter (HOSPITAL_COMMUNITY): Payer: Self-pay

## 2022-04-11 NOTE — Telephone Encounter (Signed)
Unable to reach by phone. MyChart message sent to patient.

## 2022-04-12 ENCOUNTER — Telehealth (HOSPITAL_COMMUNITY): Payer: Self-pay | Admitting: Cardiology

## 2022-04-12 DIAGNOSIS — I4891 Unspecified atrial fibrillation: Secondary | ICD-10-CM

## 2022-04-12 NOTE — Telephone Encounter (Signed)
Per Dr Aundra Dubin Pt will need to see EP ASAP for afib/flutter ablation  -since patient is not established with EP new referral placed with urgent

## 2022-04-13 NOTE — Progress Notes (Deleted)
Electrophysiology Office Note   Date:  04/13/2022   ID:  Walter George, DOB [birth year], MRN [id number]  PCP:  Crist Infante, MD  Cardiologist:  Aundra Dubin Primary Electrophysiologist:  Holden Draughon Meredith Leeds, MD    Chief Complaint: AF   History of Present Illness: Walter George is a 77 y.o. male who is being seen today for the evaluation of AF at the request of Larey Dresser, MD. Presenting today for electrophysiology evaluation.  He has a history for hyperlipidemia, elevated coronary artery calcium score of 1654 with a nonischemic Myoview, atrial fibrillation.  He was seen by his PCP November 2023 with atrial fibrillation.  He has now status post cardioversion 03/26/2022.  He has dyspnea when walking up a hill, but this is unchanged from previous.  He plays golf and tennis weekly and walks for exercise.  Today, he denies*** symptoms of palpitations, chest pain, shortness of breath, orthopnea, PND, lower extremity edema, claudication, dizziness, presyncope, syncope, bleeding, or neurologic sequela. The patient is tolerating medications without difficulties.    Past Medical History:  Diagnosis Date   CHF (congestive heart failure) ([place])    Kidney stones    3 stones, last 20 years ago.   Plantar fasciitis    Past Surgical History:  Procedure Laterality Date   CARDIOVERSION N/A 03/26/2022   Procedure: CARDIOVERSION;  Surgeon: Larey Dresser, MD;  Location: [facility] ENDOSCOPY;  Service: Cardiovascular;  Laterality: N/A;   COLONOSCOPY     kidney stone removal     x2   TONSILLECTOMY  1953     Current Outpatient Medications  Medication Sig Dispense Refill   acetaminophen (TYLENOL) 500 MG tablet Take 500-1,000 mg by mouth every 6 (six) hours as needed for headache or moderate pain.     apixaban (ELIQUIS) 5 MG TABS tablet Take 1 tablet (5 mg total) by mouth 2 (two) times daily. 180 tablet 1   atorvastatin (LIPITOR) 80 MG tablet Take 1 tablet (80 mg total) by mouth daily. 90 tablet  3   Cholecalciferol-Vitamin C (VITAMIN D3-VITAMIN C PO) Take 1 tablet by mouth daily.     ezetimibe (ZETIA) 10 MG tablet Take 1 tablet (10 mg total) by mouth daily. 90 tablet 3   Multiple Vitamin (MULTIVITAMIN WITH MINERALS) TABS tablet Take 1 tablet by mouth daily.     ramipril (ALTACE) 1.25 MG capsule TAKE 1 CAPSULE(1.25 MG) BY MOUTH DAILY 90 capsule 11   No current facility-administered medications for this visit.    Allergies:   Patient has no known allergies.   Social History:  The patient  reports that he has never smoked. He has never used smokeless tobacco. He reports current alcohol use of about 9.0 standard drinks of alcohol per week. He reports that he does not use drugs.   Family History:  The patient's family history includes Cancer in his father; Dementia in his mother and paternal aunt.    ROS:  Please see the history of present illness.   Otherwise, review of systems is positive for none.   All other systems are reviewed and negative.    PHYSICAL EXAM: VS:  There were no vitals taken for this visit. , BMI There is no height or weight on file to calculate BMI. GEN: Well nourished, well developed, in no acute distress  HEENT: normal  Neck: no JVD, carotid bruits, or masses Cardiac: ***RRR; no murmurs, rubs, or gallops,no edema  Respiratory:  clear to auscultation bilaterally, normal work of breathing GI: soft, nontender,  nondistended, + BS MS: no deformity or atrophy  Skin: warm and dry Neuro:  Strength and sensation are intact Psych: euthymic mood, full affect  EKG:  EKG {ACTION; IS/IS [id number] ordered today. Personal review of the ekg ordered *** shows ***  Recent Labs: 02/27/2022: B Natriuretic Peptide 68.6; BUN 21; Creatinine, Ser 1.15; Hemoglobin 15.5; Platelets 213; Potassium 4.0; Sodium 142; TSH 2.441    Lipid Panel     Component Value Date/Time   CHOL 93 02/27/2022 1650   TRIG 31 02/27/2022 1650   HDL 50 02/27/2022 1650   CHOLHDL 1.9 02/27/2022  1650   VLDL 6 02/27/2022 1650   LDLCALC 37 02/27/2022 1650   LDLDIRECT 126.2 02/26/2011 1556     Wt Readings from Last 3 Encounters:  03/26/22 160 lb (72.6 kg)  02/27/22 163 lb 3.2 oz (74 kg)  02/14/22 165 lb 12.8 oz (75.2 kg)      Other studies Reviewed: Additional studies/ records that were reviewed today include: TTE 2018  Review of the above records today demonstrates:  - Left ventricle: The cavity size was normal. There was mild    concentric hypertrophy. Systolic function was normal. The    estimated ejection fraction was in the range of 55% to 60%. Wall    motion was normal; there were no regional wall motion    abnormalities. Left ventricular diastolic function parameters    were normal.  - Mitral valve: Mildly thickened leaflets . There was trivial    regurgitation.  - Left atrium: The atrium was normal in size.  - Right atrium: The atrium was mildly dilated.  - Inferior vena cava: The vessel was normal in size. The    respirophasic diameter changes were in the normal range (>= 50%),    consistent with normal central venous pressure.   Myoview 2020 Excellent exercise capacity (16 METS) There was no ST segment deviation noted during stress. The left ventricular ejection fraction is normal (55-65%). Nuclear stress EF: 61%. The study is normal. This is a low risk study.  ASSESSMENT AND PLAN:  1.  Persistent atrial fibrillation/flutter: CHA2DS2-VASc of 2.  Currently on Eliquis 5 mg twice daily.  Cardioversion 03/26/2022.***  2.  Secondary hypercoagulable state: Currently on Eliquis for atrial fibrillation/flutter as above  3.  Coronary artery disease: Significantly elevated calcium score without ischemic symptoms.  Plan per primary cardiology.    Current medicines are reviewed at length with the patient today.   The patient {ACTIONS; HAS/DOES NOT HAVE:19233} concerns regarding his medicines.  The following changes were made today:  {NONE DEFAULTED:18576}  Labs/  tests ordered today include: *** No orders of the defined types were placed in this encounter.    Disposition:   FU with Tamula Morrical {gen number [id number] {Days to years:10300}  Signed, Jaqwon Manfred Meredith Leeds, MD  04/13/2022 12:10 PM     [facility] [address] [place] [address] [phone] (office) [phone] (fax)

## 2022-04-15 ENCOUNTER — Institutional Professional Consult (permissible substitution): Payer: Medicare Other | Admitting: Cardiology

## 2022-04-25 ENCOUNTER — Encounter (HOSPITAL_COMMUNITY): Payer: Self-pay | Admitting: *Deleted

## 2022-05-13 ENCOUNTER — Other Ambulatory Visit (HOSPITAL_COMMUNITY): Payer: Self-pay

## 2022-05-13 ENCOUNTER — Other Ambulatory Visit: Payer: Self-pay

## 2022-05-13 ENCOUNTER — Encounter: Payer: Self-pay | Admitting: Cardiology

## 2022-05-13 ENCOUNTER — Ambulatory Visit: Payer: Medicare Other | Attending: Cardiology | Admitting: Cardiology

## 2022-05-13 VITALS — BP 130/84 | HR 64 | Ht 68.23 in | Wt 161.0 lb

## 2022-05-13 DIAGNOSIS — D6869 Other thrombophilia: Secondary | ICD-10-CM

## 2022-05-13 DIAGNOSIS — I4819 Other persistent atrial fibrillation: Secondary | ICD-10-CM

## 2022-05-13 DIAGNOSIS — I483 Typical atrial flutter: Secondary | ICD-10-CM

## 2022-05-13 DIAGNOSIS — I2510 Atherosclerotic heart disease of native coronary artery without angina pectoris: Secondary | ICD-10-CM

## 2022-05-13 MED ORDER — ATORVASTATIN CALCIUM 80 MG PO TABS: 80.0000 mg | ORAL_TABLET | Freq: Every day | ORAL | 3 refills | Status: DC

## 2022-05-13 NOTE — Patient Instructions (Addendum)
Medication Instructions:  Your physician recommends that you continue on your current medications as directed. Please refer to the Current Medication list given to you today.  *If you need a refill on your cardiac medications before your next appointment, please call your pharmacy*   Lab Work: Pre procedure labs -- see procedure instruction letter:  BMP & CBC  If you have labs (blood work) drawn today and your tests are completely normal, you will receive your results only by: [place] (if you have MyChart) OR A paper copy in the mail If you have any lab test that is abnormal or we need to change your treatment, we will call you to review the results.   Testing/Procedures: Your physician has requested that you have cardiac CT within 7 days PRIOR to your ablation. Cardiac computed tomography (CT) is a painless test that uses an x-ray machine to take clear, detailed pictures of your heart.  Please follow instruction below located under "other instructions". You will get a call from our office to schedule the date for this test.  Your physician has recommended that you have an ablation. Catheter ablation is a medical procedure used to treat some cardiac arrhythmias (irregular heartbeats). During catheter ablation, a long, thin, flexible tube is put into a blood vessel in your groin (upper thigh), or neck. This tube is called an ablation catheter. It is then guided to your heart through the blood vessel. Radio frequency waves destroy small areas of heart tissue where abnormal heartbeats may cause an arrhythmia to start.   EP scheduler will call you to arrange this procedure    Follow-Up: At [facility], you and your health needs are our priority.  As part of our continuing mission to provide you with exceptional heart care, we have created designated Provider Care Teams.  These Care Teams include your primary Cardiologist (physician) and Advanced Practice Providers (APPs -  Physician  Assistants and Nurse Practitioners) who all work together to provide you with the care you need, when you need it.  Your next appointment:   1 month(s) after your ablation  The format for your next appointment:   In Person  Provider:   AFib clinic   Thank you for choosing [facility]!!   Trinidad Curet, RN [phone]    Other Instructions   Cardiac Ablation Cardiac ablation is a procedure to destroy (ablate) some heart tissue that is sending bad signals. These bad signals cause problems in heart rhythm. The heart has many areas that make these signals. If there are problems in these areas, they can make the heart beat in a way that is not normal. Destroying some tissues can help make the heart rhythm normal. Tell your doctor about: Any allergies you have. All medicines you are taking. These include vitamins, herbs, eye drops, creams, and over-the-counter medicines. Any problems you or family members have had with medicines that make you fall asleep (anesthetics). Any blood disorders you have. Any surgeries you have had. Any medical conditions you have, such as kidney failure. Whether you are pregnant or may be pregnant. What are the risks? This is a safe procedure. But problems may occur, including: Infection. Bruising and bleeding. Bleeding into the chest. Stroke or blood clots. Damage to nearby areas of your body. Allergies to medicines or dyes. The need for a pacemaker if the normal system is damaged. Failure of the procedure to treat the problem. What happens before the procedure? Medicines Ask your doctor about: Changing or stopping  your normal medicines. This is important. Taking aspirin and ibuprofen. Do not take these medicines unless your doctor tells you to take them. Taking other medicines, vitamins, herbs, and supplements. General instructions Follow instructions from your doctor about what you cannot eat or drink. Plan to have someone take you  home from the hospital or clinic. If you will be going home right after the procedure, plan to have someone with you for 24 hours. Ask your doctor what steps will be taken to prevent infection. What happens during the procedure?  An IV tube will be put into one of your veins. You will be given a medicine to help you relax. The skin on your neck or groin will be numbed. A cut (incision) will be made in your neck or groin. A needle will be put through your cut and into a large vein. A tube (catheter) will be put into the needle. The tube will be moved to your heart. Dye may be put through the tube. This helps your doctor see your heart. Small devices (electrodes) on the tube will send out signals. A type of energy will be used to destroy some heart tissue. The tube will be taken out. Pressure will be held on your cut. This helps stop bleeding. A bandage will be put over your cut. The exact procedure may vary among doctors and hospitals. What happens after the procedure? You will be watched until you leave the hospital or clinic. This includes checking your heart rate, breathing rate, oxygen, and blood pressure. Your cut will be watched for bleeding. You will need to lie still for a few hours. Do not drive for 24 hours or as long as your doctor tells you. Summary Cardiac ablation is a procedure to destroy some heart tissue. This is done to treat heart rhythm problems. Tell your doctor about any medical conditions you may have. Tell him or her about all medicines you are taking to treat them. This is a safe procedure. But problems may occur. These include infection, bruising, bleeding, and damage to nearby areas of your body. Follow what your doctor tells you about food and drink. You may also be told to change or stop some of your medicines. After the procedure, do not drive for 24 hours or as long as your doctor tells you. This information is not intended to replace advice given to you by  your health care provider. Make sure you discuss any questions you have with your health care provider. Document Revised: 05/25/2021 Document Reviewed: 02/04/2019 Elsevier Patient Education  [place].

## 2022-05-13 NOTE — Progress Notes (Signed)
Electrophysiology Office Note   Date:  05/13/2022   ID:  Walter George, Walter George September 30, 1945, MRN [id number]  PCP:  Crist Infante, MD  Cardiologist:  Aundra Dubin Primary Electrophysiologist:  Khadejah Son Meredith Leeds, MD    Chief Complaint: AF   History of Present Illness: Walter George is a 77 y.o. male who is being seen today for the evaluation of AF at the request of Larey Dresser, MD. Presenting today for electrophysiology evaluation.  He has a history for hyperlipidemia, elevated coronary artery calcium score of 1654 with a nonischemic Myoview, atrial fibrillation.  He was seen by his PCP November 2023 with atrial fibrillation.  He has now status post cardioversion 03/26/2022.  He has dyspnea when walking up a hill, but this is unchanged from previous.  He plays golf and tennis weekly and walks for exercise.  Today, he denies symptoms of palpitations, chest pain, shortness of breath, orthopnea, PND, lower extremity edema, claudication, dizziness, presyncope, syncope, bleeding, or neurologic sequela. The patient is tolerating medications without difficulties.  He is minimally symptomatic from his atrial fibrillation.  He continues to be quite active.  He has no chest pain, shortness of breath, or fatigue.  Despite this, he would like to get back into normal rhythm and prefer a rhythm control strategy.   Past Medical History:  Diagnosis Date   CHF (congestive heart failure) ([place])    Kidney stones    3 stones, last 20 years ago.   Plantar fasciitis    Past Surgical History:  Procedure Laterality Date   CARDIOVERSION N/A 03/26/2022   Procedure: CARDIOVERSION;  Surgeon: Larey Dresser, MD;  Location: [facility] ENDOSCOPY;  Service: Cardiovascular;  Laterality: N/A;   COLONOSCOPY     kidney stone removal     x2   TONSILLECTOMY  1953     Current Outpatient Medications  Medication Sig Dispense Refill   acetaminophen (TYLENOL) 500 MG tablet Take 500-1,000 mg by mouth every 6 (six) hours  as needed for headache or moderate pain.     apixaban (ELIQUIS) 5 MG TABS tablet Take 1 tablet (5 mg total) by mouth 2 (two) times daily. 180 tablet 1   atorvastatin (LIPITOR) 80 MG tablet Take 1 tablet (80 mg total) by mouth daily. 90 tablet 3   Cholecalciferol-Vitamin C (VITAMIN D3-VITAMIN C PO) Take 1 tablet by mouth daily.     ezetimibe (ZETIA) 10 MG tablet Take 1 tablet (10 mg total) by mouth daily. 90 tablet 3   Multiple Vitamin (MULTIVITAMIN WITH MINERALS) TABS tablet Take 1 tablet by mouth daily.     ramipril (ALTACE) 1.25 MG capsule TAKE 1 CAPSULE(1.25 MG) BY MOUTH DAILY 90 capsule 11   Turmeric (QC TUMERIC COMPLEX) 500 MG CAPS Take 500 mg by mouth daily.     No current facility-administered medications for this visit.    Allergies:   Patient has no known allergies.   Social History:  The patient  reports that he has never smoked. He has never used smokeless tobacco. He reports current alcohol use of about 9.0 standard drinks of alcohol per week. He reports that he does not use drugs.   Family History:  The patient's family history includes Cancer in his father; Dementia in his mother and paternal aunt.    ROS:  Please see the history of present illness.   Otherwise, review of systems is positive for none.   All other systems are reviewed and negative.    PHYSICAL EXAM: VS:  BP 130/84  Pulse 64   Ht 5' 8.23" (1.733 m)   Wt 161 lb (73 kg)   SpO2 99%   BMI 24.32 kg/m  , BMI Body mass index is 24.32 kg/m. GEN: Well nourished, well developed, in no acute distress  HEENT: normal  Neck: no JVD, carotid bruits, or masses Cardiac: irregular; no murmurs, rubs, or gallops,no edema  Respiratory:  clear to auscultation bilaterally, normal work of breathing GI: soft, nontender, nondistended, + BS MS: no deformity or atrophy  Skin: warm and dry Neuro:  Strength and sensation are intact Psych: euthymic mood, full affect  EKG:  EKG is not ordered today. Personal review of the ekg  ordered 04/02/22 shows sinus rhythm  Recent Labs: 02/27/2022: B Natriuretic Peptide 68.6; BUN 21; Creatinine, Ser 1.15; Hemoglobin 15.5; Platelets 213; Potassium 4.0; Sodium 142; TSH 2.441    Lipid Panel     Component Value Date/Time   CHOL 93 02/27/2022 1650   TRIG 31 02/27/2022 1650   HDL 50 02/27/2022 1650   CHOLHDL 1.9 02/27/2022 1650   VLDL 6 02/27/2022 1650   LDLCALC 37 02/27/2022 1650   LDLDIRECT 126.2 02/26/2011 1556     Wt Readings from Last 3 Encounters:  05/13/22 161 lb (73 kg)  03/26/22 160 lb (72.6 kg)  02/27/22 163 lb 3.2 oz (74 kg)      Other studies Reviewed: Additional studies/ records that were reviewed today include: TTE 2018  Review of the above records today demonstrates:  - Left ventricle: The cavity size was normal. There was mild    concentric hypertrophy. Systolic function was normal. The    estimated ejection fraction was in the range of 55% to 60%. Wall    motion was normal; there were no regional wall motion    abnormalities. Left ventricular diastolic function parameters    were normal.  - Mitral valve: Mildly thickened leaflets . There was trivial    regurgitation.  - Left atrium: The atrium was normal in size.  - Right atrium: The atrium was mildly dilated.  - Inferior vena cava: The vessel was normal in size. The    respirophasic diameter changes were in the normal range (>= 50%),    consistent with normal central venous pressure.   Myoview 2020 Excellent exercise capacity (16 METS) There was no ST segment deviation noted during stress. The left ventricular ejection fraction is normal (55-65%). Nuclear stress EF: 61%. The study is normal. This is a low risk study.  ASSESSMENT AND PLAN:  1.  Persistent atrial fibrillation/flutter: CHA2DS2-VASc of 2.  Currently on Eliquis 5 mg twice daily.  Cardioversion 03/26/2022.  He is unfortunate gone back into atrial fibrillation.  He would prefer a rhythm control strategy.  We discussed medication  management versus ablation.  He would prefer to avoid long-term medications.  Due to that, we Taelor Waymire plan for ablation.  Risk and benefits have been discussed.  He understands the risks and is agreed to the procedure.  Risk, benefits, and alternatives to EP study and radiofrequency ablation for afib were also discussed in detail today. These risks include but are not limited to stroke, bleeding, vascular damage, tamponade, perforation, damage to the esophagus, lungs, and other structures, pulmonary vein stenosis, worsening renal function, and death. The patient understands these risk and wishes to proceed.  We Greysen Devino therefore proceed with catheter ablation at the next available time.  Carto, ICE, anesthesia are requested for the procedure.  Maelyn Berrey also obtain CT PV protocol prior to the procedure to  exclude LAA thrombus and further evaluate atrial anatomy.   2.  Secondary hypercoagulable state: Currently on Eliquis for atrial fibrillation/flutter as above  3.  Coronary artery disease: Significantly elevated calcium score without ischemic symptoms.  Plan per primary cardiology.  Case discussed with primary cardiology  Current medicines are reviewed at length with the patient today.   The patient does not have concerns regarding his medicines.  The following changes were made today:  none  Labs/ tests ordered today include:  No orders of the defined types were placed in this encounter.    Disposition:   FU with Fleda Pagel 3 months  Signed, Margret Moat Meredith Leeds, MD  05/13/2022 12:19 PM     [place]  [address] [phone] (office) [phone] (fax)

## 2022-05-20 DIAGNOSIS — H31003 Unspecified chorioretinal scars, bilateral: Secondary | ICD-10-CM | POA: Diagnosis not present

## 2022-05-20 DIAGNOSIS — H43813 Vitreous degeneration, bilateral: Secondary | ICD-10-CM | POA: Diagnosis not present

## 2022-05-20 DIAGNOSIS — Z961 Presence of intraocular lens: Secondary | ICD-10-CM | POA: Diagnosis not present

## 2022-06-03 ENCOUNTER — Encounter (HOSPITAL_COMMUNITY): Payer: Self-pay

## 2022-06-03 ENCOUNTER — Encounter (HOSPITAL_COMMUNITY): Payer: Medicare Other | Admitting: Cardiology

## 2022-06-04 DIAGNOSIS — L821 Other seborrheic keratosis: Secondary | ICD-10-CM | POA: Diagnosis not present

## 2022-06-25 ENCOUNTER — Other Ambulatory Visit (HOSPITAL_COMMUNITY): Payer: Self-pay

## 2022-06-25 MED ORDER — EZETIMIBE 10 MG PO TABS: 10.0000 mg | ORAL_TABLET | Freq: Every day | ORAL | 3 refills | Status: DC

## 2022-07-03 ENCOUNTER — Ambulatory Visit: Payer: Medicare Other | Attending: Cardiology

## 2022-07-03 DIAGNOSIS — I4819 Other persistent atrial fibrillation: Secondary | ICD-10-CM

## 2022-07-04 LAB — CBC
Hematocrit: 47.1 % (ref 37.5–51.0)
Hemoglobin: 15.8 g/dL (ref 13.0–17.7)
MCH: 31.2 pg (ref 26.6–33.0)
MCHC: 33.5 g/dL (ref 31.5–35.7)
MCV: 93 fL (ref 79–97)
Platelets: 296 10*3/uL (ref 150–450)
RBC: 5.06 x10E6/uL (ref 4.14–5.80)
RDW: 12.4 % (ref 11.6–15.4)
WBC: 5.5 10*3/uL (ref 3.4–10.8)

## 2022-07-04 LAB — BASIC METABOLIC PANEL
BUN/Creatinine Ratio: 18 (ref 10–24)
BUN: 20 mg/dL (ref 8–27)
CO2: 21 mmol/L (ref 20–29)
Calcium: 9 mg/dL (ref 8.6–10.2)
Chloride: 105 mmol/L (ref 96–106)
Creatinine, Ser: 1.09 mg/dL (ref 0.76–1.27)
Glucose: 46 mg/dL — ABNORMAL LOW (ref 70–99)
Potassium: 4.5 mmol/L (ref 3.5–5.2)
Sodium: 141 mmol/L (ref 134–144)
eGFR: 71 mL/min/{1.73_m2} (ref >59)

## 2022-07-08 ENCOUNTER — Other Ambulatory Visit (HOSPITAL_COMMUNITY): Payer: Self-pay | Admitting: Family Medicine

## 2022-07-08 ENCOUNTER — Ambulatory Visit (HOSPITAL_BASED_OUTPATIENT_CLINIC_OR_DEPARTMENT_OTHER)
Admission: RE | Admit: 2022-07-08 | Discharge: 2022-07-08 | Disposition: A | Discharge status: Home / Self Care | Payer: Medicare Other | Source: Ambulatory Visit | Attending: Family Medicine | Admitting: Family Medicine

## 2022-07-08 ENCOUNTER — Encounter (HOSPITAL_BASED_OUTPATIENT_CLINIC_OR_DEPARTMENT_OTHER): Payer: Self-pay

## 2022-07-08 DIAGNOSIS — I4891 Unspecified atrial fibrillation: Secondary | ICD-10-CM | POA: Diagnosis not present

## 2022-07-08 DIAGNOSIS — R197 Diarrhea, unspecified: Secondary | ICD-10-CM | POA: Diagnosis not present

## 2022-07-08 DIAGNOSIS — R635 Abnormal weight gain: Secondary | ICD-10-CM | POA: Diagnosis not present

## 2022-07-08 DIAGNOSIS — R634 Abnormal weight loss: Secondary | ICD-10-CM

## 2022-07-08 DIAGNOSIS — R112 Nausea with vomiting, unspecified: Secondary | ICD-10-CM | POA: Diagnosis not present

## 2022-07-08 MED ORDER — IOHEXOL 300 MG/ML  SOLN
100.0000 mL | Freq: Once | INTRAMUSCULAR | Status: AC | PRN
  Administered 2022-07-08: 85 mL via INTRAVENOUS

## 2022-07-09 ENCOUNTER — Telehealth: Payer: Self-pay | Admitting: *Deleted

## 2022-07-09 NOTE — Telephone Encounter (Signed)
Left message to call back to inform of new time to arrive for his procedure on 5/3

## 2022-07-10 DIAGNOSIS — R197 Diarrhea, unspecified: Secondary | ICD-10-CM | POA: Diagnosis not present

## 2022-07-10 NOTE — Telephone Encounter (Signed)
Left message for patient informing him his procedure time on May 3rd has changed. Sent MyChart message regarding this change to patient.  Provided office number for callback if any questions.

## 2022-07-10 NOTE — Telephone Encounter (Signed)
Patient responded to MyChart message confirming new arrival time for procedure on 5/3.

## 2022-07-10 NOTE — Telephone Encounter (Signed)
Pt was returning nurse call and is requesting a callback. Please advise.

## 2022-07-11 ENCOUNTER — Telehealth (HOSPITAL_COMMUNITY): Payer: Self-pay | Admitting: Emergency Medicine

## 2022-07-11 NOTE — Telephone Encounter (Signed)
Attempted to call patient regarding upcoming cardiac CT appointment. °Left message on voicemail with name and callback number °Abdoul Encinas RN Navigator Cardiac Imaging °[facility] °[phone] Office °[phone] Cell ° °

## 2022-07-12 ENCOUNTER — Ambulatory Visit (HOSPITAL_COMMUNITY)
Admission: RE | Admit: 2022-07-12 | Discharge: 2022-07-12 | Disposition: A | Discharge status: Home / Self Care | Payer: Medicare Other | Source: Ambulatory Visit | Attending: Cardiology | Admitting: Cardiology

## 2022-07-12 DIAGNOSIS — I4819 Other persistent atrial fibrillation: Secondary | ICD-10-CM | POA: Insufficient documentation

## 2022-07-12 MED ORDER — IOHEXOL 350 MG/ML SOLN
95.0000 mL | Freq: Once | INTRAVENOUS | Status: AC | PRN
  Administered 2022-07-12: 95 mL via INTRAVENOUS

## 2022-07-18 NOTE — Pre-Procedure Instructions (Signed)
Instructed patient on the following items: Arrival time 0515 Nothing to eat or drink after midnight No meds AM of procedure Responsible person to drive you home and stay with you for 24 hrs  Have you missed any doses of anti-coagulant Eliquis- takes twice a day, hasn't missed any doses.  Don't take dose in the morning.

## 2022-07-19 ENCOUNTER — Ambulatory Visit (HOSPITAL_COMMUNITY): Payer: Medicare Other | Admitting: Anesthesiology

## 2022-07-19 ENCOUNTER — Encounter (HOSPITAL_COMMUNITY)
Admission: RE | Disposition: A | Discharge status: Home / Self Care | Payer: Self-pay | Source: Home / Self Care | Attending: Cardiology

## 2022-07-19 ENCOUNTER — Ambulatory Visit (HOSPITAL_BASED_OUTPATIENT_CLINIC_OR_DEPARTMENT_OTHER)
Admission: RE | Admit: 2022-07-19 | Discharge: 2022-07-19 | Disposition: A | Discharge status: Home / Self Care | Payer: Medicare Other | Source: Home / Self Care | Attending: Cardiology | Admitting: Cardiology

## 2022-07-19 ENCOUNTER — Ambulatory Visit (HOSPITAL_BASED_OUTPATIENT_CLINIC_OR_DEPARTMENT_OTHER): Payer: Medicare Other | Admitting: Anesthesiology

## 2022-07-19 ENCOUNTER — Other Ambulatory Visit: Payer: Self-pay

## 2022-07-19 DIAGNOSIS — I252 Old myocardial infarction: Secondary | ICD-10-CM | POA: Diagnosis not present

## 2022-07-19 DIAGNOSIS — R9431 Abnormal electrocardiogram [ECG] [EKG]: Secondary | ICD-10-CM | POA: Diagnosis not present

## 2022-07-19 DIAGNOSIS — D75839 Thrombocytosis, unspecified: Secondary | ICD-10-CM | POA: Diagnosis not present

## 2022-07-19 DIAGNOSIS — I25110 Atherosclerotic heart disease of native coronary artery with unstable angina pectoris: Secondary | ICD-10-CM | POA: Diagnosis not present

## 2022-07-19 DIAGNOSIS — I480 Paroxysmal atrial fibrillation: Secondary | ICD-10-CM | POA: Diagnosis not present

## 2022-07-19 DIAGNOSIS — R338 Other retention of urine: Secondary | ICD-10-CM | POA: Diagnosis not present

## 2022-07-19 DIAGNOSIS — K5900 Constipation, unspecified: Secondary | ICD-10-CM | POA: Diagnosis not present

## 2022-07-19 DIAGNOSIS — I2510 Atherosclerotic heart disease of native coronary artery without angina pectoris: Secondary | ICD-10-CM | POA: Diagnosis not present

## 2022-07-19 DIAGNOSIS — I4892 Unspecified atrial flutter: Secondary | ICD-10-CM | POA: Insufficient documentation

## 2022-07-19 DIAGNOSIS — E875 Hyperkalemia: Secondary | ICD-10-CM | POA: Diagnosis not present

## 2022-07-19 DIAGNOSIS — E861 Hypovolemia: Secondary | ICD-10-CM | POA: Diagnosis not present

## 2022-07-19 DIAGNOSIS — I4891 Unspecified atrial fibrillation: Secondary | ICD-10-CM

## 2022-07-19 DIAGNOSIS — I499 Cardiac arrhythmia, unspecified: Secondary | ICD-10-CM | POA: Diagnosis not present

## 2022-07-19 DIAGNOSIS — E871 Hypo-osmolality and hyponatremia: Secondary | ICD-10-CM | POA: Diagnosis not present

## 2022-07-19 DIAGNOSIS — I2584 Coronary atherosclerosis due to calcified coronary lesion: Secondary | ICD-10-CM | POA: Diagnosis not present

## 2022-07-19 DIAGNOSIS — R0789 Other chest pain: Secondary | ICD-10-CM | POA: Diagnosis not present

## 2022-07-19 DIAGNOSIS — I5031 Acute diastolic (congestive) heart failure: Secondary | ICD-10-CM | POA: Diagnosis not present

## 2022-07-19 DIAGNOSIS — J939 Pneumothorax, unspecified: Secondary | ICD-10-CM | POA: Diagnosis not present

## 2022-07-19 DIAGNOSIS — Z1152 Encounter for screening for COVID-19: Secondary | ICD-10-CM | POA: Diagnosis not present

## 2022-07-19 DIAGNOSIS — I483 Typical atrial flutter: Secondary | ICD-10-CM | POA: Diagnosis not present

## 2022-07-19 DIAGNOSIS — R079 Chest pain, unspecified: Secondary | ICD-10-CM | POA: Diagnosis not present

## 2022-07-19 DIAGNOSIS — J9811 Atelectasis: Secondary | ICD-10-CM | POA: Diagnosis not present

## 2022-07-19 DIAGNOSIS — J95811 Postprocedural pneumothorax: Secondary | ICD-10-CM | POA: Diagnosis not present

## 2022-07-19 DIAGNOSIS — I700 Atherosclerosis of aorta: Secondary | ICD-10-CM | POA: Diagnosis not present

## 2022-07-19 DIAGNOSIS — I213 ST elevation (STEMI) myocardial infarction of unspecified site: Secondary | ICD-10-CM | POA: Diagnosis not present

## 2022-07-19 DIAGNOSIS — I214 Non-ST elevation (NSTEMI) myocardial infarction: Secondary | ICD-10-CM | POA: Diagnosis not present

## 2022-07-19 DIAGNOSIS — I4819 Other persistent atrial fibrillation: Secondary | ICD-10-CM | POA: Insufficient documentation

## 2022-07-19 DIAGNOSIS — J90 Pleural effusion, not elsewhere classified: Secondary | ICD-10-CM | POA: Diagnosis not present

## 2022-07-19 DIAGNOSIS — D62 Acute posthemorrhagic anemia: Secondary | ICD-10-CM | POA: Diagnosis not present

## 2022-07-19 DIAGNOSIS — E785 Hyperlipidemia, unspecified: Secondary | ICD-10-CM | POA: Diagnosis not present

## 2022-07-19 DIAGNOSIS — I219 Acute myocardial infarction, unspecified: Secondary | ICD-10-CM | POA: Diagnosis not present

## 2022-07-19 DIAGNOSIS — N401 Enlarged prostate with lower urinary tract symptoms: Secondary | ICD-10-CM | POA: Diagnosis not present

## 2022-07-19 DIAGNOSIS — R001 Bradycardia, unspecified: Secondary | ICD-10-CM | POA: Diagnosis not present

## 2022-07-19 DIAGNOSIS — I509 Heart failure, unspecified: Secondary | ICD-10-CM | POA: Diagnosis not present

## 2022-07-19 DIAGNOSIS — Z01810 Encounter for preprocedural cardiovascular examination: Secondary | ICD-10-CM | POA: Diagnosis not present

## 2022-07-19 DIAGNOSIS — I340 Nonrheumatic mitral (valve) insufficiency: Secondary | ICD-10-CM | POA: Diagnosis not present

## 2022-07-19 DIAGNOSIS — I25119 Atherosclerotic heart disease of native coronary artery with unspecified angina pectoris: Secondary | ICD-10-CM | POA: Diagnosis not present

## 2022-07-19 DIAGNOSIS — N990 Postprocedural (acute) (chronic) kidney failure: Secondary | ICD-10-CM | POA: Diagnosis not present

## 2022-07-19 DIAGNOSIS — I200 Unstable angina: Secondary | ICD-10-CM | POA: Diagnosis not present

## 2022-07-19 DIAGNOSIS — R7989 Other specified abnormal findings of blood chemistry: Secondary | ICD-10-CM | POA: Diagnosis not present

## 2022-07-19 DIAGNOSIS — I959 Hypotension, unspecified: Secondary | ICD-10-CM | POA: Diagnosis not present

## 2022-07-19 HISTORY — PX: ATRIAL FIBRILLATION ABLATION: EP1191

## 2022-07-19 LAB — POCT ACTIVATED CLOTTING TIME: Activated Clotting Time: 271 seconds

## 2022-07-19 SURGERY — ATRIAL FIBRILLATION ABLATION
Anesthesia: General

## 2022-07-19 MED ORDER — HEPARIN SODIUM (PORCINE) 1000 UNIT/ML IJ SOLN
INTRAMUSCULAR | Status: DC | PRN
  Administered 2022-07-19: 1000 [IU] via INTRAVENOUS

## 2022-07-19 MED ORDER — SODIUM CHLORIDE 0.9% FLUSH: 3.0000 mL | INTRAVENOUS | Status: DC | PRN

## 2022-07-19 MED ORDER — SUGAMMADEX SODIUM 200 MG/2ML IV SOLN
INTRAVENOUS | Status: DC | PRN
  Administered 2022-07-19: 200 mg via INTRAVENOUS

## 2022-07-19 MED ORDER — SODIUM CHLORIDE 0.9 % IV SOLN: INTRAVENOUS | Status: DC

## 2022-07-19 MED ORDER — LIDOCAINE 2% (20 MG/ML) 5 ML SYRINGE
INTRAMUSCULAR | Status: DC | PRN
  Administered 2022-07-19: 100 mg via INTRAVENOUS

## 2022-07-19 MED ORDER — ROCURONIUM BROMIDE 10 MG/ML (PF) SYRINGE
PREFILLED_SYRINGE | INTRAVENOUS | Status: DC | PRN
  Administered 2022-07-19: 60 mg via INTRAVENOUS

## 2022-07-19 MED ORDER — HEPARIN SODIUM (PORCINE) 1000 UNIT/ML IJ SOLN
INTRAMUSCULAR | Status: DC | PRN
  Administered 2022-07-19: 6000 [IU] via INTRAVENOUS
  Administered 2022-07-19: 14000 [IU] via INTRAVENOUS

## 2022-07-19 MED ORDER — DEXAMETHASONE SODIUM PHOSPHATE 10 MG/ML IJ SOLN
INTRAMUSCULAR | Status: DC | PRN
  Administered 2022-07-19: 5 mg via INTRAVENOUS

## 2022-07-19 MED ORDER — PROPOFOL 10 MG/ML IV BOLUS
INTRAVENOUS | Status: DC | PRN
  Administered 2022-07-19: 120 mg via INTRAVENOUS

## 2022-07-19 MED ORDER — ONDANSETRON HCL 4 MG/2ML IJ SOLN: 4.0000 mg | Freq: Four times a day (QID) | INTRAMUSCULAR | Status: DC | PRN

## 2022-07-19 MED ORDER — PHENYLEPHRINE HCL-NACL 20-0.9 MG/250ML-% IV SOLN
INTRAVENOUS | Status: DC | PRN
  Administered 2022-07-19: 30 ug/min via INTRAVENOUS

## 2022-07-19 MED ORDER — ONDANSETRON HCL 4 MG/2ML IJ SOLN
INTRAMUSCULAR | Status: DC | PRN
  Administered 2022-07-19: 4 mg via INTRAVENOUS

## 2022-07-19 MED ORDER — DOBUTAMINE INFUSION FOR EP/ECHO/NUC (1000 MCG/ML)
INTRAVENOUS | Status: AC
  Filled 2022-07-19: qty 250

## 2022-07-19 MED ORDER — HEPARIN (PORCINE) IN NACL 1000-0.9 UT/500ML-% IV SOLN
INTRAVENOUS | Status: DC | PRN
  Administered 2022-07-19 (×4): 500 mL

## 2022-07-19 MED ORDER — DOBUTAMINE INFUSION FOR EP/ECHO/NUC (1000 MCG/ML)
INTRAVENOUS | Status: DC | PRN
  Administered 2022-07-19: 20 ug/kg/min via INTRAVENOUS

## 2022-07-19 MED ORDER — ACETAMINOPHEN 325 MG PO TABS: 650.0000 mg | ORAL_TABLET | ORAL | Status: DC | PRN

## 2022-07-19 MED ORDER — SODIUM CHLORIDE 0.9% FLUSH: 3.0000 mL | Freq: Two times a day (BID) | INTRAVENOUS | Status: DC

## 2022-07-19 MED ORDER — HEPARIN SODIUM (PORCINE) 1000 UNIT/ML IJ SOLN
INTRAMUSCULAR | Status: AC
  Filled 2022-07-19: qty 10

## 2022-07-19 MED ORDER — SODIUM CHLORIDE 0.9 % IV SOLN: 250.0000 mL | INTRAVENOUS | Status: DC | PRN

## 2022-07-19 MED ORDER — FENTANYL CITRATE (PF) 100 MCG/2ML IJ SOLN
INTRAMUSCULAR | Status: DC | PRN
  Administered 2022-07-19: 50 ug via INTRAVENOUS

## 2022-07-19 MED ORDER — PROTAMINE SULFATE 10 MG/ML IV SOLN
INTRAVENOUS | Status: DC | PRN
  Administered 2022-07-19: 40 mg via INTRAVENOUS

## 2022-07-19 SURGICAL SUPPLY — 20 items
BAG SNAP BAND KOVER 36X36 (MISCELLANEOUS) IMPLANT
CATH 8FR REPROCESSED SOUNDSTAR (CATHETERS) ×1 IMPLANT
CATH 8FR SOUNDSTAR REPROCESSED (CATHETERS) IMPLANT
CATH ABLAT QDOT MICRO BI TC DF (CATHETERS) IMPLANT
CATH OCTARAY 2.0 F 3-3-3-3-3 (CATHETERS) IMPLANT
CATH PIGTAIL STEERABLE D1 8.7 (WIRE) IMPLANT
CATH S-M CIRCA TEMP PROBE (CATHETERS) IMPLANT
CATH WEB BI DIR CSDF CRV REPRO (CATHETERS) IMPLANT
CLOSURE PERCLOSE PROSTYLE (VASCULAR PRODUCTS) IMPLANT
COVER SWIFTLINK CONNECTOR (BAG) ×1 IMPLANT
PACK EP LATEX FREE (CUSTOM PROCEDURE TRAY) ×1
PACK EP LF (CUSTOM PROCEDURE TRAY) ×1 IMPLANT
PAD DEFIB RADIO PHYSIO CONN (PAD) ×1 IMPLANT
PATCH CARTO3 (PAD) IMPLANT
SHEATH CARTO VIZIGO SM CVD (SHEATH) IMPLANT
SHEATH PINNACLE 7F 10CM (SHEATH) IMPLANT
SHEATH PINNACLE 8F 10CM (SHEATH) IMPLANT
SHEATH PINNACLE 9F 10CM (SHEATH) IMPLANT
SHEATH PROBE COVER 6X72 (BAG) IMPLANT
TUBING SMART ABLATE COOLFLOW (TUBING) IMPLANT

## 2022-07-19 NOTE — Anesthesia Procedure Notes (Signed)
Procedure Name: Intubation Date/Time: 07/19/2022 7:47 AM  Performed by: Samara Deist, CRNAPre-anesthesia Checklist: Patient identified, Emergency Drugs available, Suction available, Patient being monitored and Timeout performed Patient Re-evaluated:Patient Re-evaluated prior to induction Oxygen Delivery Method: Circle system utilized Preoxygenation: Pre-oxygenation with 100% oxygen Induction Type: IV induction Ventilation: Mask ventilation without difficulty Laryngoscope Size: Mac and 4 Grade View: Grade II Tube type: Oral Tube size: 7.5 mm Number of attempts: 1 Airway Equipment and Method: Stylet Placement Confirmation: ETT inserted through vocal cords under direct vision, positive ETCO2 and breath sounds checked- equal and bilateral Secured at: 22 cm Tube secured with: Tape Dental Injury: Teeth and Oropharynx as per pre-operative assessment

## 2022-07-19 NOTE — H&P (Signed)
Electrophysiology Office Note   Date:  07/19/2022   ID:  JKWON TESSMANN, DOB 1945/10/31, MRN [id number]  PCP:  Rodrigo Ran, MD  Cardiologist:  Shirlee Latch Primary Electrophysiologist:  Alleta Avery Jorja Loa, MD    Chief Complaint: AF   History of Present Illness: Walter George is a 77 y.o. male who is being seen today for the evaluation of AF at the request of No ref. provider found. Presenting today for electrophysiology evaluation.  He has a history for hyperlipidemia, elevated coronary artery calcium score of 1654 with a nonischemic Myoview, atrial fibrillation.  He was seen by his PCP November 2023 with atrial fibrillation.  He has now status post cardioversion 03/26/2022.  He has dyspnea when walking up a hill, but this is unchanged from previous.  He plays golf and tennis weekly and walks for exercise.  Today, denies symptoms of palpitations, chest pain, shortness of breath, orthopnea, PND, lower extremity edema, claudication, dizziness, presyncope, syncope, bleeding, or neurologic sequela. The patient is tolerating medications without difficulties. Plan ablation today.    Past Medical History:  Diagnosis Date   CHF (congestive heart failure) (HCC)    Kidney stones    3 stones, last 20 years ago.   Plantar fasciitis    Past Surgical History:  Procedure Laterality Date   CARDIOVERSION N/A 03/26/2022   Procedure: CARDIOVERSION;  Surgeon: [name], MD;  Location: [facility] ENDOSCOPY;  Service: Cardiovascular;  Laterality: N/A;   COLONOSCOPY     kidney stone removal     x2   TONSILLECTOMY  1953     Current Facility-Administered Medications  Medication Dose Route Frequency Provider Last Rate Last Admin   0.9 %  sodium chloride infusion   Intravenous Continuous Regan Lemming, MD 50 mL/hr at 07/19/22 0615 New Bag at 07/19/22 0615    Allergies:   Amoxicillin   Social History:  The patient  reports that he has never smoked. He has never used smokeless tobacco. He  reports current alcohol use of about 9.0 standard drinks of alcohol per week. He reports that he does not use drugs.   Family History:  The patient's family history includes Cancer in his father; Dementia in his mother and paternal aunt.   ROS:  Please see the history of present illness.   Otherwise, review of systems is positive for none.   All other systems are reviewed and negative.   PHYSICAL EXAM: VS:  BP 103/73   Pulse (!) 49   Temp 98.4 F (36.9 C) (Temporal)   Resp 18   Ht 5' 9.5" (1.765 m)   Wt 68 kg   SpO2 98%   BMI 21.83 kg/m  , BMI Body mass index is 21.83 kg/m. GEN: Well nourished, well developed, in no acute distress  HEENT: normal  Neck: no JVD, carotid bruits, or masses Cardiac: RRR; no murmurs, rubs, or gallops,no edema  Respiratory:  clear to auscultation bilaterally, normal work of breathing GI: soft, nontender, nondistended, + BS MS: no deformity or atrophy  Skin: warm and dry Neuro:  Strength and sensation are intact Psych: euthymic mood, full affect   Recent Labs: 02/27/2022: B Natriuretic Peptide 68.6; TSH 2.441 07/03/2022: BUN 20; Creatinine, Ser 1.09; Hemoglobin 15.8; Platelets 296; Potassium 4.5; Sodium 141    Lipid Panel     Component Value Date/Time   CHOL 93 02/27/2022 1650   TRIG 31 02/27/2022 1650   HDL 50 02/27/2022 1650   CHOLHDL 1.9 02/27/2022 1650   VLDL 6  02/27/2022 1650   LDLCALC 37 02/27/2022 1650   LDLDIRECT 126.2 02/26/2011 1556     Wt Readings from Last 3 Encounters:  07/19/22 68 kg  05/13/22 73 kg  03/26/22 72.6 kg      Other studies Reviewed: Additional studies/ records that were reviewed today include: TTE 2018  Review of the above records today demonstrates:  - Left ventricle: The cavity size was normal. There was mild    concentric hypertrophy. Systolic function was normal. The    estimated ejection fraction was in the range of 55% to 60%. Wall    motion was normal; there were no regional wall motion     abnormalities. Left ventricular diastolic function parameters    were normal.  - Mitral valve: Mildly thickened leaflets . There was trivial    regurgitation.  - Left atrium: The atrium was normal in size.  - Right atrium: The atrium was mildly dilated.  - Inferior vena cava: The vessel was normal in size. The    respirophasic diameter changes were in the normal range (>= 50%),    consistent with normal central venous pressure.   Myoview 2020 Excellent exercise capacity (16 METS) There was no ST segment deviation noted during stress. The left ventricular ejection fraction is normal (55-65%). Nuclear stress EF: 61%. The study is normal. This is a low risk study.  ASSESSMENT AND PLAN:  1.  Persistent atrial fibrillation/flutter: Walter George has presented today for surgery, with the diagnosis of AF.  The various methods of treatment have been discussed with the patient and family. After consideration of risks, benefits and other options for treatment, the patient has consented to  Procedure(s): Catheter ablation as a surgical intervention .  Risks include but not limited to complete heart block, stroke, esophageal damage, nerve damage, bleeding, vascular damage, tamponade, perforation, MI, and death. The patient's history has been reviewed, patient examined, no change in status, stable for surgery.  I have reviewed the patient's chart and labs.  Questions were answered to the patient's satisfaction.    Chayne Baumgart Elberta Fortis, MD 07/19/2022 7:02 AM

## 2022-07-19 NOTE — Discharge Instructions (Signed)

## 2022-07-19 NOTE — Anesthesia Postprocedure Evaluation (Signed)
Anesthesia Post Note  Patient: Walter George  Procedure(s) Performed: ATRIAL FIBRILLATION ABLATION     Patient location during evaluation: PACU Anesthesia Type: General Level of consciousness: awake and alert Pain management: pain level controlled Vital Signs Assessment: post-procedure vital signs reviewed and stable Respiratory status: spontaneous breathing, nonlabored ventilation, respiratory function stable and patient connected to nasal cannula oxygen Cardiovascular status: blood pressure returned to baseline and stable Postop Assessment: no apparent nausea or vomiting Anesthetic complications: no  There were no known notable events for this encounter.  Last Vitals:  Vitals:   07/19/22 1000 07/19/22 1015  BP: 101/60 (!) 99/50  Pulse: (!) 56 (!) 57  Resp: 17 15  Temp:    SpO2: 97% 95%    Last Pain:  Vitals:   07/19/22 0957  TempSrc:   PainSc: 0-No pain                 Keyna Blizard S

## 2022-07-19 NOTE — Transfer of Care (Signed)
Immediate Anesthesia Transfer of Care Note  Patient: Walter George  Procedure(s) Performed: ATRIAL FIBRILLATION ABLATION  Patient Location: PACU  Anesthesia Type:General  Level of Consciousness: awake, alert , and oriented  Airway & Oxygen Therapy: Patient Spontanous Breathing and Patient connected to nasal cannula oxygen  Post-op Assessment: Report given to RN and Post -op Vital signs reviewed and stable  Post vital signs: Reviewed and stable  Last Vitals:  Vitals Value Taken Time  BP 111/73 07/19/22 0915  Temp    Pulse 58 07/19/22 0918  Resp 9 07/19/22 0918  SpO2 100 % 07/19/22 0918  Vitals shown include unvalidated device data.  Last Pain:  Vitals:   07/19/22 0631  TempSrc:   PainSc: 0-No pain         Complications: There were no known notable events for this encounter.

## 2022-07-19 NOTE — Anesthesia Preprocedure Evaluation (Signed)
Anesthesia Evaluation  Patient identified by MRN, date of birth, ID band Patient awake    Reviewed: Allergy & Precautions, H&P , NPO status , Patient's Chart, lab work & pertinent test results  Airway Mallampati: II  TM Distance: >3 FB Neck ROM: Full    Dental no notable dental hx.    Pulmonary neg pulmonary ROS   Pulmonary exam normal breath sounds clear to auscultation       Cardiovascular Normal cardiovascular exam+ dysrhythmias Atrial Fibrillation  Rhythm:Irregular Rate:Normal     Neuro/Psych negative neurological ROS  negative psych ROS   GI/Hepatic negative GI ROS, Neg liver ROS,,,  Endo/Other  negative endocrine ROS    Renal/GU negative Renal ROS  negative genitourinary   Musculoskeletal negative musculoskeletal ROS (+)    Abdominal   Peds negative pediatric ROS (+)  Hematology negative hematology ROS (+)   Anesthesia Other Findings   Reproductive/Obstetrics negative OB ROS                             Anesthesia Physical Anesthesia Plan  ASA: 3  Anesthesia Plan: General   Post-op Pain Management: Minimal or no pain anticipated   Induction: Intravenous  PONV Risk Score and Plan: 2 and Ondansetron, Dexamethasone and Treatment may vary due to age or medical condition  Airway Management Planned: Oral ETT  Additional Equipment:   Intra-op Plan:   Post-operative Plan: Extubation in OR  Informed Consent: I have reviewed the patients History and Physical, chart, labs and discussed the procedure including the risks, benefits and alternatives for the proposed anesthesia with the patient or authorized representative who has indicated his/her understanding and acceptance.     Dental advisory given  Plan Discussed with: CRNA and Surgeon  Anesthesia Plan Comments:        Anesthesia Quick Evaluation  

## 2022-07-19 NOTE — Progress Notes (Signed)
Pt ambulated to and from bathroom to void with no signs of oozing from bilateral groin sites  

## 2022-07-20 ENCOUNTER — Inpatient Hospital Stay (HOSPITAL_COMMUNITY)
Admission: EM | Admit: 2022-07-20 | Discharge: 2022-08-01 | DRG: 233 | Disposition: A | Discharge status: Home / Self Care | Payer: Medicare Other | Attending: Thoracic Surgery (Cardiothoracic Vascular Surgery) | Admitting: Thoracic Surgery (Cardiothoracic Vascular Surgery)

## 2022-07-20 ENCOUNTER — Encounter (HOSPITAL_COMMUNITY)
Admission: EM | Disposition: A | Discharge status: Home / Self Care | Payer: Self-pay | Source: Home / Self Care | Attending: Thoracic Surgery (Cardiothoracic Vascular Surgery)

## 2022-07-20 ENCOUNTER — Other Ambulatory Visit: Payer: Self-pay

## 2022-07-20 DIAGNOSIS — I700 Atherosclerosis of aorta: Secondary | ICD-10-CM | POA: Diagnosis present

## 2022-07-20 DIAGNOSIS — I4819 Other persistent atrial fibrillation: Secondary | ICD-10-CM | POA: Diagnosis present

## 2022-07-20 DIAGNOSIS — I959 Hypotension, unspecified: Secondary | ICD-10-CM | POA: Diagnosis not present

## 2022-07-20 DIAGNOSIS — Z951 Presence of aortocoronary bypass graft: Secondary | ICD-10-CM

## 2022-07-20 DIAGNOSIS — R0602 Shortness of breath: Principal | ICD-10-CM

## 2022-07-20 DIAGNOSIS — D62 Acute posthemorrhagic anemia: Secondary | ICD-10-CM | POA: Diagnosis not present

## 2022-07-20 DIAGNOSIS — Z1152 Encounter for screening for COVID-19: Secondary | ICD-10-CM

## 2022-07-20 DIAGNOSIS — I214 Non-ST elevation (NSTEMI) myocardial infarction: Principal | ICD-10-CM | POA: Diagnosis present

## 2022-07-20 DIAGNOSIS — Z7901 Long term (current) use of anticoagulants: Secondary | ICD-10-CM

## 2022-07-20 DIAGNOSIS — N401 Enlarged prostate with lower urinary tract symptoms: Secondary | ICD-10-CM | POA: Diagnosis not present

## 2022-07-20 DIAGNOSIS — E871 Hypo-osmolality and hyponatremia: Secondary | ICD-10-CM | POA: Diagnosis present

## 2022-07-20 DIAGNOSIS — M546 Pain in thoracic spine: Secondary | ICD-10-CM

## 2022-07-20 DIAGNOSIS — J9811 Atelectasis: Secondary | ICD-10-CM | POA: Diagnosis not present

## 2022-07-20 DIAGNOSIS — J95811 Postprocedural pneumothorax: Secondary | ICD-10-CM | POA: Diagnosis not present

## 2022-07-20 DIAGNOSIS — Z87442 Personal history of urinary calculi: Secondary | ICD-10-CM

## 2022-07-20 DIAGNOSIS — K5900 Constipation, unspecified: Secondary | ICD-10-CM | POA: Diagnosis not present

## 2022-07-20 DIAGNOSIS — I25110 Atherosclerotic heart disease of native coronary artery with unstable angina pectoris: Secondary | ICD-10-CM | POA: Diagnosis present

## 2022-07-20 DIAGNOSIS — Z818 Family history of other mental and behavioral disorders: Secondary | ICD-10-CM

## 2022-07-20 DIAGNOSIS — I5031 Acute diastolic (congestive) heart failure: Secondary | ICD-10-CM | POA: Diagnosis not present

## 2022-07-20 DIAGNOSIS — E861 Hypovolemia: Secondary | ICD-10-CM | POA: Diagnosis not present

## 2022-07-20 DIAGNOSIS — D75839 Thrombocytosis, unspecified: Secondary | ICD-10-CM | POA: Diagnosis not present

## 2022-07-20 DIAGNOSIS — Z79899 Other long term (current) drug therapy: Secondary | ICD-10-CM

## 2022-07-20 DIAGNOSIS — R001 Bradycardia, unspecified: Secondary | ICD-10-CM | POA: Diagnosis not present

## 2022-07-20 DIAGNOSIS — N990 Postprocedural (acute) (chronic) kidney failure: Secondary | ICD-10-CM | POA: Diagnosis not present

## 2022-07-20 DIAGNOSIS — E785 Hyperlipidemia, unspecified: Secondary | ICD-10-CM | POA: Diagnosis present

## 2022-07-20 DIAGNOSIS — I2584 Coronary atherosclerosis due to calcified coronary lesion: Secondary | ICD-10-CM | POA: Diagnosis present

## 2022-07-20 DIAGNOSIS — I200 Unstable angina: Secondary | ICD-10-CM | POA: Diagnosis present

## 2022-07-20 DIAGNOSIS — I483 Typical atrial flutter: Secondary | ICD-10-CM | POA: Diagnosis present

## 2022-07-20 DIAGNOSIS — E875 Hyperkalemia: Secondary | ICD-10-CM | POA: Diagnosis not present

## 2022-07-20 DIAGNOSIS — Z880 Allergy status to penicillin: Secondary | ICD-10-CM

## 2022-07-20 DIAGNOSIS — R338 Other retention of urine: Secondary | ICD-10-CM | POA: Diagnosis not present

## 2022-07-20 DIAGNOSIS — I480 Paroxysmal atrial fibrillation: Secondary | ICD-10-CM | POA: Diagnosis present

## 2022-07-20 HISTORY — DX: Unspecified atrial fibrillation: I48.91

## 2022-07-20 HISTORY — PX: CORONARY PRESSURE/FFR STUDY: CATH118243

## 2022-07-20 HISTORY — PX: LEFT HEART CATH AND CORONARY ANGIOGRAPHY: CATH118249

## 2022-07-20 LAB — I-STAT VENOUS BLOOD GAS, ED
Acid-Base Excess: 0 mmol/L (ref 0.0–2.0)
Bicarbonate: 23.6 mmol/L (ref 20.0–28.0)
Calcium, Ion: 1.11 mmol/L — ABNORMAL LOW (ref 1.15–1.40)
HCT: 38 % — ABNORMAL LOW (ref 39.0–52.0)
Hemoglobin: 12.9 g/dL — ABNORMAL LOW (ref 13.0–17.0)
O2 Saturation: 75 %
Potassium: 4.3 mmol/L (ref 3.5–5.1)
Sodium: 137 mmol/L (ref 135–145)
TCO2: 25 mmol/L (ref 22–32)
pCO2, Ven: 33.2 mmHg — ABNORMAL LOW (ref 44–60)
pH, Ven: 7.46 — ABNORMAL HIGH (ref 7.25–7.43)
pO2, Ven: 37 mmHg (ref 32–45)

## 2022-07-20 LAB — I-STAT CHEM 8, ED
BUN: 19 mg/dL (ref 8–23)
Calcium, Ion: 1.14 mmol/L — ABNORMAL LOW (ref 1.15–1.40)
Chloride: 106 mmol/L (ref 98–111)
Creatinine, Ser: 1.1 mg/dL (ref 0.61–1.24)
Glucose, Bld: 127 mg/dL — ABNORMAL HIGH (ref 70–99)
HCT: 38 % — ABNORMAL LOW (ref 39.0–52.0)
Hemoglobin: 12.9 g/dL — ABNORMAL LOW (ref 13.0–17.0)
Potassium: 4.1 mmol/L (ref 3.5–5.1)
Sodium: 138 mmol/L (ref 135–145)
TCO2: 22 mmol/L (ref 22–32)

## 2022-07-20 SURGERY — INTRAVASCULAR PRESSURE WIRE/FFR STUDY
Anesthesia: LOCAL

## 2022-07-20 SURGICAL SUPPLY — 12 items
CATH 5FR JL3.5 JR4 ANG PIG MP (CATHETERS) IMPLANT
CATH VISTA GUIDE 6FR XBLAD3.5 (CATHETERS) IMPLANT
DEVICE RAD COMP TR BAND LRG (VASCULAR PRODUCTS) IMPLANT
GLIDESHEATH SLEND SS 6F .021 (SHEATH) IMPLANT
GUIDEWIRE INQWIRE 1.5J.035X260 (WIRE) IMPLANT
GUIDEWIRE PRESSURE X 175 (WIRE) IMPLANT
INQWIRE 1.5J .035X260CM (WIRE) ×1
KIT ENCORE 26 ADVANTAGE (KITS) IMPLANT
KIT HEART LEFT (KITS) ×1 IMPLANT
PACK CARDIAC CATHETERIZATION (CUSTOM PROCEDURE TRAY) ×1 IMPLANT
TRANSDUCER W/STOPCOCK (MISCELLANEOUS) ×1 IMPLANT
TUBING CIL FLEX 10 FLL-RA (TUBING) ×1 IMPLANT

## 2022-07-20 NOTE — ED Triage Notes (Addendum)
Pt arrives EMS from home with reports of chest pain that began at 2100 tonight. Pt had cardiac ablation on Friday for afib. Pt reports chest pain began when sitting in char and increases with inspiration. Pt took 325 ASA PTA. EMS paged code STEMI

## 2022-07-21 ENCOUNTER — Encounter (HOSPITAL_COMMUNITY): Payer: Self-pay | Admitting: Cardiovascular Disease

## 2022-07-21 ENCOUNTER — Inpatient Hospital Stay (HOSPITAL_COMMUNITY): Payer: Medicare Other

## 2022-07-21 ENCOUNTER — Other Ambulatory Visit (HOSPITAL_COMMUNITY): Payer: Medicare Other

## 2022-07-21 DIAGNOSIS — I2510 Atherosclerotic heart disease of native coronary artery without angina pectoris: Secondary | ICD-10-CM

## 2022-07-21 DIAGNOSIS — I219 Acute myocardial infarction, unspecified: Secondary | ICD-10-CM

## 2022-07-21 DIAGNOSIS — R079 Chest pain, unspecified: Secondary | ICD-10-CM | POA: Diagnosis present

## 2022-07-21 DIAGNOSIS — D62 Acute posthemorrhagic anemia: Secondary | ICD-10-CM | POA: Diagnosis not present

## 2022-07-21 DIAGNOSIS — R001 Bradycardia, unspecified: Secondary | ICD-10-CM | POA: Diagnosis not present

## 2022-07-21 DIAGNOSIS — I480 Paroxysmal atrial fibrillation: Secondary | ICD-10-CM | POA: Diagnosis present

## 2022-07-21 DIAGNOSIS — I25110 Atherosclerotic heart disease of native coronary artery with unstable angina pectoris: Secondary | ICD-10-CM | POA: Diagnosis present

## 2022-07-21 DIAGNOSIS — I2584 Coronary atherosclerosis due to calcified coronary lesion: Secondary | ICD-10-CM | POA: Diagnosis present

## 2022-07-21 DIAGNOSIS — I200 Unstable angina: Secondary | ICD-10-CM | POA: Diagnosis not present

## 2022-07-21 DIAGNOSIS — I4891 Unspecified atrial fibrillation: Secondary | ICD-10-CM | POA: Diagnosis not present

## 2022-07-21 DIAGNOSIS — K5900 Constipation, unspecified: Secondary | ICD-10-CM | POA: Diagnosis not present

## 2022-07-21 DIAGNOSIS — R338 Other retention of urine: Secondary | ICD-10-CM | POA: Diagnosis not present

## 2022-07-21 DIAGNOSIS — Z1152 Encounter for screening for COVID-19: Secondary | ICD-10-CM | POA: Diagnosis not present

## 2022-07-21 DIAGNOSIS — E875 Hyperkalemia: Secondary | ICD-10-CM | POA: Diagnosis not present

## 2022-07-21 DIAGNOSIS — R9431 Abnormal electrocardiogram [ECG] [EKG]: Secondary | ICD-10-CM

## 2022-07-21 DIAGNOSIS — E861 Hypovolemia: Secondary | ICD-10-CM | POA: Diagnosis not present

## 2022-07-21 DIAGNOSIS — I4819 Other persistent atrial fibrillation: Secondary | ICD-10-CM | POA: Diagnosis present

## 2022-07-21 DIAGNOSIS — I959 Hypotension, unspecified: Secondary | ICD-10-CM | POA: Diagnosis not present

## 2022-07-21 DIAGNOSIS — I483 Typical atrial flutter: Secondary | ICD-10-CM | POA: Diagnosis present

## 2022-07-21 DIAGNOSIS — R7989 Other specified abnormal findings of blood chemistry: Secondary | ICD-10-CM | POA: Diagnosis not present

## 2022-07-21 DIAGNOSIS — E871 Hypo-osmolality and hyponatremia: Secondary | ICD-10-CM | POA: Diagnosis present

## 2022-07-21 DIAGNOSIS — I214 Non-ST elevation (NSTEMI) myocardial infarction: Secondary | ICD-10-CM | POA: Diagnosis present

## 2022-07-21 DIAGNOSIS — I25119 Atherosclerotic heart disease of native coronary artery with unspecified angina pectoris: Secondary | ICD-10-CM | POA: Diagnosis not present

## 2022-07-21 DIAGNOSIS — D75839 Thrombocytosis, unspecified: Secondary | ICD-10-CM | POA: Diagnosis not present

## 2022-07-21 DIAGNOSIS — J95811 Postprocedural pneumothorax: Secondary | ICD-10-CM | POA: Diagnosis not present

## 2022-07-21 DIAGNOSIS — I509 Heart failure, unspecified: Secondary | ICD-10-CM | POA: Diagnosis not present

## 2022-07-21 DIAGNOSIS — N990 Postprocedural (acute) (chronic) kidney failure: Secondary | ICD-10-CM | POA: Diagnosis not present

## 2022-07-21 DIAGNOSIS — J9811 Atelectasis: Secondary | ICD-10-CM | POA: Diagnosis not present

## 2022-07-21 DIAGNOSIS — I700 Atherosclerosis of aorta: Secondary | ICD-10-CM | POA: Diagnosis present

## 2022-07-21 DIAGNOSIS — I5031 Acute diastolic (congestive) heart failure: Secondary | ICD-10-CM | POA: Diagnosis not present

## 2022-07-21 DIAGNOSIS — Z01810 Encounter for preprocedural cardiovascular examination: Secondary | ICD-10-CM | POA: Diagnosis not present

## 2022-07-21 DIAGNOSIS — I252 Old myocardial infarction: Secondary | ICD-10-CM | POA: Diagnosis not present

## 2022-07-21 DIAGNOSIS — E785 Hyperlipidemia, unspecified: Secondary | ICD-10-CM | POA: Diagnosis present

## 2022-07-21 DIAGNOSIS — N401 Enlarged prostate with lower urinary tract symptoms: Secondary | ICD-10-CM | POA: Diagnosis not present

## 2022-07-21 LAB — BASIC METABOLIC PANEL
Anion gap: 11 (ref 5–15)
BUN: 16 mg/dL (ref 8–23)
CO2: 21 mmol/L — ABNORMAL LOW (ref 22–32)
Calcium: 8.5 mg/dL — ABNORMAL LOW (ref 8.9–10.3)
Chloride: 103 mmol/L (ref 98–111)
Creatinine, Ser: 1.09 mg/dL (ref 0.61–1.24)
GFR, Estimated: >60 mL/min (ref >60)
Glucose, Bld: 128 mg/dL — ABNORMAL HIGH (ref 70–99)
Potassium: 4.1 mmol/L (ref 3.5–5.1)
Sodium: 135 mmol/L (ref 135–145)

## 2022-07-21 LAB — HEPARIN LEVEL (UNFRACTIONATED): Heparin Unfractionated: >1.1 IU/mL — ABNORMAL HIGH (ref 0.30–0.70)

## 2022-07-21 LAB — CBC
HCT: 38.9 % — ABNORMAL LOW (ref 39.0–52.0)
Hemoglobin: 13.1 g/dL (ref 13.0–17.0)
MCH: 31.3 pg (ref 26.0–34.0)
MCHC: 33.7 g/dL (ref 30.0–36.0)
MCV: 93.1 fL (ref 80.0–100.0)
Platelets: 221 10*3/uL (ref 150–400)
RBC: 4.18 MIL/uL — ABNORMAL LOW (ref 4.22–5.81)
RDW: 13.2 % (ref 11.5–15.5)
WBC: 11.4 10*3/uL — ABNORMAL HIGH (ref 4.0–10.5)
nRBC: 0 % (ref 0.0–0.2)

## 2022-07-21 LAB — ECHOCARDIOGRAM COMPLETE
Area-P 1/2: 2.61 cm2
Height: 69 in
S' Lateral: 3.1 cm
Weight: 2617.3 oz

## 2022-07-21 LAB — APTT: aPTT: 60 seconds — ABNORMAL HIGH (ref 24–36)

## 2022-07-21 LAB — MRSA NEXT GEN BY PCR, NASAL: MRSA by PCR Next Gen: NOT DETECTED

## 2022-07-21 LAB — TROPONIN I (HIGH SENSITIVITY)
Troponin I (High Sensitivity): 1400 ng/L (ref <18)
Troponin I (High Sensitivity): 1065 ng/L (ref <18)

## 2022-07-21 MED ORDER — SODIUM CHLORIDE 0.9 % IV SOLN: 250.0000 mL | INTRAVENOUS | Status: DC | PRN

## 2022-07-21 MED ORDER — EZETIMIBE 10 MG PO TABS
10.0000 mg | ORAL_TABLET | Freq: Every day | ORAL | Status: DC
  Administered 2022-07-21 – 2022-08-01 (×11): 10 mg via ORAL
  Filled 2022-07-21 (×11): qty 1

## 2022-07-21 MED ORDER — HEPARIN SODIUM (PORCINE) 1000 UNIT/ML IJ SOLN
INTRAMUSCULAR | Status: AC
  Filled 2022-07-21: qty 10

## 2022-07-21 MED ORDER — LIDOCAINE HCL (PF) 1 % IJ SOLN
INTRAMUSCULAR | Status: AC
  Filled 2022-07-21: qty 30

## 2022-07-21 MED ORDER — CHLORHEXIDINE GLUCONATE CLOTH 2 % EX PADS: 6.0000 | MEDICATED_PAD | Freq: Every day | CUTANEOUS | Status: DC

## 2022-07-21 MED ORDER — SODIUM CHLORIDE 0.9% FLUSH: 3.0000 mL | INTRAVENOUS | Status: DC | PRN

## 2022-07-21 MED ORDER — HEPARIN (PORCINE) 25000 UT/250ML-% IV SOLN
1100.0000 [IU]/h | INTRAVENOUS | Status: DC
  Administered 2022-07-21: 900 [IU]/h via INTRAVENOUS
  Administered 2022-07-22 – 2022-07-23 (×3): 1100 [IU]/h via INTRAVENOUS
  Filled 2022-07-21 (×3): qty 250

## 2022-07-21 MED ORDER — ONDANSETRON HCL 4 MG/2ML IJ SOLN
4.0000 mg | Freq: Four times a day (QID) | INTRAMUSCULAR | Status: DC | PRN
  Administered 2022-07-24: 4 mg via INTRAVENOUS

## 2022-07-21 MED ORDER — METOPROLOL SUCCINATE ER 25 MG PO TB24: 12.5000 mg | ORAL_TABLET | Freq: Every day | ORAL | Status: DC

## 2022-07-21 MED ORDER — SODIUM CHLORIDE 0.9% FLUSH
3.0000 mL | Freq: Two times a day (BID) | INTRAVENOUS | Status: DC
  Administered 2022-07-21 – 2022-07-23 (×4): 3 mL via INTRAVENOUS

## 2022-07-21 MED ORDER — HYDRALAZINE HCL 20 MG/ML IJ SOLN: 10.0000 mg | INTRAMUSCULAR | Status: AC | PRN

## 2022-07-21 MED ORDER — IOHEXOL 350 MG/ML SOLN
INTRAVENOUS | Status: DC | PRN
  Administered 2022-07-21: 50 mL

## 2022-07-21 MED ORDER — ATORVASTATIN CALCIUM 80 MG PO TABS
80.0000 mg | ORAL_TABLET | Freq: Every day | ORAL | Status: DC
  Administered 2022-07-21 – 2022-08-01 (×11): 80 mg via ORAL
  Filled 2022-07-21 (×11): qty 1

## 2022-07-21 MED ORDER — ASPIRIN 81 MG PO CHEW
81.0000 mg | CHEWABLE_TABLET | Freq: Every day | ORAL | Status: DC
  Administered 2022-07-22 – 2022-07-23 (×2): 81 mg via ORAL
  Filled 2022-07-21 (×2): qty 1

## 2022-07-21 MED ORDER — VERAPAMIL HCL 2.5 MG/ML IV SOLN
INTRAVENOUS | Status: AC
  Filled 2022-07-21: qty 2

## 2022-07-21 MED ORDER — AMIODARONE HCL 200 MG PO TABS
200.0000 mg | ORAL_TABLET | Freq: Two times a day (BID) | ORAL | Status: DC
  Administered 2022-07-21 – 2022-07-25 (×9): 200 mg via ORAL
  Filled 2022-07-21 (×9): qty 1

## 2022-07-21 MED ORDER — HEPARIN SODIUM (PORCINE) 1000 UNIT/ML IJ SOLN
INTRAMUSCULAR | Status: DC | PRN
  Administered 2022-07-21: 3000 [IU] via INTRAVENOUS

## 2022-07-21 MED ORDER — LIDOCAINE HCL (PF) 1 % IJ SOLN
INTRAMUSCULAR | Status: DC | PRN
  Administered 2022-07-21: 2 mL via INTRADERMAL

## 2022-07-21 MED ORDER — HEPARIN (PORCINE) IN NACL 1000-0.9 UT/500ML-% IV SOLN
INTRAVENOUS | Status: DC | PRN
  Administered 2022-07-21 (×2): 500 mL

## 2022-07-21 MED ORDER — ACETAMINOPHEN 325 MG PO TABS: 650.0000 mg | ORAL_TABLET | ORAL | Status: DC | PRN

## 2022-07-21 MED ORDER — MIDAZOLAM HCL 2 MG/2ML IJ SOLN
INTRAMUSCULAR | Status: DC | PRN
  Administered 2022-07-21: 1 mg via INTRAVENOUS

## 2022-07-21 MED ORDER — FENTANYL CITRATE (PF) 100 MCG/2ML IJ SOLN
INTRAMUSCULAR | Status: DC | PRN
  Administered 2022-07-21: 25 ug via INTRAVENOUS

## 2022-07-21 MED ORDER — MIDAZOLAM HCL 2 MG/2ML IJ SOLN
INTRAMUSCULAR | Status: AC
  Filled 2022-07-21: qty 2

## 2022-07-21 MED ORDER — LABETALOL HCL 5 MG/ML IV SOLN: 10.0000 mg | INTRAVENOUS | Status: AC | PRN

## 2022-07-21 MED ORDER — HEPARIN SODIUM (PORCINE) 1000 UNIT/ML IJ SOLN
INTRAMUSCULAR | Status: DC | PRN
  Administered 2022-07-21: 5000 [IU] via INTRAVENOUS

## 2022-07-21 MED ORDER — FENTANYL CITRATE (PF) 100 MCG/2ML IJ SOLN
INTRAMUSCULAR | Status: AC
  Filled 2022-07-21: qty 2

## 2022-07-21 MED ORDER — SODIUM CHLORIDE 0.9 % IV SOLN: INTRAVENOUS | Status: AC

## 2022-07-21 MED ORDER — VERAPAMIL HCL 2.5 MG/ML IV SOLN
INTRAVENOUS | Status: DC | PRN
  Administered 2022-07-21: 10 mL via INTRA_ARTERIAL

## 2022-07-21 NOTE — ED Provider Notes (Signed)
[facility] CARDIAC CATH LAB Provider Note   CSN: [id number] Arrival date & time: 07/20/22  2334     History  Chief Complaint  Patient presents with   Chest Pain    Walter George is a 77 y.o. male.  77 year old male with history of A-fib status post ablation yesterday on Eliquis who presents ER today with transient back pain/pressure along with dyspnea worse with laying down.  Patient states there was some type of writing on his paperwork that if he had any shortness of breath especially laying flat that he should return to the ER.  Called EMS.  Vital signs stable and route.  EKG concerning for STEMI and route and activated per EMS protocol.  Patient pain-free at this time but still feels short of breath.  No fever or cough.  Have some chills earlier tonight.   Chest Pain      Home Medications Prior to Admission medications   Medication Sig Start Date End Date Taking? Authorizing Provider  acetaminophen (TYLENOL) 500 MG tablet Take 1,000 mg by mouth every 6 (six) hours as needed for headache or moderate pain.    [provider]  apixaban (ELIQUIS) 5 MG TABS tablet Take 1 tablet (5 mg total) by mouth 2 (two) times daily. 02/18/22   Newman Nip, NP  atorvastatin (LIPITOR) 80 MG tablet Take 1 tablet (80 mg total) by mouth daily. 05/13/22   [name], MD  ezetimibe (ZETIA) 10 MG tablet Take 1 tablet (10 mg total) by mouth daily. 06/25/22 06/25/23  [name], MD  Multiple Vitamin (MULTIVITAMIN WITH MINERALS) TABS tablet Take 1 tablet by mouth daily.    [provider]  ramipril (ALTACE) 1.25 MG capsule TAKE 1 CAPSULE(1.25 MG) BY MOUTH DAILY 03/22/22   Bensimhon, Bevelyn Buckles, MD  TURMERIC PO Take 2 tablets by mouth daily.    [provider]      Allergies    Amoxicillin    Review of Systems   Review of Systems  Cardiovascular:  Positive for chest pain.    Physical Exam Updated Vital Signs BP 122/79 (BP Location: Right  Arm)   Pulse 68   Temp 98.5 F (36.9 C) (Oral)   Resp (!) 23   SpO2 100%  Physical Exam Vitals and nursing note reviewed.  Constitutional:      Appearance: He is well-developed.  HENT:     Head: Normocephalic and atraumatic.  Cardiovascular:     Rate and Rhythm: Normal rate.  Pulmonary:     Effort: Pulmonary effort is normal. No respiratory distress.  Abdominal:     General: There is no distension.  Musculoskeletal:        General: Normal range of motion.     Cervical back: Normal range of motion.     Right lower leg: No edema.     Left lower leg: No edema.  Skin:    General: Skin is warm and dry.  Neurological:     Mental Status: He is alert.     ED Results / Procedures / Treatments   Labs (all labs ordered are listed, but only abnormal results are displayed) Labs Reviewed  I-STAT CHEM 8, ED - Abnormal; Notable for the following components:      Result Value   Glucose, Bld 127 (*)    Calcium, Ion 1.14 (*)    Hemoglobin 12.9 (*)    HCT 38.0 (*)    All other components within normal limits  I-STAT VENOUS BLOOD GAS, ED - Abnormal; Notable for the following components:   pH, Ven 7.460 (*)    pCO2, Ven 33.2 (*)    Calcium, Ion 1.11 (*)    HCT 38.0 (*)    Hemoglobin 12.9 (*)    All other components within normal limits    EKG EKG Interpretation  Date/Time:  Saturday Jul 20 2022 23:39:15 EDT Ventricular Rate:  69 PR Interval:  156 QRS Duration: 80 QT Interval:  361 QTC Calculation: 387 R Axis:   48 Text Interpretation: Sinus rhythm ST elevation suggests acute pericarditis Confirmed by Marily Memos [phone]) on 07/20/2022 11:49:23 PM  Radiology EP STUDY  Result Date: 07/19/2022 SURGEON:  Loman Brooklyn, MD PREPROCEDURE DIAGNOSES: 1. Paroxysmal atrial fibrillation. 2. Typical appearing atrial flutter POSTPROCEDURE DIAGNOSES: 1. Paroxysmal  atrial fibrillation. 2. Typical appearing atrial flutter PROCEDURES: 1. Comprehensive electrophysiologic study. 2. Coronary  sinus pacing and recording. 3. Three-dimensional mapping of atrial fibrillation with additional mapping and ablation of a second discrete focus (atrial flutter) 4. Ablation of atrial fibrillation with additional mapping and ablation of a second discrete focus (atrial flutter) 5. Intracardiac echocardiography. 6. Transseptal puncture of an intact septum. 7. Arrhythmia induction with pacing with isuprel infusion INTRODUCTION:  Walter George is a 77 y.o. male with a history of paroxysmal atrial fibrillation and typical appearing atrial flutter who now presents for EP study and radiofrequency ablation.  The patient reports initially being diagnosed with atrial fibrillation after presenting with symptomatic palpitations and fatgiue. The patient reports increasing frequency and duration of atrial arrhythmias since that time.  The patient therefore presents today for catheter ablation of atrial fibrillation and atrial flutter. DESCRIPTION OF PROCEDURE:  Informed written consent was obtained, and the patient was brought to the electrophysiology lab in a fasting state.  The patient was adequately sedated with intravenous medications as outlined in the anesthesia report.  The patient's left and right groins were prepped and draped in the usual sterile fashion by the EP lab staff.  Using a percutaneous Seldinger technique, two 8-French hemostasis sheaths were placed in the right femoral vein, and one 7 [place] and one 11-French hemostasis sheaths were placed into the left common femoral vein. An esophageal temperature probe was inserted to monitor for heating of the esophagus during the procedure.  Each sheath site was preclosed using an Abbott Perclose and closed at the end of the case. Direct ultrasound guidance is used for right and left femoral veins with normal vessel patency. Ultrasound images are captured and stored in the patient's chart. Using ultrasound guidance, the Brockenbrough needle and wire were visualized  entering the vessel. Catheter Placement:  A 7-French Biosense Webster Decapolar coronary sinus catheter was introduced through the right common femoral vein and advanced into the coronary sinus for recording and pacing from this location.  A luminal esophageal temperature probe was placed and used for continuous monitoring of the luminal esophageal temperature throughout the procedure as well as to localize the esophagus on fluoroscopy. In addition, the esophagus was directly visualized with intracardiac echo and its positioned marked on Carto.  During ablation at the posterior wall there was limited esophageal heating noted during RF energy delivery with the maximal temperature recorded by the luminal temperature probe of < 38.5 degrees C. Initial Measurements: The patient presented to the electrophysiology lab in sinus rhythm. his  PR interval measured 170 msec with a QRS duration of 69 msec and a QT interval of 428 msec.    Intracardiac Echocardiography: An  8-French Biosense Webster AcuNav intracardiac echocardiography catheter was introduced through the right common femoral vein and advanced into the right atrium. Intracardiac echocardiography was performed of the left atrium, and a three-dimensional anatomical rendering of the left atrium was performed using CARTO sound technology.  The patient was noted to have a moderate sized left atrium.  The interatrial septum was prominent but not aneurysmal. All 4 pulmonary veins were visualized and noted to have separate ostia.  The pulmonary veins were moderate in size.  The left atrial appendage was visualized and did not reveal thrombus.   There was no evidence of pulmonary vein stenosis. Transseptal Puncture: The right common femoral vein sheaths were exchanged for one 8.5 [employer] and one Bayless sheath and transseptal access was achieved with the Bayless in a standard fashion using a Bayless needle under fluoroscopy with intracardiac  echocardiography confirmation of the transseptal puncture.  Once transseptal access had been achieved, heparin was administered intravenously and intra- arterially in order to maintain an ACT of greater than 350 seconds throughout the procedure. 3D Mapping and Ablation: A 3.5 mm Biosense Webster SmartTouch Thermocool ablation catheter was advanced into the right atrium through the Visigo sheath.  The transseptal sheath was pulled back into the IVC over a guidewire.  The ablation catheter was advanced across the transseptal hole using the wire as a guide.  The transseptal sheath was then re-advanced over the guidewire into the left atrium.  A Biosense Webster octarray mapping catheter was introduced through the transseptal sheath and positioned over the mouth of all 4 pulmonary veins.  Three-dimensional electroanatomical mapping was performed using CARTO technology.  This demonstrated electrical activity within all four pulmonary veins at baseline. The patient underwent successful sequential electrical isolation and anatomical encircling of all four pulmonary veins using radiofrequency current with a circular mapping catheter as a guide.  A WACA approach was used.  Entrance and exit block were achieved. The ablation catheter was then pulled back into the right atrial and positioned along the cavo-tricuspid isthmus.  Mapping along the atrial side of the isthmus was performed.  This demonstrated a standard isthmus.  A series of radiofrequency applications were then delivered along the isthmus.  Complete bidirectional cavotricuspid isthmus block was achieved as confirmed by differential atrial pacing from the low lateral right atrium.  A stimulus to earliest atrial activation across the isthmus measured 145 msec bi-directionally.  The patient was observe without return of conduction through the isthmus. Measurements Following Ablation: Following ablation, dobutamine was infused up to 20 mcg/min with no inducible atrial  fibrillation, atrial tachycardia, atrial flutter, or sustained PACs. In sinus rhythm with RR interval was 774 msec, with PR 155 msec, QRS 69 msec, and QT 399 msec.  Following ablation the AH interval measured 77 msec with an HV interval of 43 msec. Ventricular pacing was performed, which revealed VA dissociation when pacing at 600 msec. Rapid atrial pacing was performed, which revealed an AV Wenckebach cycle length of 370 msec.  Electroisolation was then again confirmed in all four pulmonary veins. Intracardiac echocardiography was again performed, which revealed no pericardial effusion.  The procedure was therefore considered completed.  All catheters were removed, and the sheaths were aspirated and flushed.  The patient was transferred to the recovery area for sheath removal per protocol.  Intracardiac echocardiogram revealed no pericardial effusion. EBL<31ml.  There were no early apparent complications. CONCLUSIONS: 1. Sinus rhythm upon presentation.  2. Successful electrical isolation and anatomical encircling of all four pulmonary  veins with radiofrequency current.   3. Cavo-tricuspid isthmus ablation was performed with complete bidirectional isthmus block achieved. 4. No inducible arrhythmias following ablation both on and off of dobutamine 5. No early apparent complications. Will Camnitz,MD 9:03 AM 07/19/2022    Procedures .Critical Care  Performed by: Marily Memos, MD Authorized by: Marily Memos, MD   Critical care provider statement:    Critical care time (minutes):  30   Critical care was necessary to treat or prevent imminent or life-threatening deterioration of the following conditions:  Cardiac failure   Critical care was time spent personally by me on the following activities:  Development of treatment plan with patient or surrogate, discussions with consultants, evaluation of patient's response to treatment, examination of patient, ordering and review of laboratory studies, ordering and  review of radiographic studies, ordering and performing treatments and interventions, pulse oximetry, re-evaluation of patient's condition and review of old charts     Medications Ordered in ED Medications  lidocaine (PF) (XYLOCAINE) 1 % injection (2 mLs Intradermal Given 07/21/22 0013)  midazolam (VERSED) injection (1 mg Intravenous Given 07/21/22 0013)  fentaNYL (SUBLIMAZE) injection (25 mcg Intravenous Given 07/21/22 0013)  heparin sodium (porcine) injection (5,000 Units Intravenous Given 07/21/22 0026)    ED Course/ Medical Decision Making/ A&P                             Medical Decision Making  Vital signs stable here.  Repeat EKG here shows diffuse ST elevation that is new from most recent EKG in the system.  Quick bedside ultrasound did not show a large pericardial effusion.  Contacted cardiology.  They evaluate the patient will take for catheterization. Heparin given.    Final Clinical Impression(s) / ED Diagnoses Final diagnoses:  SOB (shortness of breath)  Acute thoracic back pain, unspecified back pain laterality    Rx / DC Orders ED Discharge Orders     None         Jamiere Gulas, Barbara Cower, MD 07/21/22 [phone]

## 2022-07-21 NOTE — Progress Notes (Signed)
ANTICOAGULATION CONSULT NOTE - Initial Consult  Pharmacy Consult for Heparin Indication: ACS and atrial fibrillation  Allergies  Allergen Reactions   Amoxicillin Nausea And Vomiting    Patient Measurements: Height: 5\' 9"  (175.3 cm) Weight: 74.2 kg (163 lb 9.3 oz) IBW/kg (Calculated) : 70.7  Vital Signs: Temp: 98.7 F (37.1 C) (05/05 0055) Temp Source: Oral (05/05 0055) BP: 105/66 (05/05 0245) Pulse Rate: 64 (05/05 0115)  Labs: Recent Labs    07/20/22 2339 07/20/22 2340 07/20/22 2344  HGB 12.9* 12.9* 13.1  HCT 38.0* 38.0* 38.9*  PLT  --   --  221  CREATININE 1.10  --  1.09  TROPONINIHS  --   --  1,400*    Estimated Creatinine Clearance: 58.6 mL/min (by C-G formula based on SCr of 1.09 mg/dL).   Medical History: Past Medical History:  Diagnosis Date   Atrial fibrillation (HCC)    CHF (congestive heart failure) (HCC)    Kidney stones    3 stones, last 20 years ago.   Plantar fasciitis     Medications:  Medications Prior to Admission  Medication Sig Dispense Refill Last Dose   acetaminophen (TYLENOL) 500 MG tablet Take 1,000 mg by mouth every 6 (six) hours as needed for headache or moderate pain.      apixaban (ELIQUIS) 5 MG TABS tablet Take 1 tablet (5 mg total) by mouth 2 (two) times daily. 180 tablet 1    atorvastatin (LIPITOR) 80 MG tablet Take 1 tablet (80 mg total) by mouth daily. 90 tablet 3    ezetimibe (ZETIA) 10 MG tablet Take 1 tablet (10 mg total) by mouth daily. 90 tablet 3    Multiple Vitamin (MULTIVITAMIN WITH MINERALS) TABS tablet Take 1 tablet by mouth daily.      ramipril (ALTACE) 1.25 MG capsule TAKE 1 CAPSULE(1.25 MG) BY MOUTH DAILY 90 capsule 11    TURMERIC PO Take 2 tablets by mouth daily.       Assessment: 77 y.o. male admitted with chest pain s/p emergent cath, h/o Afib and Eliquis on hold, for heparin.  Heparin to start 8 hours after sheath pull.   Goal of Therapy:  aPTT 66-102 sec Heparin level 0.3-0.7 units/ml Monitor platelets  by anticoagulation protocol: Yes   Plan:  Start heparin 900 units/hr at 1000 Check aPTT and heparin level in 8 hours   Eddie Candle 07/21/2022,3:03 AM

## 2022-07-21 NOTE — Progress Notes (Signed)
ANTICOAGULATION CONSULT NOTE   Pharmacy Consult for Heparin Indication: ACS and atrial fibrillation  Allergies  Allergen Reactions   Amoxicillin Nausea And Vomiting    Patient Measurements: Height: 5\' 9"  (175.3 cm) Weight: 74.2 kg (163 lb 9.3 oz) IBW/kg (Calculated) : 70.7  Vital Signs: Temp: 99.5 F (37.5 C) (05/05 1728) Temp Source: Oral (05/05 1728) BP: 121/70 (05/05 1728) Pulse Rate: 65 (05/05 1728)  Labs: Recent Labs    07/20/22 2339 07/20/22 2340 07/20/22 2344 07/21/22 0413 07/21/22 1729  HGB 12.9* 12.9* 13.1  --   --   HCT 38.0* 38.0* 38.9*  --   --   PLT  --   --  221  --   --   APTT  --   --   --   --  60*  HEPARINUNFRC  --   --   --   --  >1.10*  CREATININE 1.10  --  1.09  --   --   TROPONINIHS  --   --  1,400* 1,065*  --      Estimated Creatinine Clearance: 58.6 mL/min (by C-G formula based on SCr of 1.09 mg/dL).   Medical History: Past Medical History:  Diagnosis Date   Atrial fibrillation (HCC)    CHF (congestive heart failure) (HCC)    Kidney stones    3 stones, last 20 years ago.   Plantar fasciitis     Medications:  Medications Prior to Admission  Medication Sig Dispense Refill Last Dose   acetaminophen (TYLENOL) 500 MG tablet Take 1,000 mg by mouth every 6 (six) hours as needed for headache or moderate pain.   07/20/2022   apixaban (ELIQUIS) 5 MG TABS tablet Take 1 tablet (5 mg total) by mouth 2 (two) times daily. 180 tablet 1 07/20/2022 at 8p   atorvastatin (LIPITOR) 80 MG tablet Take 1 tablet (80 mg total) by mouth daily. 90 tablet 3 07/20/2022   ezetimibe (ZETIA) 10 MG tablet Take 1 tablet (10 mg total) by mouth daily. 90 tablet 3 07/20/2022   Multiple Vitamin (MULTIVITAMIN WITH MINERALS) TABS tablet Take 1 tablet by mouth daily.   07/20/2022   ramipril (ALTACE) 1.25 MG capsule TAKE 1 CAPSULE(1.25 MG) BY MOUTH DAILY 90 capsule 11 07/20/2022   TURMERIC PO Take 2 tablets by mouth daily.   07/20/2022    Assessment: 77 y.o. male admitted with chest  pain s/p emergent cath, h/o Afib and Eliquis on hold, for heparin.  Heparin to start 8 hours after sheath pull.  Initial aPTT slightly subtherapeutic at 60 seconds on 900 units/hr, anti-Xa level elevated as expected from Eliquis dose   Goal of Therapy:  aPTT 66-102 sec Heparin level 0.3-0.7 units/ml Monitor platelets by anticoagulation protocol: Yes   Plan:  Increase heparin gtt to 1100 units/hr F/u aPTT/anti-xa with AM labs to confirm  Daylene Posey, PharmD, [facility] Clinical Pharmacist ED Pharmacist Phone # [phone] 07/21/2022 6:11 PM

## 2022-07-21 NOTE — Progress Notes (Signed)
Echocardiogram 2D Echocardiogram has been performed.  Warren Lacy Myha Arizpe RDCS 07/21/2022, 3:54 PM

## 2022-07-21 NOTE — Consult Note (Signed)
[address]       [place] [address]             [phone]        [facility] Medical Record #[id number] Date of Birth: [birth year]  Referring: No ref. provider found Primary Care: Walter George Primary Cardiologist:None  Chief Complaint:    Chief Complaint  Patient presents with   Chest Pain    History of Present Illness:     77yo male with hx of afib, and elevated calcium score is admitted with worsening chest pain.  He underwent afib ablation on 07/19/22.  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  CTS has been consulted to assist with management.    Past Medical History:  Diagnosis Date   Atrial fibrillation (HCC)    CHF (congestive heart failure) (HCC)    Kidney stones    3 stones, last 20 years ago.   Plantar fasciitis     Past Surgical History:  Procedure Laterality Date   CARDIOVERSION N/A 03/26/2022   Procedure: CARDIOVERSION;  Surgeon: Walter George;  Location: [facility] ENDOSCOPY;  Service: Cardiovascular;  Laterality: N/A;   COLONOSCOPY     kidney stone removal     x2   TONSILLECTOMY  1953      Social History   Tobacco Use  Smoking Status Never  Smokeless Tobacco Never    Social History   Substance and Sexual Activity  Alcohol Use Yes   Alcohol/week: 9.0 standard drinks of alcohol   Types: 9 Standard drinks or equivalent per week   Comment: Occassionaly     Allergies  Allergen Reactions   Amoxicillin Nausea And Vomiting      Current Facility-Administered Medications  Medication Dose Route Frequency Provider Last Rate Last Admin   0.9 %  sodium chloride infusion  250 mL Intravenous PRN Walter George       acetaminophen (TYLENOL) tablet 650 mg  650 mg Oral Q4H PRN Walter George       [START ON 07/22/2022] aspirin chewable tablet 81 mg  81 mg Oral Daily Walter George       atorvastatin (LIPITOR) tablet 80 mg  80 mg Oral Daily Walter Hazel,  George   80 mg at 07/21/22 2130   Chlorhexidine Gluconate Cloth 2 % PADS 6 each  6 each Topical Daily Walter George       ezetimibe (ZETIA) tablet 10 mg  10 mg Oral Daily Walter George   10 mg at 07/21/22 8657   heparin ADULT infusion 100 units/mL (25000 units/250mL)  900 Units/hr Intravenous Continuous Walter George 9 mL/hr at 07/21/22 0900 900 Units/hr at 07/21/22 0900   ondansetron (ZOFRAN) injection 4 mg  4 mg Intravenous Q6H PRN Walter George       sodium chloride flush (NS) 0.9 % injection 3 mL  3 mL Intravenous Q12H Walter George       sodium chloride flush (NS) 0.9 % injection 3 mL  3 mL Intravenous PRN Walter George        Medications Prior to Admission  Medication Sig Dispense Refill Last Dose   acetaminophen (TYLENOL) 500 MG tablet Take 1,000 mg by mouth every 6 (six) hours as needed for headache or moderate pain.      apixaban (ELIQUIS) 5 MG TABS tablet Take 1 tablet (5 mg  total) by mouth 2 (two) times daily. 180 tablet 1    atorvastatin (LIPITOR) 80 MG tablet Take 1 tablet (80 mg total) by mouth daily. 90 tablet 3    ezetimibe (ZETIA) 10 MG tablet Take 1 tablet (10 mg total) by mouth daily. 90 tablet 3    Multiple Vitamin (MULTIVITAMIN WITH MINERALS) TABS tablet Take 1 tablet by mouth daily.      ramipril (ALTACE) 1.25 MG capsule TAKE 1 CAPSULE(1.25 MG) BY MOUTH DAILY 90 capsule 11    TURMERIC PO Take 2 tablets by mouth daily.       Family History  Problem Relation Age of Onset   Cancer Father        lymphoma   Dementia Mother    Dementia Paternal Aunt    Colon cancer Neg Hx    Rectal cancer Neg Hx    Stomach cancer Neg Hx    Esophageal cancer Neg Hx      Review of Systems:   Review of Systems  Constitutional:  Negative for malaise/fatigue.  Respiratory:  Negative for shortness of breath.   Cardiovascular:  Positive for chest pain. Negative for palpitations.      Physical Exam: BP  104/68   Pulse 63   Temp 99.2 F (37.3 C) (Oral)   Resp 20   Ht 5\' 9"  (1.753 m)   Wt 74.2 kg   SpO2 98%   BMI 24.16 kg/m  Physical Exam Constitutional:      General: He is not in acute distress.    Appearance: He is well-developed. He is not ill-appearing.  Eyes:     Extraocular Movements: Extraocular movements intact.  Cardiovascular:     Rate and Rhythm: Regular rhythm. Bradycardia present.  Pulmonary:     Effort: Pulmonary effort is normal.  Abdominal:     General: There is no abdominal bruit.  Musculoskeletal:        General: Normal range of motion.     Cervical back: Normal range of motion.     Right lower leg: No edema.     Left lower leg: No edema.  Skin:    General: Skin is warm and dry.  Neurological:     General: No focal deficit present.     Mental Status: He is alert and oriented to person, place, and time.       Diagnostic Studies & Laboratory data:    Left Heart Catherization:  Intervention  Echo: Pending EKG: Sinus brady I have independently reviewed the above radiologic studies and discussed with the patient   Recent Lab Findings: Lab Results  Component Value Date   WBC 11.4 (H) 07/20/2022   HGB 13.1 07/20/2022   HCT 38.9 (L) 07/20/2022   PLT 221 07/20/2022   GLUCOSE 128 (H) 07/20/2022   CHOL 93 02/27/2022   TRIG 31 02/27/2022   HDL 50 02/27/2022   LDLDIRECT 126.2 02/26/2011   LDLCALC 37 02/27/2022   ALT 20 05/29/2020   AST 37 05/29/2020   NA 135 07/20/2022   K 4.1 07/20/2022   CL 103 07/20/2022   CREATININE 1.09 07/20/2022   BUN 16 07/20/2022   CO2 21 (L) 07/20/2022   TSH 2.441 02/27/2022      Assessment / Plan:   77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  Echo is pending.  We have discussed the risks and benefits of surgical revascularization, and atriclip placement.  Final recs will be discussed once the echo has been completed.  I  spent 40 minutes counseling the patient face to face.   Walter George  Walter George 07/21/2022 10:14 AM

## 2022-07-21 NOTE — H&P (Incomplete)
Cardiology Admission History and Physical   Patient ID: [facility] MRN: [id number]; DOB: January 12, 1946   Admission date: 07/20/2022  PCP:  Rodrigo Ran, MD   [facility] Providers Cardiologist:  Shirlee Latch Electrophysiologist:  Will Jorja Loa, MD  { }   Chief Complaint:  Chest pain  History of Present Illness:   Walter George is a 77 yo male with history of CAD (abnormal CT calcium score, HLD, atrial fib who had an atrial fib ablation on 07/19/22 and began having substernal chest pain today. He describes the pain as substernal and worsened with deep inspiration. No dyspnea, diaphoresis or nausea. He is on Eliquis.    Past Medical History:  Diagnosis Date  . CHF (congestive heart failure) (HCC)   . Kidney stones    3 stones, last 20 years ago.  . Plantar fasciitis     Past Surgical History:  Procedure Laterality Date  . CARDIOVERSION N/A 03/26/2022   Procedure: CARDIOVERSION;  Surgeon: [name], MD;  Location: [facility] ENDOSCOPY;  Service: Cardiovascular;  Laterality: N/A;  . COLONOSCOPY    . kidney stone removal     x2  . TONSILLECTOMY  1953     Medications Prior to Admission: Prior to Admission medications   Medication Sig Start Date End Date Taking? Authorizing Provider  acetaminophen (TYLENOL) 500 MG tablet Take 1,000 mg by mouth every 6 (six) hours as needed for headache or moderate pain.    [provider]  apixaban (ELIQUIS) 5 MG TABS tablet Take 1 tablet (5 mg total) by mouth 2 (two) times daily. 02/18/22   Newman Nip, NP  atorvastatin (LIPITOR) 80 MG tablet Take 1 tablet (80 mg total) by mouth daily. 05/13/22   [name], MD  ezetimibe (ZETIA) 10 MG tablet Take 1 tablet (10 mg total) by mouth daily. 06/25/22 06/25/23  [name], MD  Multiple Vitamin (MULTIVITAMIN WITH MINERALS) TABS tablet Take 1 tablet by mouth daily.    [provider]  ramipril (ALTACE) 1.25 MG capsule TAKE 1 CAPSULE(1.25 MG) BY MOUTH DAILY  03/22/22   Bensimhon, Bevelyn Buckles, MD  TURMERIC PO Take 2 tablets by mouth daily.    [provider]     Allergies:    Allergies  Allergen Reactions  . Amoxicillin Nausea And Vomiting    Social History:   Social History   Socioeconomic History  . Marital status: Married    Spouse name: Not on file  . Number of children: 2  . Years of education: 63  . Highest education level: Not on file  Occupational History  . Occupation: Psychologist, occupational    Comment: retired  Tobacco Use  . Smoking status: Never  . Smokeless tobacco: Never  Substance and Sexual Activity  . Alcohol use: Yes    Alcohol/week: 9.0 standard drinks of alcohol    Types: 9 Standard drinks or equivalent per week    Comment: Occassionaly  . Drug use: No  . Sexual activity: Yes    Partners: Female  Other Topics Concern  . Not on file  Social History Narrative   HSG, [place] [employer]. Married '73. 1 dtr - '76, 1 son '79; 4 grandchildren. Work Chief of Staff, retired. Very active: sports, civic activities.                      Social Determinants of Health   Financial Resource Strain: Not on file  Food Insecurity: Not on file  Transportation Needs: Not  on file  Physical Activity: Not on file  Stress: Not on file  Social Connections: Not on file  Intimate Partner Violence: Not on file    Family History:  *** The patient's family history includes Cancer in his father; Dementia in his mother and paternal aunt. There is no history of Colon cancer, Rectal cancer, Stomach cancer, or Esophageal cancer.    ROS:  Please see the history of present illness.  ***All other ROS reviewed and negative.     Physical Exam/Data:   Vitals:   07/20/22 2340  BP: 122/79  Pulse: 68  Resp: (!) 23  Temp: 98.5 F (36.9 C)  TempSrc: Oral  SpO2: 100%   No intake or output data in the 24 hours ending 07/21/22 0002    07/19/2022    5:55 AM 05/13/2022   11:13 AM 03/26/2022    8:18 AM  Last 3 Weights  Weight (lbs) 150 lb 161 lb 160  lb  Weight (kg) 68.04 kg 73.029 kg 72.576 kg     There is no height or weight on file to calculate BMI.  General:  Well nourished, well developed, in no acute distress*** HEENT: normal Neck: no*** JVD Vascular: No carotid bruits; Distal pulses 2+ bilaterally   Cardiac:  normal S1, S2; RRR; no murmur *** Lungs:  clear to auscultation bilaterally, no wheezing, rhonchi or rales  Abd: soft, nontender, no hepatomegaly  Ext: no*** edema Musculoskeletal:  No deformities, BUE and BLE strength normal and equal Skin: warm and dry  Neuro:  CNs 2-12 intact, no focal abnormalities noted Psych:  Normal affect    EKG:  The ECG that was done *** was personally reviewed and demonstrates ***  Relevant CV Studies: ***  Laboratory Data:  High Sensitivity Troponin:  No results for input(s): "TROPONINIHS" in the last 720 hours.    Chemistry Recent Labs  Lab 07/20/22 2339 07/20/22 2340  NA 138 137  K 4.1 4.3  CL 106  --   GLUCOSE 127*  --   BUN 19  --   CREATININE 1.10  --     No results for input(s): "PROT", "ALBUMIN", "AST", "ALT", "ALKPHOS", "BILITOT" in the last 168 hours. Lipids No results for input(s): "CHOL", "TRIG", "HDL", "LABVLDL", "LDLCALC", "CHOLHDL" in the last 168 hours. Hematology Recent Labs  Lab 07/20/22 2339 07/20/22 2340  HGB 12.9* 12.9*  HCT 38.0* 38.0*   Thyroid No results for input(s): "TSH", "FREET4" in the last 168 hours. BNPNo results for input(s): "BNP", "PROBNP" in the last 168 hours.  DDimer No results for input(s): "DDIMER" in the last 168 hours.   Radiology/Studies:  No results found.   Assessment and Plan:   ***   Risk Assessment/Risk Scores:  {Complete the following score calculators/questions to meet required metrics.  Press F2:1}  {Is the patient being seen for unstable angina, ACS, NSTEMI or STEMI?[phone]} {Does this patient have CHF or CHF symptoms?      :[id number]} {Does this patient have ATRIAL  FIBRILLATION?[phone]}   Severity of Illness: {Observation/Inpatient:21159}   For questions or updates, please contact [facility] Please consult [email or web address] for contact info under     Signed, Verne Carrow, MD  07/21/2022 12:02 AM

## 2022-07-21 NOTE — Progress Notes (Signed)
Pt transferred to 6E09 via wheelchair and on telemetry monitoring. Pt tolerated transfer well and VSS. Pt received by 6E RN, Bonita Quin.

## 2022-07-21 NOTE — H&P (Signed)
Cardiology Admission History and Physical   Patient ID: Walter George MRN: [id number]; DOB: 1946/02/07   Admission date: 07/20/2022  PCP:  Rodrigo Ran, MD   [facility] Providers Cardiologist:  Shirlee Latch EP: [place]  Chief Complaint:  Chest pain  History of Present Illness:   Walter George is a 77 yo male with history of CAD (abnormal CT calcium score, HLD, atrial fib who had an atrial fib ablation on 07/19/22 and began having substernal chest pain today. He describes the pain as substernal and worsened with deep inspiration. No dyspnea, diaphoresis or nausea. He is on Eliquis.    Past Medical History:  Diagnosis Date   Atrial fibrillation (HCC)    CHF (congestive heart failure) (HCC)    Kidney stones    3 stones, last 20 years ago.   Plantar fasciitis     Past Surgical History:  Procedure Laterality Date   CARDIOVERSION N/A 03/26/2022   Procedure: CARDIOVERSION;  Surgeon: [name], MD;  Location: [facility] ENDOSCOPY;  Service: Cardiovascular;  Laterality: N/A;   COLONOSCOPY     kidney stone removal     x2   TONSILLECTOMY  1953     Medications Prior to Admission: Prior to Admission medications   Medication Sig Start Date End Date Taking? Authorizing Provider  acetaminophen (TYLENOL) 500 MG tablet Take 1,000 mg by mouth every 6 (six) hours as needed for headache or moderate pain.    [provider]  apixaban (ELIQUIS) 5 MG TABS tablet Take 1 tablet (5 mg total) by mouth 2 (two) times daily. 02/18/22   Newman Nip, NP  atorvastatin (LIPITOR) 80 MG tablet Take 1 tablet (80 mg total) by mouth daily. 05/13/22   [name], MD  ezetimibe (ZETIA) 10 MG tablet Take 1 tablet (10 mg total) by mouth daily. 06/25/22 06/25/23  [name], MD  Multiple Vitamin (MULTIVITAMIN WITH MINERALS) TABS tablet Take 1 tablet by mouth daily.    [provider]  ramipril (ALTACE) 1.25 MG capsule TAKE 1 CAPSULE(1.25 MG) BY MOUTH DAILY 03/22/22   Bensimhon,  Bevelyn Buckles, MD  TURMERIC PO Take 2 tablets by mouth daily.    [provider]     Allergies:    Allergies  Allergen Reactions   Amoxicillin Nausea And Vomiting    Social History:   Social History   Socioeconomic History   Marital status: Married    Spouse name: Not on file   Number of children: 2   Years of education: 16   Highest education level: Not on file  Occupational History   Occupation: banker    Comment: retired  Tobacco Use   Smoking status: Never   Smokeless tobacco: Never  Substance and Sexual Activity   Alcohol use: Yes    Alcohol/week: 9.0 standard drinks of alcohol    Types: 9 Standard drinks or equivalent per week    Comment: Occassionaly   Drug use: No   Sexual activity: Yes    Partners: Female  Other Topics Concern   Not on file  Social History Narrative   HSG, [employer]. Married '73. 1 dtr - '76, 1 son '79; 4 grandchildren. Work Chief of Staff, retired. Very active: sports, civic activities.                      Social Determinants of Health   Financial Resource Strain: Not on file  Food Insecurity: Not on file  Transportation Needs: Not on file  Physical Activity: Not on file  Stress: Not on file  Social Connections: Not on file  Intimate Partner Violence: Not on file    Family History:   The patient's family history includes Cancer in his father; Dementia in his mother and paternal aunt. There is no history of Colon cancer, Rectal cancer, Stomach cancer, or Esophageal cancer.    ROS:  Please see the history of present illness.  All other ROS reviewed and negative.     Physical Exam/Data:   Vitals:   07/20/22 2340  BP: 122/79  Pulse: 68  Resp: (!) 23  Temp: 98.5 F (36.9 C)  TempSrc: Oral  SpO2: 100%   No intake or output data in the 24 hours ending 07/21/22 0008    07/19/2022    5:55 AM 05/13/2022   11:13 AM 03/26/2022    8:18 AM  Last 3 Weights  Weight (lbs) 150 lb 161 lb 160 lb  Weight (kg) 68.04 kg 73.029 kg  72.576 kg     There is no height or weight on file to calculate BMI.  General:  Well nourished, well developed, in no acute distress HEENT: normal Neck: no JVD Vascular: No carotid bruits; Distal pulses 2+ bilaterally   Cardiac:  normal S1, S2; RRR; no murmur  Lungs:  clear to auscultation bilaterally, no wheezing, rhonchi or rales  Abd: soft, nontender, no hepatomegaly  Ext: no LE edema Musculoskeletal:  No deformities, BUE and BLE strength normal and equal Skin: warm and dry  Neuro:  CNs 2-12 intact, no focal abnormalities noted Psych:  Normal affect    EKG:  The ECG that was done was personally reviewed and demonstrates sinus with 1 mm ST elevation in the inferior and anterolateral leads  Relevant CV Studies:  Laboratory Data:  High Sensitivity Troponin:  No results for input(s): "TROPONINIHS" in the last 720 hours.    Chemistry Recent Labs  Lab 07/20/22 2339 07/20/22 2340  NA 138 137  K 4.1 4.3  CL 106  --   GLUCOSE 127*  --   BUN 19  --   CREATININE 1.10  --     No results for input(s): "PROT", "ALBUMIN", "AST", "ALT", "ALKPHOS", "BILITOT" in the last 168 hours. Lipids No results for input(s): "CHOL", "TRIG", "HDL", "LABVLDL", "LDLCALC", "CHOLHDL" in the last 168 hours. Hematology Recent Labs  Lab 07/20/22 2339 07/20/22 2340  HGB 12.9* 12.9*  HCT 38.0* 38.0*   Thyroid No results for input(s): "TSH", "FREET4" in the last 168 hours. BNPNo results for input(s): "BNP", "PROBNP" in the last 168 hours.  DDimer No results for input(s): "DDIMER" in the last 168 hours.   Radiology/Studies:  No results found.   Assessment and Plan:   Chest pain/Abnormal EKG: He has subtle ST elevation in the inferior and anterolateral leads with ongoing substernal chest pain. Will plan emergent cardiac cath.  Atrial fib/flutter,persistent: Sinus today post ablation. Will hold Eliquis briefly post cath.     Severity of Illness: The appropriate patient status for this patient  is INPATIENT. Inpatient status is judged to be reasonable and necessary in order to provide the required intensity of service to ensure the patient's safety. The patient's presenting symptoms, physical exam findings, and initial radiographic and laboratory data in the context of their chronic comorbidities is felt to place them at high risk for further clinical deterioration. Furthermore, it is not anticipated that the patient will be medically stable for discharge from the hospital within 2 midnights of admission.   *  I certify that at the point of admission it is my clinical judgment that the patient will require inpatient hospital care spanning beyond 2 midnights from the point of admission due to high intensity of service, high risk for further deterioration and high frequency of surveillance required.*   For questions or updates, please contact [facility] Please consult [email or web address] for contact info under     Signed, Verne Carrow, MD  07/21/2022 12:08 AM

## 2022-07-21 NOTE — Progress Notes (Signed)
Patient ID: Walter George, male   DOB: [birth year], 77 y.o.   MRN: [id number]     Advanced Heart Failure Rounding Note  PCP-Cardiologist: None   Subjective:    No further chest pain.  No complaints this morning.    Objective:   Weight Range: 74.2 kg Body mass index is 24.16 kg/m.   Vital Signs:   Temp:  [98.5 F (36.9 C)-99.2 F (37.3 C)] 99.2 F (37.3 C) (05/05 0815) Pulse Rate:  [0-68] 63 (05/05 0800) Resp:  [19-23] 20 (05/05 0900) BP: (99-122)/(61-79) 104/68 (05/05 0900) SpO2:  [95 %-100 %] 98 % (05/05 0800) Weight:  [74.2 kg] 74.2 kg (05/05 0055) Last BM Date : 07/20/22  Weight change: Filed Weights   07/21/22 0055  Weight: 74.2 kg    Intake/Output:   Intake/Output Summary (Last 24 hours) at 07/21/2022 1113 Last data filed at 07/21/2022 0900 Gross per 24 hour  Intake 0.56 ml  Output 200 ml  Net -199.44 ml      Physical Exam    General:  Well appearing. No resp difficulty HEENT: Normal Neck: Supple. JVP not elevated. Carotids 2+ bilat; no bruits. No lymphadenopathy or thyromegaly appreciated. Cor: PMI nondisplaced. Regular rate & rhythm. No rubs, gallops or murmurs. Lungs: Clear Abdomen: Soft, nontender, nondistended. No hepatosplenomegaly. No bruits or masses. Good bowel sounds. Extremities: No cyanosis, clubbing, rash, edema Neuro: Alert & orientedx3, cranial nerves grossly intact. moves all 4 extremities w/o difficulty. Affect pleasant   Telemetry   NSR 60s (personally reviewed)  Labs    CBC Recent Labs    07/20/22 2340 07/20/22 2344  WBC  --  11.4*  HGB 12.9* 13.1  HCT 38.0* 38.9*  MCV  --  93.1  PLT  --  221   Basic Metabolic Panel Recent Labs    86/57/84 2339 07/20/22 2340 07/20/22 2344  NA 138 137 135  K 4.1 4.3 4.1  CL 106  --  103  CO2  --   --  21*  GLUCOSE 127*  --  128*  BUN 19  --  16  CREATININE 1.10  --  1.09  CALCIUM  --   --  8.5*   Liver Function Tests No results for input(s): "AST", "ALT", "ALKPHOS",  "BILITOT", "PROT", "ALBUMIN" in the last 72 hours. No results for input(s): "LIPASE", "AMYLASE" in the last 72 hours. Cardiac Enzymes No results for input(s): "CKTOTAL", "CKMB", "CKMBINDEX", "TROPONINI" in the last 72 hours.  BNP: BNP (last 3 results) Recent Labs    02/27/22 1651  BNP 68.6    ProBNP (last 3 results) No results for input(s): "PROBNP" in the last 8760 hours.   D-Dimer No results for input(s): "DDIMER" in the last 72 hours. Hemoglobin A1C No results for input(s): "HGBA1C" in the last 72 hours. Fasting Lipid Panel No results for input(s): "CHOL", "HDL", "LDLCALC", "TRIG", "CHOLHDL", "LDLDIRECT" in the last 72 hours. Thyroid Function Tests No results for input(s): "TSH", "T4TOTAL", "T3FREE", "THYROIDAB" in the last 72 hours.  Invalid input(s): "FREET3"  Other results:   Imaging    CARDIAC CATHETERIZATION  Result Date: 07/21/2022   Prox RCA lesion is 30% stenosed.   Mid RCA lesion is 95% stenosed.   Mid RCA to Dist RCA lesion is 60% stenosed.   Ost Cx to Prox Cx lesion is 90% stenosed.   Ramus lesion is 99% stenosed.   1st Diag lesion is 100% stenosed.   Prox LAD to Mid LAD lesion is 70% stenosed.   The left  ventricular systolic function is normal.   LV end diastolic pressure is normal.   The left ventricular ejection fraction is 55-65% by visual estimate. Severe calcified mid LAD stenosis (RFR 0.88). Chronic occlusion of the small caliber Diagonal branch. This branch fills from right to left collaterals Severe ostial Circumflex stenosis Severe ostial/proximal intermediate branch stenosis. Large dominant RCA with heavy calcification throughout. The mid vessel has a severe calcified stenosis. The distal vessel has a moderate calcified stenosis. Normal LV filling pressure Recommendations: His presentation and EKG changes tonight could represent non-ACS chest pain post ablation. 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 (LAD stenosis is  flow limiting by pressure wire analysis tonight). Will admit and continue cardiac monitoring. Echo later today. Will hold Eliquis for now and start IV heparin post sheath pull. CT surgery will need to be called to discuss CABG. If his troponin is negative, he may be able to be discharged home and come back for CABG. Will start ASA and continue statin.     Medications:     Scheduled Medications:  [START ON 07/22/2022] aspirin  81 mg Oral Daily   atorvastatin  80 mg Oral Daily   Chlorhexidine Gluconate Cloth  6 each Topical Daily   ezetimibe  10 mg Oral Daily   sodium chloride flush  3 mL Intravenous Q12H    Infusions:  sodium chloride     heparin 900 Units/hr (07/21/22 0900)    PRN Medications: sodium chloride, acetaminophen, ondansetron (ZOFRAN) IV, sodium chloride flush   Assessment/Plan   1. CAD:  Patient had a coronary calcium score in 2020 placing him the 91st percentile for age and gender. The absolute calcium score number suggested significant risk for obstructive coronary disease. ETT-Cardiolite in 10/20 was low risk with no ischemia/infarction on perfusion images and excellent exercise tolerance. Patient has had no exertional symptoms, plays tennis and golf.  Has had paroxysmal atrial fibrillation becoming more frequent over the last year, had AF ablation on 5/2.  On 5/3, he developed pleuritic chest pain and came to the ER.  HS-TnI 1400=>1065.  Cath was done, showing severe 3 vessel disease with 95% mRCA, 90% ostial LCx, 99% ramus, 100% D1 with collaterals, 70% prox-mid LAD with abnormal RFR.  No vessel looked like acute plaque rupture.  Discussed with Dr. Lalla Brothers, suspect the pleuritic chest pain and TnI elevation were due to the AF ablation and not ACS.  With severe 3VD including LAD, would recommend CABG at this point.  - Seen by Dr. Cliffton Asters, plan for CABG Wednesday.  - Continue heparin gtt.  - Continue atorvastatin/Zetia.  - Check Lp(a).  If elevated, would aim to get him on  Repatha as outpatient.  - ASA 81.  - Will get echo.  2. Atrial fibrillation: Paroxysmal, s/p ablation on 5/2.  He is in NSR now.  - Would clip LA appendage with CABG.  - Risk for recurrence of AF perioperatively, will start amiodarone 200 mg bid and continue until a month or so post-op if he remains in NSR.   - Heparin gtt peri-operatively eventually back to apixaban.   Length of Stay: 0  Marca Ancona, MD  07/21/2022, 11:13 AM  Advanced Heart Failure Team Pager [phone] (M-F; 7a - 5p)  Please contact [facility] for night-coverage after hours (5p -7a ) and weekends on [email or web address]

## 2022-07-21 NOTE — Progress Notes (Signed)
   07/21/22 0019  Spiritual Encounters  Type of Visit Attempt (pt unavailable)  Care provided to: Pt not available  Referral source Code page  Reason for visit Code (STEMI)  OnCall Visit Yes   Patient was taken to cath lab. No family was present.   Arlyce Dice, Chaplain Resident

## 2022-07-21 NOTE — Progress Notes (Signed)
Patient transferred from [facility] at 1426hrs.  Oriented to room and plan of care for shift, patient verbalized understanding.  Wife at bedside.

## 2022-07-22 ENCOUNTER — Encounter (HOSPITAL_COMMUNITY): Payer: Self-pay | Admitting: Cardiovascular Disease

## 2022-07-22 DIAGNOSIS — I2510 Atherosclerotic heart disease of native coronary artery without angina pectoris: Secondary | ICD-10-CM

## 2022-07-22 DIAGNOSIS — I200 Unstable angina: Secondary | ICD-10-CM | POA: Diagnosis not present

## 2022-07-22 LAB — ABO/RH: ABO/RH(D): O POS

## 2022-07-22 LAB — CBC
HCT: 34.9 % — ABNORMAL LOW (ref 39.0–52.0)
Hemoglobin: 12.2 g/dL — ABNORMAL LOW (ref 13.0–17.0)
MCH: 31.7 pg (ref 26.0–34.0)
MCHC: 35 g/dL (ref 30.0–36.0)
MCV: 90.6 fL (ref 80.0–100.0)
Platelets: 203 10*3/uL (ref 150–400)
RBC: 3.85 MIL/uL — ABNORMAL LOW (ref 4.22–5.81)
RDW: 13.1 % (ref 11.5–15.5)
WBC: 7.7 10*3/uL (ref 4.0–10.5)
nRBC: 0 % (ref 0.0–0.2)

## 2022-07-22 LAB — SURGICAL PCR SCREEN
MRSA, PCR: NEGATIVE
Staphylococcus aureus: NEGATIVE

## 2022-07-22 LAB — APTT: aPTT: 96 seconds — ABNORMAL HIGH (ref 24–36)

## 2022-07-22 LAB — BASIC METABOLIC PANEL
Anion gap: 5 (ref 5–15)
BUN: 13 mg/dL (ref 8–23)
CO2: 20 mmol/L — ABNORMAL LOW (ref 22–32)
Calcium: 8.2 mg/dL — ABNORMAL LOW (ref 8.9–10.3)
Chloride: 108 mmol/L (ref 98–111)
Creatinine, Ser: 1.11 mg/dL (ref 0.61–1.24)
GFR, Estimated: >60 mL/min (ref >60)
Glucose, Bld: 98 mg/dL (ref 70–99)
Potassium: 3.9 mmol/L (ref 3.5–5.1)
Sodium: 133 mmol/L — ABNORMAL LOW (ref 135–145)

## 2022-07-22 LAB — HEPARIN LEVEL (UNFRACTIONATED): Heparin Unfractionated: 1.05 IU/mL — ABNORMAL HIGH (ref 0.30–0.70)

## 2022-07-22 LAB — LIPOPROTEIN A (LPA): Lipoprotein (a): 16.1 nmol/L (ref <75.0)

## 2022-07-22 NOTE — TOC Initial Note (Signed)
Transition of Care [facility]) - Initial/Assessment Note    Patient Details  Name: Walter George MRN: [id number] Date of Birth: 1945/10/07  Transition of Care [facility]) CM/SW Contact:    Elliot Cousin, RN Phone Number: 07/22/2022, 1:57 PM  Clinical Narrative:    HF TOC CM spoke to pt and wife at bedside. Pt was independent PTA. Discussed with patient HH preopertatively arranged with [facility].              Wife states pt will need Rollator for home. Will order DME closer to dc date.   Expected Discharge Plan: Home/Self Care Barriers to Discharge: Continued Medical Work up   Patient Goals and CMS Choice Patient states their goals for this hospitalization and ongoing recovery are:: wants remain independent CMS Medicare.gov Compare Post Acute Care list provided to:: Patient        Expected Discharge Plan and Services   Discharge Planning Services: CM Consult   Living arrangements for the past 2 months: Single Family Home                                      Prior Living Arrangements/Services Living arrangements for the past 2 months: Single Family Home Lives with:: Spouse Patient language and need for interpreter reviewed:: Yes Do you feel safe going back to the place where you live?: Yes      Need for Family Participation in Patient Care: No (Comment) Care giver support system in place?: Yes (comment)   Criminal Activity/Legal Involvement Pertinent to Current Situation/Hospitalization: No - Comment as needed  Activities of Daily Living Home Assistive Devices/Equipment: None ADL Screening (condition at time of admission) Patient's cognitive ability adequate to safely complete daily activities?: Yes Is the patient deaf or have difficulty hearing?: No Does the patient have difficulty seeing, even when wearing glasses/contacts?: No Does the patient have difficulty concentrating, remembering, or making decisions?: No Patient able to express need for assistance  with ADLs?: Yes Does the patient have difficulty dressing or bathing?: No Independently performs ADLs?: Yes (appropriate for developmental age) Does the patient have difficulty walking or climbing stairs?: No Weakness of Legs: None Weakness of Arms/Hands: None  Permission Sought/Granted Permission sought to share information with : Case Manager, Family Supports Permission granted to share information with : Yes, Verbal Permission Granted  Share Information with NAME: Bron Aarons     Permission granted to share info w Relationship: spouse  Permission granted to share info w Contact Information: [phone]  Emotional Assessment Appearance:: Appears stated age Attitude/Demeanor/Rapport: Engaged Affect (typically observed): Accepting Orientation: : Oriented to Self, Oriented to Place, Oriented to  Time, Oriented to Situation   Psych Involvement: No (comment)  Admission diagnosis:  Unstable angina (HCC) [I20.0] Patient Active Problem List   Diagnosis Date Noted   Unstable angina (HCC) 07/21/2022   Retinal tear, left 05/22/2021   Retinal tear, right 05/22/2021   Degenerative tear of medial meniscus of left knee 04/26/2014   Plantar fasciitis 02/27/2011   Routine health maintenance 02/27/2011   NEPHROLITHIASIS, HX OF 04/14/2007   PCP:  [name], MD Pharmacy:   [id number] - [place], [place] - [address] AT [facility] OF [address] & [address] [place] [address] Phone: [phone] Fax: [phone]     Social Determinants of Health (SDOH) Social History: SDOH Screenings   Food Insecurity: No Food  Insecurity (07/21/2022)  Housing: Low Risk  (07/21/2022)  Transportation Needs: No Transportation Needs (07/21/2022)  Utilities: Not At Risk (07/21/2022)  Tobacco Use: Low Risk  (07/22/2022)   SDOH Interventions:     Readmission Risk Interventions     No data to display

## 2022-07-22 NOTE — Progress Notes (Signed)
2 Days Post-Op Procedure(s) (LRB): CORONARY PRESSURE/FFR STUDY (N/A) LEFT HEART CATH AND CORONARY ANGIOGRAPHY (N/A) Subjective: I was asked to see Mr. Hakala by Dr. Lightfoot.  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  He was found to have atrial fibrillation and November 2023.  He had a cardioversion in January 2024.  He continued to have dyspnea when walking up inclines but has remained relatively active.  He underwent ablation for atrial fibrillation on 07/19/2022.  He presented back on the following evening with substernal chest pain.  He also was feeling short of breath.  He was admitted and noted to have elevated high-sensitivity troponins.  He underwent cardiac catheterization which showed severe three-vessel coronary disease.  He had a 70% proximal to mid LAD stenosis that was significant with FFR.    Dr. Lightfoot saw him over the weekend and recommended coronary bypass grafting but could not do it until later in the week.  I have schedule availability on Wednesday and offered Mr. Gali the option to have the surgery at that time.  He is currently pain-free.  Objective: Vital signs in last 24 hours: Temp:  [97.7 F (36.5 C)-99.7 F (37.6 C)] 98.8 F (37.1 C) (05/06 1258) Pulse Rate:  [64-69] 64 (05/06 1258) Cardiac Rhythm: Normal sinus rhythm (05/06 0700) Resp:  [18-20] 18 (05/06 1258) BP: (95-121)/(65-76) 115/65 (05/06 1258) SpO2:  [94 %-100 %] 97 % (05/06 1258)  Hemodynamic parameters for last 24 hours:    Intake/Output from previous day: 05/05 0701 - 05/06 0700 In: 811.7 [P.O.:600; I.V.:211.7] Out: -  Intake/Output this shift: Total I/O In: 56.1 [I.V.:56.1] Out: -   General appearance: alert, cooperative, and no distress Neurologic: intact Heart: regular rate and rhythm Lungs: clear to  auscultation bilaterally Extremities: Abnormal Allen's test on the left  Lab Results: Recent Labs    07/20/22 2344 07/22/22 0234  WBC 11.4* 7.7  HGB 13.1 12.2*  HCT 38.9* 34.9*  PLT 221 203   BMET:  Recent Labs    07/20/22 2344 07/22/22 0234  NA 135 133*  K 4.1 3.9  CL 103 108  CO2 21* 20*  GLUCOSE 128* 98  BUN 16 13  CREATININE 1.09 1.11  CALCIUM 8.5* 8.2*    PT/INR: No results for input(s): "LABPROT", "INR" in the last 72 hours. ABG    Component Value Date/Time   HCO3 23.6 07/20/2022 2340   TCO2 25 07/20/2022 2340   O2SAT 75 07/20/2022 2340   CBG (last 3)  No results for input(s): "GLUCAP" in the last 72 hours.  Assessment/Plan: S/P Procedure(s) (LRB): CORONARY PRESSURE/FFR STUDY (N/A) LEFT HEART CATH AND CORONARY ANGIOGRAPHY (N/A) 77-year-old gentleman with a past medical history significant for atrial fibrillation, hyperlipidemia, coronary calcification, kidney stones, and congestive heart failure.   He underwent ablation for atrial fibrillation on 07/19/2022 presented back the following day with substernal chest pain.  He ruled in for non-ST elevation MI.  At catheterization he has severe three-vessel coronary disease.  Normal ejection fraction by echocardiogram.  Has aortic sclerosis but no stenosis.  Coronary artery bypass grafting is indicated for survival benefit and relief of symptoms.  I discussed the proposed procedure with Mr. Couchman and his family.  I informed him of the general nature of the procedure including the need for general anesthesia, the incisions to be used, the use of cardiopulmonary bypass, the use of drainage tubes and pacemaker   wires postoperatively, as well as the expected hospital stay, and overall recovery.  I informed them of the indications, risks, benefits, and alternatives.  He understands the risks include, but not limited to death, MI, DVT, PE, bleeding, possible need for transfusion, stroke, infection, cardiac arrhythmias,  respiratory or renal failure, as well as possibility of other unforeseeable complications.  He accepts the risks and agrees to proceed.  Plan coronary bypass grafting on Wednesday, 07/24/2022     LOS: 1 day    Magdalina Whitehead C Aliah Eriksson 07/22/2022   

## 2022-07-22 NOTE — H&P (View-Only) (Signed)
2 Days Post-Op Procedure(s) (LRB): CORONARY PRESSURE/FFR STUDY (N/A) LEFT HEART CATH AND CORONARY ANGIOGRAPHY (N/A) Subjective: I was asked to see Walter George by Dr. Cliffton Asters.  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  He was found to have atrial fibrillation and November 2023.  He had a cardioversion in January 2024.  He continued to have dyspnea when walking up inclines but has remained relatively active.  He underwent ablation for atrial fibrillation on 07/19/2022.  He presented back on the following evening with substernal chest pain.  He also was feeling short of breath.  He was admitted and noted to have elevated high-sensitivity troponins.  He underwent cardiac catheterization which showed severe three-vessel coronary disease.  He had a 70% proximal to mid LAD stenosis that was significant with FFR.    Dr. Cliffton Asters saw him over the weekend and recommended coronary bypass grafting but could not do it until later in the week.  I have schedule availability on Wednesday and offered Walter George the option to have the surgery at that time.  He is currently pain-free.  Objective: Vital signs in last 24 hours: Temp:  [97.7 F (36.5 C)-99.7 F (37.6 C)] 98.8 F (37.1 C) (05/06 1258) Pulse Rate:  [64-69] 64 (05/06 1258) Cardiac Rhythm: Normal sinus rhythm (05/06 0700) Resp:  [18-20] 18 (05/06 1258) BP: (95-121)/(65-76) 115/65 (05/06 1258) SpO2:  [94 %-100 %] 97 % (05/06 1258)  Hemodynamic parameters for last 24 hours:    Intake/Output from previous day: 05/05 0701 - 05/06 0700 In: 811.7 [P.O.:600; I.V.:211.7] Out: -  Intake/Output this shift: Total I/O In: 56.1 [I.V.:56.1] Out: -   General appearance: alert, cooperative, and no distress Neurologic: intact Heart: regular rate and rhythm Lungs: clear to  auscultation bilaterally Extremities: Abnormal Allen's test on the left  Lab Results: Recent Labs    07/20/22 2344 07/22/22 0234  WBC 11.4* 7.7  HGB 13.1 12.2*  HCT 38.9* 34.9*  PLT 221 203   BMET:  Recent Labs    07/20/22 2344 07/22/22 0234  NA 135 133*  K 4.1 3.9  CL 103 108  CO2 21* 20*  GLUCOSE 128* 98  BUN 16 13  CREATININE 1.09 1.11  CALCIUM 8.5* 8.2*    PT/INR: No results for input(s): "LABPROT", "INR" in the last 72 hours. ABG    Component Value Date/Time   HCO3 23.6 07/20/2022 2340   TCO2 25 07/20/2022 2340   O2SAT 75 07/20/2022 2340   CBG (last 3)  No results for input(s): "GLUCAP" in the last 72 hours.  Assessment/Plan: S/P Procedure(s) (LRB): CORONARY PRESSURE/FFR STUDY (N/A) LEFT HEART CATH AND CORONARY ANGIOGRAPHY (N/A) 77 year old gentleman with a past medical history significant for atrial fibrillation, hyperlipidemia, coronary calcification, kidney stones, and congestive heart failure.   He underwent ablation for atrial fibrillation on 07/19/2022 presented back the following day with substernal chest pain.  He ruled in for non-ST elevation MI.  At catheterization he has severe three-vessel coronary disease.  Normal ejection fraction by echocardiogram.  Has aortic sclerosis but no stenosis.  Coronary artery bypass grafting is indicated for survival benefit and relief of symptoms.  I discussed the proposed procedure with Walter George and his family.  I informed him of the general nature of the procedure including the need for general anesthesia, the incisions to be used, the use of cardiopulmonary bypass, the use of drainage tubes and pacemaker  wires postoperatively, as well as the expected hospital stay, and overall recovery.  I informed them of the indications, risks, benefits, and alternatives.  He understands the risks include, but not limited to death, MI, DVT, PE, bleeding, possible need for transfusion, stroke, infection, cardiac arrhythmias,  respiratory or renal failure, as well as possibility of other unforeseeable complications.  He accepts the risks and agrees to proceed.  Plan coronary bypass grafting on Wednesday, 07/24/2022     LOS: 1 day    [name] 07/22/2022

## 2022-07-22 NOTE — Progress Notes (Signed)
Patient ID: Walter George, male   DOB: 02-07-1946, 77 y.o.   MRN: [id number]     Advanced Heart Failure Rounding Note  PCP-Cardiologist: None   Subjective:    Feels fine this morning. Bored. Denies CP.   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   Weight Range: 74.2 kg Body mass index is 24.16 kg/m.   Vital Signs:   Temp:  [97.7 F (36.5 C)-99.7 F (37.6 C)] 97.7 F (36.5 C) (05/06 0412) Pulse Rate:  [58-67] 67 (05/06 0412) Resp:  [18-25] 18 (05/06 0412) BP: (95-121)/(65-76) 111/76 (05/06 0412) SpO2:  [97 %-100 %] 98 % (05/06 0412) Last BM Date : 07/20/22  Weight change: Filed Weights   07/21/22 0055  Weight: 74.2 kg   Intake/Output:   Intake/Output Summary (Last 24 hours) at 07/22/2022 0821 Last data filed at 07/22/2022 0558 Gross per 24 hour  Intake 811.69 ml  Output --  Net 811.69 ml    Physical Exam  General:  well appearing.  No respiratory difficulty HEENT: normal Neck: supple. JVD ~7 cm. Carotids 2+ bilat; no bruits. No lymphadenopathy or thyromegaly appreciated. Cor: PMI nondisplaced. Regular rate & rhythm. No rubs, gallops or murmurs. Lungs: clear Abdomen: soft, nontender, nondistended. No hepatosplenomegaly. No bruits or masses. Good bowel sounds. Extremities: no cyanosis, clubbing, rash, edema  Neuro: alert & oriented x 3, cranial nerves grossly intact. moves all 4 extremities w/o difficulty. Affect pleasant.  Telemetry   NSR 70s, intt PVCs (personally reviewed)  Labs    CBC Recent Labs    07/20/22 2344 07/22/22 0234  WBC 11.4* 7.7  HGB 13.1 12.2*  HCT 38.9* 34.9*  MCV 93.1 90.6  PLT 221 203   Basic Metabolic Panel Recent Labs    40/98/11 2344 07/22/22 0234  NA 135 133*  K 4.1 3.9  CL 103 108  CO2 21* 20*  GLUCOSE 128* 98  BUN 16 13  CREATININE 1.09 1.11  CALCIUM 8.5* 8.2*   Liver Function Tests No results for input(s): "AST", "ALT", "ALKPHOS", "BILITOT",  "PROT", "ALBUMIN" in the last 72 hours. No results for input(s): "LIPASE", "AMYLASE" in the last 72 hours. Cardiac Enzymes No results for input(s): "CKTOTAL", "CKMB", "CKMBINDEX", "TROPONINI" in the last 72 hours.  BNP: BNP (last 3 results) Recent Labs    02/27/22 1651  BNP 68.6    ProBNP (last 3 results) No results for input(s): "PROBNP" in the last 8760 hours.   D-Dimer No results for input(s): "DDIMER" in the last 72 hours. Hemoglobin A1C No results for input(s): "HGBA1C" in the last 72 hours. Fasting Lipid Panel No results for input(s): "CHOL", "HDL", "LDLCALC", "TRIG", "CHOLHDL", "LDLDIRECT" in the last 72 hours. Thyroid Function Tests No results for input(s): "TSH", "T4TOTAL", "T3FREE", "THYROIDAB" in the last 72 hours.  Invalid input(s): "FREET3"  Other results:   Imaging    ECHOCARDIOGRAM COMPLETE  Result Date: 07/21/2022    ECHOCARDIOGRAM REPORT   Patient Name:   Walter George Date of Exam: 07/21/2022 Medical Rec #:  [id number]           Height:       69.0 in Accession #:    [id number]          Weight:       163.6 lb Date of Birth:  03-30-1945          BSA:          1.897 m Patient Age:  76 years            BP:           114/67 mmHg Patient Gender: M                   HR:           59 bpm. Exam Location:  Inpatient Procedure: 2D Echo, Color Doppler and Cardiac Doppler Indications:    Myocardial Infarction i21.9  History:        Patient has prior history of Echocardiogram examinations, most                 recent 02/13/2017. Arrythmias:Atrial Fibrillation.  Sonographer:    Irving Burton Senior RDCS Referring Phys: [name] IMPRESSIONS  1. Left ventricular ejection fraction, by estimation, is 55 to 60%. The left ventricle has low normal function. The left ventricle has no regional wall motion abnormalities. Left ventricular diastolic parameters were normal.  2. Right ventricular systolic function is normal. The right ventricular size is normal. Tricuspid  regurgitation signal is inadequate for assessing PA pressure.  3. The mitral valve is grossly normal. Trivial mitral valve regurgitation.  4. The aortic valve is tricuspid. There is moderate calcification of the aortic valve. There is moderate thickening of the aortic valve. Aortic valve regurgitation is not visualized. Aortic valve sclerosis/calcification is present, without any evidence of aortic stenosis.  5. The inferior vena cava is dilated in size with <50% respiratory variability, suggesting right atrial pressure of 15 mmHg.  6. 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 Evidence of atrial level shunting detected by color flow Doppler. Comparison(s): No significant change from prior study. FINDINGS  Left Ventricle: Left ventricular ejection fraction, by estimation, is 55 to 60%. The left ventricle has low normal function. The left ventricle has no regional wall motion abnormalities. The left ventricular internal cavity size was normal in size. There is no left ventricular hypertrophy. Left ventricular diastolic parameters were normal. Right Ventricle: The right ventricular size is normal. No increase in right ventricular wall thickness. Right ventricular systolic function is normal. Tricuspid regurgitation signal is inadequate for assessing PA pressure. Left Atrium: Left atrial size was normal in size. Right Atrium: Right atrial size was normal in size. Pericardium: There is no evidence of pericardial effusion. Mitral Valve: The mitral valve is grossly normal. Trivial mitral valve regurgitation. Tricuspid Valve: The tricuspid valve is normal in structure. Tricuspid valve regurgitation is trivial. Aortic Valve: The aortic valve is tricuspid. There is moderate calcification of the aortic valve. There is moderate thickening of the aortic valve. Aortic valve regurgitation is not visualized. Aortic valve sclerosis/calcification is present, without any  evidence of aortic stenosis. Pulmonic Valve:  The pulmonic valve was grossly normal. Pulmonic valve regurgitation is trivial. Aorta: The aortic root and ascending aorta are structurally normal, with no evidence of dilitation. Venous: The inferior vena cava is dilated in size with less than 50% respiratory variability, suggesting right atrial pressure of 15 mmHg. IAS/Shunts: Evidence of atrial level shunting detected by color flow Doppler.  LEFT VENTRICLE PLAX 2D LVIDd:         4.20 cm   Diastology LVIDs:         3.10 cm   LV e' medial:    10.90 cm/s LV PW:         0.70 cm   LV E/e' medial:  6.0 LV IVS:        0.90 cm   LV  e' lateral:   11.50 cm/s LVOT diam:     2.10 cm   LV E/e' lateral: 5.7 LV SV:         50 LV SV Index:   26 LVOT Area:     3.46 cm  RIGHT VENTRICLE RV S prime:     12.70 cm/s TAPSE (M-mode): 1.9 cm LEFT ATRIUM             Index        RIGHT ATRIUM           Index LA diam:        3.20 cm 1.69 cm/m   RA Area:     20.10 cm LA Vol (A2C):   53.0 ml 27.94 ml/m  RA Volume:   54.30 ml  28.63 ml/m LA Vol (A4C):   50.2 ml 26.47 ml/m LA Biplane Vol: 51.4 ml 27.10 ml/m  AORTIC VALVE LVOT Vmax:   73.10 cm/s LVOT Vmean:  54.600 cm/s LVOT VTI:    0.143 m  AORTA Ao Root diam: 3.50 cm Ao Asc diam:  3.60 cm MITRAL VALVE MV Area (PHT): 2.61 cm    SHUNTS MV Decel Time: 291 msec    Systemic VTI:  0.14 m MV E velocity: 65.70 cm/s  Systemic Diam: 2.10 cm MV A velocity: 31.50 cm/s MV E/A ratio:  2.09 Laurance Flatten MD Electronically signed by Laurance Flatten MD Signature Date/Time: 07/21/2022/4:01:09 PM    Final      Medications:     Scheduled Medications:  amiodarone  200 mg Oral BID   aspirin  81 mg Oral Daily   atorvastatin  80 mg Oral Daily   ezetimibe  10 mg Oral Daily   sodium chloride flush  3 mL Intravenous Q12H    Infusions:  sodium chloride     heparin 1,100 Units/hr (07/22/22 0805)    PRN Medications: sodium chloride, acetaminophen, ondansetron (ZOFRAN) IV, sodium chloride flush   Assessment/Plan  1. CAD:  Patient had a  coronary calcium score in 2020 placing him the 91st percentile for age and gender. The absolute calcium score number suggested significant risk for obstructive coronary disease. ETT-Cardiolite in 10/20 was low risk with no ischemia/infarction on perfusion images and excellent exercise tolerance. Patient has had no exertional symptoms, plays tennis and golf.  Has had paroxysmal atrial fibrillation becoming more frequent over the last year, had AF ablation on 5/2.  On 5/3, he developed pleuritic chest pain and came to the ER.  HS-TnI 1400=>1065.  Cath was done, showing severe 3 vessel disease with 95% mRCA, 90% ostial LCx, 99% ramus, 100% D1 with collaterals, 70% prox-mid LAD with abnormal RFR.  No vessel looked like acute plaque rupture.  Discussed with Dr. Lalla Brothers, suspect the pleuritic chest pain and TnI elevation were due to the AF ablation and not ACS.  With severe 3VD including LAD, would recommend CABG at this point.  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  - Seen by Dr. Cliffton Asters, plan for CABG Wednesday.  - Continue heparin gtt.  - Continue atorvastatin/Zetia.  - Check Lp(a).  If elevated, would aim to get him on Repatha as outpatient.  - ASA 81.  2. Atrial fibrillation: Paroxysmal, s/p ablation on 5/2.  He is in NSR now.  - Would clip LA appendage with CABG.  - Risk for recurrence of AF perioperatively, Continue amiodarone 200 mg bid and continue until a month or so post-op if  he remains in NSR.   - Heparin gtt peri-operatively eventually back to apixaban.   Length of Stay: 1  Alen Bleacher, NP  07/22/2022, 8:21 AM  Advanced Heart Failure Team Pager [phone] (M-F; 7a - 5p)  Please contact [facility] for night-coverage after hours (5p -7a ) and weekends on [email or web address]

## 2022-07-22 NOTE — Progress Notes (Signed)
CARDIAC REHAB PHASE I   OHS pre-op education including OHS booklet, OHS handout, IS use, sternal precautions, home needs at discharge and mobility importance reviewed with pt and wife. All questions and concerns addressed. Will continue to follow.   [id number]   Woodroe Chen, RN BSN 07/22/2022 11:55 AM

## 2022-07-22 NOTE — Progress Notes (Signed)
ANTICOAGULATION CONSULT NOTE   Pharmacy Consult for Heparin Indication: ACS and atrial fibrillation  Allergies  Allergen Reactions   Amoxicillin Nausea And Vomiting    Patient Measurements: Height: 5\' 9"  (175.3 cm) Weight: 74.2 kg (163 lb 9.3 oz) IBW/kg (Calculated) : 70.7  Vital Signs: Temp: 98.8 F (37.1 C) (05/06 1258) Temp Source: Oral (05/06 1258) BP: 115/65 (05/06 1258) Pulse Rate: 64 (05/06 1258)  Labs: Recent Labs    07/20/22 2339 07/20/22 2340 07/20/22 2344 07/21/22 0413 07/21/22 1729 07/22/22 0234  HGB 12.9* 12.9* 13.1  --   --  12.2*  HCT 38.0* 38.0* 38.9*  --   --  34.9*  PLT  --   --  221  --   --  203  APTT  --   --   --   --  60* 96*  HEPARINUNFRC  --   --   --   --  >1.10* 1.05*  CREATININE 1.10  --  1.09  --   --  1.11  TROPONINIHS  --   --  1,400* 1,065*  --   --      Estimated Creatinine Clearance: 57.5 mL/min (by C-G formula based on SCr of 1.11 mg/dL).   Medical History: Past Medical History:  Diagnosis Date   Atrial fibrillation (HCC)    CHF (congestive heart failure) (HCC)    Kidney stones    3 stones, last 20 years ago.   Plantar fasciitis     Medications:  Medications Prior to Admission  Medication Sig Dispense Refill Last Dose   acetaminophen (TYLENOL) 500 MG tablet Take 1,000 mg by mouth every 6 (six) hours as needed for headache or moderate pain.   07/20/2022   apixaban (ELIQUIS) 5 MG TABS tablet Take 1 tablet (5 mg total) by mouth 2 (two) times daily. 180 tablet 1 07/20/2022 at 8p   atorvastatin (LIPITOR) 80 MG tablet Take 1 tablet (80 mg total) by mouth daily. 90 tablet 3 07/20/2022   ezetimibe (ZETIA) 10 MG tablet Take 1 tablet (10 mg total) by mouth daily. 90 tablet 3 07/20/2022   Multiple Vitamin (MULTIVITAMIN WITH MINERALS) TABS tablet Take 1 tablet by mouth daily.   07/20/2022   ramipril (ALTACE) 1.25 MG capsule TAKE 1 CAPSULE(1.25 MG) BY MOUTH DAILY 90 capsule 11 07/20/2022   TURMERIC PO Take 2 tablets by mouth daily.   07/20/2022     Assessment: 77 y.o. male admitted with chest pain s/p emergent cath, h/o Afib and Eliquis on hold, for heparin.  Heparin to start 8 hours after sheath pull.  aPTT therapeutic at 96 seconds on 1100 units/hr, anti-Xa level falsely elevated as expected from Eliquis pta Cbc stable    Goal of Therapy:  aPTT 66-102 sec Heparin level 0.3-0.7 units/ml Monitor platelets by anticoagulation protocol: Yes   Plan:  Continue heparin gtt to 1100 units/hr Daily aptt, heparin level and cbc  Monitor s/s bleeding    Leota Sauers Pharm.D. CPP, BCPS Clinical Pharmacist [phone] 07/22/2022 2:39 PM

## 2022-07-23 ENCOUNTER — Inpatient Hospital Stay (HOSPITAL_COMMUNITY): Payer: Medicare Other

## 2022-07-23 DIAGNOSIS — I2510 Atherosclerotic heart disease of native coronary artery without angina pectoris: Secondary | ICD-10-CM | POA: Diagnosis not present

## 2022-07-23 DIAGNOSIS — Z01810 Encounter for preprocedural cardiovascular examination: Secondary | ICD-10-CM | POA: Diagnosis not present

## 2022-07-23 DIAGNOSIS — I200 Unstable angina: Secondary | ICD-10-CM | POA: Diagnosis not present

## 2022-07-23 LAB — LIPID PANEL
Cholesterol: 91 mg/dL (ref 0–200)
HDL: 46 mg/dL (ref >40)
LDL Cholesterol: 36 mg/dL (ref 0–99)
Total CHOL/HDL Ratio: 2 RATIO
Triglycerides: 47 mg/dL (ref <150)
VLDL: 9 mg/dL (ref 0–40)

## 2022-07-23 LAB — HEMOGLOBIN A1C
Hgb A1c MFr Bld: 5.6 % (ref 4.8–5.6)
Mean Plasma Glucose: 114.02 mg/dL

## 2022-07-23 LAB — CBC
HCT: 40 % (ref 39.0–52.0)
Hemoglobin: 12.9 g/dL — ABNORMAL LOW (ref 13.0–17.0)
MCH: 30.7 pg (ref 26.0–34.0)
MCHC: 32.3 g/dL (ref 30.0–36.0)
MCV: 95.2 fL (ref 80.0–100.0)
Platelets: 240 10*3/uL (ref 150–400)
RBC: 4.2 MIL/uL — ABNORMAL LOW (ref 4.22–5.81)
RDW: 12.9 % (ref 11.5–15.5)
WBC: 5.9 10*3/uL (ref 4.0–10.5)
nRBC: 0 % (ref 0.0–0.2)

## 2022-07-23 LAB — URINALYSIS, ROUTINE W REFLEX MICROSCOPIC
Bilirubin Urine: NEGATIVE
Glucose, UA: NEGATIVE mg/dL
Hgb urine dipstick: NEGATIVE
Ketones, ur: NEGATIVE mg/dL
Leukocytes,Ua: NEGATIVE
Nitrite: NEGATIVE
Protein, ur: NEGATIVE mg/dL
Specific Gravity, Urine: 1.011 (ref 1.005–1.030)
pH: 6 (ref 5.0–8.0)

## 2022-07-23 LAB — PROTIME-INR
INR: 1.1 (ref 0.8–1.2)
Prothrombin Time: 13.7 seconds (ref 11.4–15.2)

## 2022-07-23 LAB — BASIC METABOLIC PANEL
Anion gap: 8 (ref 5–15)
BUN: 22 mg/dL (ref 8–23)
CO2: 20 mmol/L — ABNORMAL LOW (ref 22–32)
Calcium: 8.5 mg/dL — ABNORMAL LOW (ref 8.9–10.3)
Chloride: 106 mmol/L (ref 98–111)
Creatinine, Ser: 1.36 mg/dL — ABNORMAL HIGH (ref 0.61–1.24)
GFR, Estimated: 54 mL/min — ABNORMAL LOW (ref >60)
Glucose, Bld: 92 mg/dL (ref 70–99)
Potassium: 4.1 mmol/L (ref 3.5–5.1)
Sodium: 134 mmol/L — ABNORMAL LOW (ref 135–145)

## 2022-07-23 LAB — TYPE AND SCREEN
ABO/RH(D): O POS
Antibody Screen: NEGATIVE

## 2022-07-23 LAB — HEPARIN LEVEL (UNFRACTIONATED): Heparin Unfractionated: 0.32 IU/mL (ref 0.30–0.70)

## 2022-07-23 LAB — SARS CORONAVIRUS 2 (TAT 6-24 HRS): SARS Coronavirus 2: NEGATIVE

## 2022-07-23 LAB — APTT: aPTT: 64 seconds — ABNORMAL HIGH (ref 24–36)

## 2022-07-23 LAB — VAS US DOPPLER PRE CABG

## 2022-07-23 MED ORDER — VANCOMYCIN HCL 1250 MG/250ML IV SOLN
1250.0000 mg | INTRAVENOUS | Status: AC
  Administered 2022-07-24: 1250 mg via INTRAVENOUS
  Filled 2022-07-23: qty 250

## 2022-07-23 MED ORDER — DIAZEPAM 5 MG PO TABS
5.0000 mg | ORAL_TABLET | Freq: Once | ORAL | Status: AC
  Administered 2022-07-24: 5 mg via ORAL
  Filled 2022-07-23: qty 1

## 2022-07-23 MED ORDER — DEXMEDETOMIDINE HCL IN NACL 400 MCG/100ML IV SOLN
0.1000 ug/kg/h | INTRAVENOUS | Status: AC
  Administered 2022-07-24: .5 ug/kg/h via INTRAVENOUS
  Filled 2022-07-23: qty 100

## 2022-07-23 MED ORDER — PLASMA-LYTE A IV SOLN
INTRAVENOUS | Status: DC
  Filled 2022-07-23: qty 2.5

## 2022-07-23 MED ORDER — MILRINONE LACTATE IN DEXTROSE 20-5 MG/100ML-% IV SOLN
0.3000 ug/kg/min | INTRAVENOUS | Status: DC
  Filled 2022-07-23: qty 100

## 2022-07-23 MED ORDER — CHLORHEXIDINE GLUCONATE CLOTH 2 % EX PADS
6.0000 | MEDICATED_PAD | Freq: Once | CUTANEOUS | Status: AC
  Administered 2022-07-23: 6 via TOPICAL

## 2022-07-23 MED ORDER — TRANEXAMIC ACID (OHS) PUMP PRIME SOLUTION
2.0000 mg/kg | INTRAVENOUS | Status: DC
  Filled 2022-07-23: qty 1.48

## 2022-07-23 MED ORDER — TRANEXAMIC ACID (OHS) BOLUS VIA INFUSION
15.0000 mg/kg | INTRAVENOUS | Status: AC
  Administered 2022-07-24: 1113 mg via INTRAVENOUS
  Filled 2022-07-23: qty 1113

## 2022-07-23 MED ORDER — EPINEPHRINE HCL 5 MG/250ML IV SOLN IN NS
0.0000 ug/min | INTRAVENOUS | Status: DC
  Filled 2022-07-23: qty 250

## 2022-07-23 MED ORDER — MAGNESIUM SULFATE 50 % IJ SOLN
40.0000 meq | INTRAMUSCULAR | Status: DC
  Filled 2022-07-23: qty 9.85

## 2022-07-23 MED ORDER — HEPARIN 30,000 UNITS/1000 ML (OHS) CELLSAVER SOLUTION
Status: DC
  Filled 2022-07-23: qty 1000

## 2022-07-23 MED ORDER — CEFAZOLIN SODIUM-DEXTROSE 2-4 GM/100ML-% IV SOLN
2.0000 g | INTRAVENOUS | Status: AC
  Administered 2022-07-24: 2 g via INTRAVENOUS
  Filled 2022-07-23: qty 100

## 2022-07-23 MED ORDER — NOREPINEPHRINE 4 MG/250ML-% IV SOLN
0.0000 ug/min | INTRAVENOUS | Status: DC
  Filled 2022-07-23: qty 250

## 2022-07-23 MED ORDER — TRANEXAMIC ACID 1000 MG/10ML IV SOLN
1.5000 mg/kg/h | INTRAVENOUS | Status: AC
  Administered 2022-07-24: 1.5 mg/kg/h via INTRAVENOUS
  Filled 2022-07-23: qty 25

## 2022-07-23 MED ORDER — METOPROLOL TARTRATE 12.5 MG HALF TABLET
12.5000 mg | ORAL_TABLET | Freq: Once | ORAL | Status: AC
  Administered 2022-07-24: 12.5 mg via ORAL
  Filled 2022-07-23: qty 1

## 2022-07-23 MED ORDER — NITROGLYCERIN IN D5W 200-5 MCG/ML-% IV SOLN
2.0000 ug/min | INTRAVENOUS | Status: DC
  Filled 2022-07-23: qty 250

## 2022-07-23 MED ORDER — INSULIN REGULAR(HUMAN) IN NACL 100-0.9 UT/100ML-% IV SOLN
INTRAVENOUS | Status: AC
  Administered 2022-07-24: 1.1 [IU]/h via INTRAVENOUS
  Filled 2022-07-23: qty 100

## 2022-07-23 MED ORDER — BISACODYL 5 MG PO TBEC
5.0000 mg | DELAYED_RELEASE_TABLET | Freq: Once | ORAL | Status: AC
  Administered 2022-07-23: 5 mg via ORAL
  Filled 2022-07-23: qty 1

## 2022-07-23 MED ORDER — CHLORHEXIDINE GLUCONATE 0.12 % MT SOLN
15.0000 mL | Freq: Once | OROMUCOSAL | Status: AC
  Administered 2022-07-24: 15 mL via OROMUCOSAL
  Filled 2022-07-23: qty 15

## 2022-07-23 MED ORDER — CHLORHEXIDINE GLUCONATE CLOTH 2 % EX PADS
6.0000 | MEDICATED_PAD | Freq: Once | CUTANEOUS | Status: AC
  Administered 2022-07-24: 6 via TOPICAL

## 2022-07-23 MED ORDER — PHENYLEPHRINE HCL-NACL 20-0.9 MG/250ML-% IV SOLN
30.0000 ug/min | INTRAVENOUS | Status: AC
  Administered 2022-07-24: 20 ug/min via INTRAVENOUS
  Filled 2022-07-23: qty 250

## 2022-07-23 MED ORDER — POTASSIUM CHLORIDE 2 MEQ/ML IV SOLN
80.0000 meq | INTRAVENOUS | Status: DC
  Filled 2022-07-23: qty 40

## 2022-07-23 NOTE — Anesthesia Preprocedure Evaluation (Signed)
Anesthesia Evaluation  Patient identified by MRN, date of birth, ID band Patient awake    Reviewed: Allergy & Precautions, H&P , NPO status , Patient's Chart, lab work & pertinent test results  Airway Mallampati: II  TM Distance: >3 FB Neck ROM: Full    Dental no notable dental hx. (+) Teeth Intact, Dental Advisory Given   Pulmonary neg pulmonary ROS   Pulmonary exam normal breath sounds clear to auscultation       Cardiovascular Exercise Tolerance: Good + angina  + CAD and +CHF  + dysrhythmias Atrial Fibrillation  Rhythm:Regular Rate:Normal     Neuro/Psych negative neurological ROS  negative psych ROS   GI/Hepatic negative GI ROS, Neg liver ROS,,,  Endo/Other  negative endocrine ROS    Renal/GU negative Renal ROS  negative genitourinary   Musculoskeletal  (+) Arthritis , Osteoarthritis,    Abdominal   Peds  Hematology negative hematology ROS (+)   Anesthesia Other Findings   Reproductive/Obstetrics negative OB ROS                             Anesthesia Physical Anesthesia Plan  ASA: 4  Anesthesia Plan: General   Post-op Pain Management: Tylenol PO (pre-op)*   Induction: Intravenous  PONV Risk Score and Plan: 2 and Midazolam and Ondansetron  Airway Management Planned: Oral ETT  Additional Equipment: Arterial line, CVP, PA Cath, TEE and Ultrasound Guidance Line Placement  Intra-op Plan:   Post-operative Plan: Post-operative intubation/ventilation  Informed Consent: I have reviewed the patients History and Physical, chart, labs and discussed the procedure including the risks, benefits and alternatives for the proposed anesthesia with the patient or authorized representative who has indicated his/her understanding and acceptance.     Dental advisory given  Plan Discussed with: CRNA  Anesthesia Plan Comments:        Anesthesia Quick Evaluation

## 2022-07-23 NOTE — Progress Notes (Addendum)
ANTICOAGULATION CONSULT NOTE   Pharmacy Consult for Heparin Indication: ACS and atrial fibrillation  Allergies  Allergen Reactions   Amoxicillin Nausea And Vomiting    Patient Measurements: Height: 5\' 9"  (175.3 cm) Weight: 74.2 kg (163 lb 9.3 oz) IBW/kg (Calculated) : 70.7  Vital Signs: Temp: 98.6 F (37 C) (05/07 0832) Temp Source: Oral (05/07 0832) BP: 121/81 (05/07 0832) Pulse Rate: 61 (05/07 0832)  Labs: Recent Labs    07/20/22 2344 07/21/22 0413 07/21/22 1729 07/22/22 0234 07/23/22 0321  HGB 13.1  --   --  12.2* 12.9*  HCT 38.9*  --   --  34.9* 40.0  PLT 221  --   --  203 240  APTT  --   --  60* 96* 64*  LABPROT  --   --   --   --  13.7  INR  --   --   --   --  1.1  HEPARINUNFRC  --   --  >1.10* 1.05* 0.32  CREATININE 1.09  --   --  1.11 1.36*  TROPONINIHS 1,400* 1,065*  --   --   --      Estimated Creatinine Clearance: 46.9 mL/min (A) (by C-G formula based on SCr of 1.36 mg/dL (H)).   Medical History: Past Medical History:  Diagnosis Date   Atrial fibrillation (HCC)    CHF (congestive heart failure) (HCC)    Kidney stones    3 stones, last 20 years ago.   Plantar fasciitis     Medications:  Medications Prior to Admission  Medication Sig Dispense Refill Last Dose   acetaminophen (TYLENOL) 500 MG tablet Take 1,000 mg by mouth every 6 (six) hours as needed for headache or moderate pain.   07/20/2022   apixaban (ELIQUIS) 5 MG TABS tablet Take 1 tablet (5 mg total) by mouth 2 (two) times daily. 180 tablet 1 07/20/2022 at 8p   atorvastatin (LIPITOR) 80 MG tablet Take 1 tablet (80 mg total) by mouth daily. 90 tablet 3 07/20/2022   ezetimibe (ZETIA) 10 MG tablet Take 1 tablet (10 mg total) by mouth daily. 90 tablet 3 07/20/2022   Multiple Vitamin (MULTIVITAMIN WITH MINERALS) TABS tablet Take 1 tablet by mouth daily.   07/20/2022   ramipril (ALTACE) 1.25 MG capsule TAKE 1 CAPSULE(1.25 MG) BY MOUTH DAILY 90 capsule 11 07/20/2022   TURMERIC PO Take 2 tablets by mouth  daily.   07/20/2022    Assessment: 77 y.o. male admitted with chest pain s/p emergent cath, h/o Afib and Eliquis on hold, for heparin.  Heparin to start 8 hours after sheath pull.  aPTT therapeutic at 66 seconds on 1100 units/hr, anti-Xa level previously falsely elevated as expected from Eliquis pta - now washed out and heparin level 0.3 at goal  Cbc stable    Goal of Therapy:  aPTT 66-102 sec Heparin level 0.3-0.7 units/ml Monitor platelets by anticoagulation protocol: Yes   Plan:  Continue heparin gtt to 1100 units/hr Daily aptt, heparin level and cbc  Monitor s/s bleeding  Follow up post CABG 5/8  Leota Sauers Pharm.D. CPP, BCPS Clinical Pharmacist [phone] 07/23/2022 10:28 AM

## 2022-07-23 NOTE — Progress Notes (Signed)
3 Days Post-Op Procedure(s) (LRB): CORONARY PRESSURE/FFR STUDY (N/A) LEFT HEART CATH AND CORONARY ANGIOGRAPHY (N/A) Subjective: No complaints this AM  Objective: Vital signs in last 24 hours: Temp:  [98.6 F (37 C)-98.9 F (37.2 C)] 98.9 F (37.2 C) (05/07 0552) Pulse Rate:  [51-69] 51 (05/07 0552) Cardiac Rhythm: Normal sinus rhythm (05/06 1908) Resp:  [18] 18 (05/07 0552) BP: (88-116)/(39-76) 109/63 (05/07 0552) SpO2:  [94 %-98 %] 96 % (05/07 0552)  Hemodynamic parameters for last 24 hours:    Intake/Output from previous day: 05/06 0701 - 05/07 0700 In: 56.1 [I.V.:56.1] Out: -  Intake/Output this shift: No intake/output data recorded.  General appearance: alert, cooperative, and no distress Neurologic: intact Heart: regular rate and rhythm Lungs: clear to auscultation bilaterally  Lab Results: Recent Labs    07/22/22 0234 07/23/22 0321  WBC 7.7 5.9  HGB 12.2* 12.9*  HCT 34.9* 40.0  PLT 203 240   BMET:  Recent Labs    07/22/22 0234 07/23/22 0321  NA 133* 134*  K 3.9 4.1  CL 108 106  CO2 20* 20*  GLUCOSE 98 92  BUN 13 22  CREATININE 1.11 1.36*  CALCIUM 8.2* 8.5*    PT/INR:  Recent Labs    07/23/22 0321  LABPROT 13.7  INR 1.1   ABG    Component Value Date/Time   HCO3 23.6 07/20/2022 2340   TCO2 25 07/20/2022 2340   O2SAT 75 07/20/2022 2340   CBG (last 3)  No results for input(s): "GLUCAP" in the last 72 hours.  Assessment/Plan: S/P Procedure(s) (LRB): CORONARY PRESSURE/FFR STUDY (N/A) LEFT HEART CATH AND CORONARY ANGIOGRAPHY (N/A) 3 vessel CAD, Hx A fib For CABG, left atrial appendage clip tomorrow AM No Cp or SOB overnight All questions answered   LOS: 2 days    [name] 07/23/2022

## 2022-07-23 NOTE — Progress Notes (Signed)
CARDIAC REHAB PHASE I   Pt resting in bed, feeling well today.Just finished breakfast and going to x-ray soon.  Pre-op education completed 5-6. No questions or concerns. Will continue to follow.    [id number] Woodroe Chen, RN BSN 07/23/2022 9:35 AM

## 2022-07-23 NOTE — Progress Notes (Signed)
Pre-CABG vascular studies completed.   Please see CV Procedures for preliminary results.  Joeline Freer, RVT  12:55 PM 07/23/22

## 2022-07-23 NOTE — Progress Notes (Signed)
Patient ID: Walter George, male   DOB: 1945/08/27, 77 y.o.   MRN: [id number]     Advanced Heart Failure Rounding Note  PCP-Cardiologist: None   Subjective:    No recurrent CP. Denies dyspnea. No complaints.    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   Weight Range: 74.2 kg Body mass index is 24.16 kg/m.   Vital Signs:   Temp:  [98.6 F (37 C)-98.9 F (37.2 C)] 98.6 F (37 C) (05/07 0832) Pulse Rate:  [51-64] 61 (05/07 0832) Resp:  [18] 18 (05/07 0832) BP: (88-121)/(39-81) 121/81 (05/07 0832) SpO2:  [96 %-98 %] 96 % (05/07 0832) Last BM Date : 07/22/22  Weight change: Filed Weights   07/21/22 0055  Weight: 74.2 kg   Intake/Output:   Intake/Output Summary (Last 24 hours) at 07/23/2022 0950 Last data filed at 07/22/2022 1106 Gross per 24 hour  Intake 56.07 ml  Output --  Net 56.07 ml    Physical Exam  General:  Well appearing. Sitting up in bed. HEENT: normal Neck: supple. no JVD. Carotids 2+ bilat; no bruits.  Cor: PMI nondisplaced. Regular rate & rhythm. No rubs, gallops or murmurs. Lungs: clear Abdomen: soft, nontender, nondistended.  Extremities: no cyanosis, clubbing, rash, edema Neuro: alert & orientedx3, cranial nerves grossly intact. Affect pleasant  Telemetry   SR/SB 50s-60s  Labs    CBC Recent Labs    07/22/22 0234 07/23/22 0321  WBC 7.7 5.9  HGB 12.2* 12.9*  HCT 34.9* 40.0  MCV 90.6 95.2  PLT 203 240   Basic Metabolic Panel Recent Labs    40/98/11 0234 07/23/22 0321  NA 133* 134*  K 3.9 4.1  CL 108 106  CO2 20* 20*  GLUCOSE 98 92  BUN 13 22  CREATININE 1.11 1.36*  CALCIUM 8.2* 8.5*   Liver Function Tests No results for input(s): "AST", "ALT", "ALKPHOS", "BILITOT", "PROT", "ALBUMIN" in the last 72 hours. No results for input(s): "LIPASE", "AMYLASE" in the last 72 hours. Cardiac Enzymes No results for input(s): "CKTOTAL", "CKMB", "CKMBINDEX",  "TROPONINI" in the last 72 hours.  BNP: BNP (last 3 results) Recent Labs    02/27/22 1651  BNP 68.6    ProBNP (last 3 results) No results for input(s): "PROBNP" in the last 8760 hours.   D-Dimer No results for input(s): "DDIMER" in the last 72 hours. Hemoglobin A1C Recent Labs    07/23/22 0321  HGBA1C 5.6   Fasting Lipid Panel Recent Labs    07/23/22 0321  CHOL 91  HDL 46  LDLCALC 36  TRIG 47  CHOLHDL 2.0   Thyroid Function Tests No results for input(s): "TSH", "T4TOTAL", "T3FREE", "THYROIDAB" in the last 72 hours.  Invalid input(s): "FREET3"  Other results:   Imaging    No results found.   Medications:     Scheduled Medications:  amiodarone  200 mg Oral BID   aspirin  81 mg Oral Daily   atorvastatin  80 mg Oral Daily   [START ON 07/24/2022] epinephrine  0-10 mcg/min Intravenous To OR   ezetimibe  10 mg Oral Daily   [START ON 07/24/2022] heparin sodium (porcine) 2,500 Units, papaverine 30 mg in electrolyte-A (PLASMALYTE-A PH 7.4) 500 mL irrigation   Irrigation To OR   [START ON 07/24/2022] insulin   Intravenous To OR   [START ON 07/24/2022] magnesium sulfate  40 mEq Other To OR   [START ON 07/24/2022] phenylephrine  30-200 mcg/min  Intravenous To OR   [START ON 07/24/2022] potassium chloride  80 mEq Other To OR   sodium chloride flush  3 mL Intravenous Q12H   [START ON 07/24/2022] tranexamic acid  15 mg/kg Intravenous To OR   [START ON 07/24/2022] tranexamic acid  2 mg/kg Intracatheter To OR    Infusions:  sodium chloride     [START ON 07/24/2022]  ceFAZolin (ANCEF) IV     [START ON 07/24/2022]  ceFAZolin (ANCEF) IV     [START ON 07/24/2022] dexmedetomidine     [START ON 07/24/2022] heparin 30,000 units/NS 1000 mL solution for CELLSAVER     heparin 1,100 Units/hr (07/23/22 0710)   [START ON 07/24/2022] milrinone     [START ON 07/24/2022] nitroGLYCERIN     [START ON 07/24/2022] norepinephrine     [START ON 07/24/2022] tranexamic acid (CYKLOKAPRON) 2,500 mg in sodium chloride  0.9 % 250 mL (10 mg/mL) infusion     [START ON 07/24/2022] vancomycin      PRN Medications: sodium chloride, acetaminophen, ondansetron (ZOFRAN) IV, sodium chloride flush   Assessment/Plan  1. CAD:  Patient had a coronary calcium score in 2020 placing him the 91st percentile for age and gender. The absolute calcium score number suggested significant risk for obstructive coronary disease. ETT-Cardiolite in 10/20 was low risk with no ischemia/infarction on perfusion images and excellent exercise tolerance. Patient has had no exertional symptoms, plays tennis and golf.  Has had paroxysmal atrial fibrillation becoming more frequent over the last year, had AF ablation on 5/2.  On 5/3, he developed pleuritic chest pain and came to the ER.  HS-TnI 1400=>1065.  Cath was done, showing severe 3 vessel disease with 95% mRCA, 90% ostial LCx, 99% ramus, 100% D1 with collaterals, 70% prox-mid LAD with abnormal RFR.  No vessel looked like acute plaque rupture.  Discussed with Dr. Lalla Brothers, suspect the pleuritic chest pain and TnI elevation were due to the AF ablation and not ACS.   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  - Seen by TCTS, plan for CABG 05/08 - Stable. No recurrent CP.  - Continue heparin gtt.  - Continue atorvastatin/Zetia.  - Check Lp(a) 16.1 - ASA 81.  2. Atrial fibrillation: Paroxysmal, s/p ablation on 5/2.  He is in NSR now.  - Planning LA appendage with CABG.  - Risk for recurrence of AF perioperatively, Continue amiodarone 200 mg bid and continue until a month or so post-op if he remains in NSR.   - Heparin gtt peri-operatively eventually back to apixaban.   Length of Stay: 2  Walter Gandolfi N, PA-C  07/23/2022, 9:50 AM  Advanced Heart Failure Team Pager [phone] (M-F; 7a - 5p)  Please contact [facility] for night-coverage after hours (5p -7a ) and weekends on [email or web address]

## 2022-07-24 ENCOUNTER — Inpatient Hospital Stay (HOSPITAL_COMMUNITY): Payer: Medicare Other

## 2022-07-24 ENCOUNTER — Inpatient Hospital Stay (HOSPITAL_COMMUNITY): Payer: Medicare Other | Admitting: Anesthesiology

## 2022-07-24 ENCOUNTER — Inpatient Hospital Stay (HOSPITAL_COMMUNITY)
Admission: EM | Disposition: A | Discharge status: Home / Self Care | Payer: Medicare Other | Source: Home / Self Care | Attending: Thoracic Surgery (Cardiothoracic Vascular Surgery)

## 2022-07-24 ENCOUNTER — Other Ambulatory Visit: Payer: Self-pay

## 2022-07-24 DIAGNOSIS — I252 Old myocardial infarction: Secondary | ICD-10-CM

## 2022-07-24 DIAGNOSIS — Z951 Presence of aortocoronary bypass graft: Secondary | ICD-10-CM

## 2022-07-24 DIAGNOSIS — I4891 Unspecified atrial fibrillation: Secondary | ICD-10-CM

## 2022-07-24 DIAGNOSIS — I509 Heart failure, unspecified: Secondary | ICD-10-CM | POA: Diagnosis not present

## 2022-07-24 DIAGNOSIS — I25119 Atherosclerotic heart disease of native coronary artery with unspecified angina pectoris: Secondary | ICD-10-CM | POA: Diagnosis not present

## 2022-07-24 DIAGNOSIS — I214 Non-ST elevation (NSTEMI) myocardial infarction: Secondary | ICD-10-CM

## 2022-07-24 DIAGNOSIS — I2510 Atherosclerotic heart disease of native coronary artery without angina pectoris: Secondary | ICD-10-CM | POA: Diagnosis not present

## 2022-07-24 DIAGNOSIS — I200 Unstable angina: Secondary | ICD-10-CM | POA: Diagnosis not present

## 2022-07-24 HISTORY — PX: CLIPPING OF ATRIAL APPENDAGE: SHX5773

## 2022-07-24 HISTORY — PX: TEE WITHOUT CARDIOVERSION: SHX5443

## 2022-07-24 HISTORY — PX: CORONARY ARTERY BYPASS GRAFT: SHX141

## 2022-07-24 HISTORY — PX: AORTIC ENDARTERECETOMY: SHX5724

## 2022-07-24 LAB — MAGNESIUM: Magnesium: 2.8 mg/dL — ABNORMAL HIGH (ref 1.7–2.4)

## 2022-07-24 LAB — POCT I-STAT 7, (LYTES, BLD GAS, ICA,H+H)
Acid-base deficit: 3 mmol/L — ABNORMAL HIGH (ref 0.0–2.0)
Acid-base deficit: 3 mmol/L — ABNORMAL HIGH (ref 0.0–2.0)
Acid-base deficit: 3 mmol/L — ABNORMAL HIGH (ref 0.0–2.0)
Acid-base deficit: 4 mmol/L — ABNORMAL HIGH (ref 0.0–2.0)
Acid-base deficit: 5 mmol/L — ABNORMAL HIGH (ref 0.0–2.0)
Acid-base deficit: 6 mmol/L — ABNORMAL HIGH (ref 0.0–2.0)
Bicarbonate: 22.4 mmol/L (ref 20.0–28.0)
Bicarbonate: 21.4 mmol/L (ref 20.0–28.0)
Bicarbonate: 22.3 mmol/L (ref 20.0–28.0)
Bicarbonate: 21.3 mmol/L (ref 20.0–28.0)
Bicarbonate: 20.4 mmol/L (ref 20.0–28.0)
Bicarbonate: 19.2 mmol/L — ABNORMAL LOW (ref 20.0–28.0)
Calcium, Ion: 1.29 mmol/L (ref 1.15–1.40)
Calcium, Ion: 1 mmol/L — ABNORMAL LOW (ref 1.15–1.40)
Calcium, Ion: 1.35 mmol/L (ref 1.15–1.40)
Calcium, Ion: 1.3 mmol/L (ref 1.15–1.40)
Calcium, Ion: 1.34 mmol/L (ref 1.15–1.40)
Calcium, Ion: 1.25 mmol/L (ref 1.15–1.40)
HCT: 34 % — ABNORMAL LOW (ref 39.0–52.0)
HCT: 29 % — ABNORMAL LOW (ref 39.0–52.0)
HCT: 30 % — ABNORMAL LOW (ref 39.0–52.0)
HCT: 30 % — ABNORMAL LOW (ref 39.0–52.0)
HCT: 27 % — ABNORMAL LOW (ref 39.0–52.0)
HCT: 27 % — ABNORMAL LOW (ref 39.0–52.0)
Hemoglobin: 11.6 g/dL — ABNORMAL LOW (ref 13.0–17.0)
Hemoglobin: 9.9 g/dL — ABNORMAL LOW (ref 13.0–17.0)
Hemoglobin: 10.2 g/dL — ABNORMAL LOW (ref 13.0–17.0)
Hemoglobin: 10.2 g/dL — ABNORMAL LOW (ref 13.0–17.0)
Hemoglobin: 9.2 g/dL — ABNORMAL LOW (ref 13.0–17.0)
Hemoglobin: 9.2 g/dL — ABNORMAL LOW (ref 13.0–17.0)
O2 Saturation: 100 %
O2 Saturation: 100 %
O2 Saturation: 100 %
O2 Saturation: 98 %
O2 Saturation: 99 %
O2 Saturation: 99 %
Patient temperature: 35.9
Patient temperature: 37.4
Patient temperature: 37.6
Potassium: 4.4 mmol/L (ref 3.5–5.1)
Potassium: 4.8 mmol/L (ref 3.5–5.1)
Potassium: 5.6 mmol/L — ABNORMAL HIGH (ref 3.5–5.1)
Potassium: 5 mmol/L (ref 3.5–5.1)
Potassium: 5.5 mmol/L — ABNORMAL HIGH (ref 3.5–5.1)
Potassium: 5.1 mmol/L (ref 3.5–5.1)
Sodium: 139 mmol/L (ref 135–145)
Sodium: 136 mmol/L (ref 135–145)
Sodium: 138 mmol/L (ref 135–145)
Sodium: 140 mmol/L (ref 135–145)
Sodium: 137 mmol/L (ref 135–145)
Sodium: 139 mmol/L (ref 135–145)
TCO2: 24 mmol/L (ref 22–32)
TCO2: 22 mmol/L (ref 22–32)
TCO2: 23 mmol/L (ref 22–32)
TCO2: 22 mmol/L (ref 22–32)
TCO2: 21 mmol/L — ABNORMAL LOW (ref 22–32)
TCO2: 20 mmol/L — ABNORMAL LOW (ref 22–32)
pCO2 arterial: 40.9 mmHg (ref 32–48)
pCO2 arterial: 34.8 mmHg (ref 32–48)
pCO2 arterial: 38.6 mmHg (ref 32–48)
pCO2 arterial: 36 mmHg (ref 32–48)
pCO2 arterial: 37.4 mmHg (ref 32–48)
pCO2 arterial: 35.3 mmHg (ref 32–48)
pH, Arterial: 7.346 — ABNORMAL LOW (ref 7.35–7.45)
pH, Arterial: 7.397 (ref 7.35–7.45)
pH, Arterial: 7.369 (ref 7.35–7.45)
pH, Arterial: 7.375 (ref 7.35–7.45)
pH, Arterial: 7.346 — ABNORMAL LOW (ref 7.35–7.45)
pH, Arterial: 7.345 — ABNORMAL LOW (ref 7.35–7.45)
pO2, Arterial: 532 mmHg — ABNORMAL HIGH (ref 83–108)
pO2, Arterial: 548 mmHg — ABNORMAL HIGH (ref 83–108)
pO2, Arterial: 348 mmHg — ABNORMAL HIGH (ref 83–108)
pO2, Arterial: 109 mmHg — ABNORMAL HIGH (ref 83–108)
pO2, Arterial: 148 mmHg — ABNORMAL HIGH (ref 83–108)
pO2, Arterial: 157 mmHg — ABNORMAL HIGH (ref 83–108)

## 2022-07-24 LAB — BASIC METABOLIC PANEL
Anion gap: 9 (ref 5–15)
Anion gap: 11 (ref 5–15)
BUN: 20 mg/dL (ref 8–23)
BUN: 16 mg/dL (ref 8–23)
CO2: 21 mmol/L — ABNORMAL LOW (ref 22–32)
CO2: 19 mmol/L — ABNORMAL LOW (ref 22–32)
Calcium: 8.5 mg/dL — ABNORMAL LOW (ref 8.9–10.3)
Calcium: 8.8 mg/dL — ABNORMAL LOW (ref 8.9–10.3)
Chloride: 108 mmol/L (ref 98–111)
Chloride: 107 mmol/L (ref 98–111)
Creatinine, Ser: 1.16 mg/dL (ref 0.61–1.24)
Creatinine, Ser: 1.15 mg/dL (ref 0.61–1.24)
GFR, Estimated: >60 mL/min (ref >60)
GFR, Estimated: >60 mL/min (ref >60)
Glucose, Bld: 93 mg/dL (ref 70–99)
Glucose, Bld: 105 mg/dL — ABNORMAL HIGH (ref 70–99)
Potassium: 3.9 mmol/L (ref 3.5–5.1)
Potassium: 5.5 mmol/L — ABNORMAL HIGH (ref 3.5–5.1)
Sodium: 138 mmol/L (ref 135–145)
Sodium: 137 mmol/L (ref 135–145)

## 2022-07-24 LAB — POCT I-STAT, CHEM 8
BUN: 20 mg/dL (ref 8–23)
BUN: 19 mg/dL (ref 8–23)
BUN: 17 mg/dL (ref 8–23)
BUN: 18 mg/dL (ref 8–23)
BUN: 18 mg/dL (ref 8–23)
BUN: 20 mg/dL (ref 8–23)
Calcium, Ion: 1.3 mmol/L (ref 1.15–1.40)
Calcium, Ion: 1.26 mmol/L (ref 1.15–1.40)
Calcium, Ion: 1.02 mmol/L — ABNORMAL LOW (ref 1.15–1.40)
Calcium, Ion: 1.14 mmol/L — ABNORMAL LOW (ref 1.15–1.40)
Calcium, Ion: 1.2 mmol/L (ref 1.15–1.40)
Calcium, Ion: 1.36 mmol/L (ref 1.15–1.40)
Chloride: 108 mmol/L (ref 98–111)
Chloride: 107 mmol/L (ref 98–111)
Chloride: 102 mmol/L (ref 98–111)
Chloride: 107 mmol/L (ref 98–111)
Chloride: 106 mmol/L (ref 98–111)
Chloride: 108 mmol/L (ref 98–111)
Creatinine, Ser: 1 mg/dL (ref 0.61–1.24)
Creatinine, Ser: 1 mg/dL (ref 0.61–1.24)
Creatinine, Ser: 0.8 mg/dL (ref 0.61–1.24)
Creatinine, Ser: 1 mg/dL (ref 0.61–1.24)
Creatinine, Ser: 1 mg/dL (ref 0.61–1.24)
Creatinine, Ser: 1 mg/dL (ref 0.61–1.24)
Glucose, Bld: 99 mg/dL (ref 70–99)
Glucose, Bld: 101 mg/dL — ABNORMAL HIGH (ref 70–99)
Glucose, Bld: 92 mg/dL (ref 70–99)
Glucose, Bld: 127 mg/dL — ABNORMAL HIGH (ref 70–99)
Glucose, Bld: 110 mg/dL — ABNORMAL HIGH (ref 70–99)
Glucose, Bld: 124 mg/dL — ABNORMAL HIGH (ref 70–99)
HCT: 34 % — ABNORMAL LOW (ref 39.0–52.0)
HCT: 33 % — ABNORMAL LOW (ref 39.0–52.0)
HCT: 27 % — ABNORMAL LOW (ref 39.0–52.0)
HCT: 27 % — ABNORMAL LOW (ref 39.0–52.0)
HCT: 26 % — ABNORMAL LOW (ref 39.0–52.0)
HCT: 27 % — ABNORMAL LOW (ref 39.0–52.0)
Hemoglobin: 11.6 g/dL — ABNORMAL LOW (ref 13.0–17.0)
Hemoglobin: 11.2 g/dL — ABNORMAL LOW (ref 13.0–17.0)
Hemoglobin: 9.2 g/dL — ABNORMAL LOW (ref 13.0–17.0)
Hemoglobin: 9.2 g/dL — ABNORMAL LOW (ref 13.0–17.0)
Hemoglobin: 8.8 g/dL — ABNORMAL LOW (ref 13.0–17.0)
Hemoglobin: 9.2 g/dL — ABNORMAL LOW (ref 13.0–17.0)
Potassium: 4.4 mmol/L (ref 3.5–5.1)
Potassium: 4.6 mmol/L (ref 3.5–5.1)
Potassium: 4.9 mmol/L (ref 3.5–5.1)
Potassium: 5.8 mmol/L — ABNORMAL HIGH (ref 3.5–5.1)
Potassium: 5.5 mmol/L — ABNORMAL HIGH (ref 3.5–5.1)
Potassium: 6.4 mmol/L (ref 3.5–5.1)
Sodium: 139 mmol/L (ref 135–145)
Sodium: 137 mmol/L (ref 135–145)
Sodium: 136 mmol/L (ref 135–145)
Sodium: 136 mmol/L (ref 135–145)
Sodium: 136 mmol/L (ref 135–145)
Sodium: 138 mmol/L (ref 135–145)
TCO2: 24 mmol/L (ref 22–32)
TCO2: 25 mmol/L (ref 22–32)
TCO2: 23 mmol/L (ref 22–32)
TCO2: 26 mmol/L (ref 22–32)
TCO2: 24 mmol/L (ref 22–32)
TCO2: 25 mmol/L (ref 22–32)

## 2022-07-24 LAB — ECHO INTRAOPERATIVE TEE
AV Mean grad: 4 mmHg
AV Peak grad: 7.4 mmHg
Ao pk vel: 1.36 m/s
Height: 69 in
Weight: 2617.3 oz

## 2022-07-24 LAB — CBC
HCT: 34.8 % — ABNORMAL LOW (ref 39.0–52.0)
HCT: 30.1 % — ABNORMAL LOW (ref 39.0–52.0)
HCT: 29 % — ABNORMAL LOW (ref 39.0–52.0)
Hemoglobin: 12.2 g/dL — ABNORMAL LOW (ref 13.0–17.0)
Hemoglobin: 10.2 g/dL — ABNORMAL LOW (ref 13.0–17.0)
Hemoglobin: 10.2 g/dL — ABNORMAL LOW (ref 13.0–17.0)
MCH: 31.3 pg (ref 26.0–34.0)
MCH: 31.4 pg (ref 26.0–34.0)
MCH: 32.2 pg (ref 26.0–34.0)
MCHC: 35.1 g/dL (ref 30.0–36.0)
MCHC: 33.9 g/dL (ref 30.0–36.0)
MCHC: 35.2 g/dL (ref 30.0–36.0)
MCV: 89.2 fL (ref 80.0–100.0)
MCV: 92.6 fL (ref 80.0–100.0)
MCV: 91.5 fL (ref 80.0–100.0)
Platelets: 254 10*3/uL (ref 150–400)
Platelets: 143 10*3/uL — ABNORMAL LOW (ref 150–400)
Platelets: 173 10*3/uL (ref 150–400)
RBC: 3.9 MIL/uL — ABNORMAL LOW (ref 4.22–5.81)
RBC: 3.25 MIL/uL — ABNORMAL LOW (ref 4.22–5.81)
RBC: 3.17 MIL/uL — ABNORMAL LOW (ref 4.22–5.81)
RDW: 12.6 % (ref 11.5–15.5)
RDW: 12.6 % (ref 11.5–15.5)
RDW: 12.6 % (ref 11.5–15.5)
WBC: 4.4 10*3/uL (ref 4.0–10.5)
WBC: 7.2 10*3/uL (ref 4.0–10.5)
WBC: 8.9 10*3/uL (ref 4.0–10.5)
nRBC: 0 % (ref 0.0–0.2)
nRBC: 0 % (ref 0.0–0.2)
nRBC: 0 % (ref 0.0–0.2)

## 2022-07-24 LAB — GLUCOSE, CAPILLARY
Glucose-Capillary: 94 mg/dL (ref 70–99)
Glucose-Capillary: 89 mg/dL (ref 70–99)
Glucose-Capillary: 105 mg/dL — ABNORMAL HIGH (ref 70–99)
Glucose-Capillary: 110 mg/dL — ABNORMAL HIGH (ref 70–99)
Glucose-Capillary: 101 mg/dL — ABNORMAL HIGH (ref 70–99)
Glucose-Capillary: 107 mg/dL — ABNORMAL HIGH (ref 70–99)
Glucose-Capillary: 98 mg/dL (ref 70–99)
Glucose-Capillary: 120 mg/dL — ABNORMAL HIGH (ref 70–99)
Glucose-Capillary: 146 mg/dL — ABNORMAL HIGH (ref 70–99)
Glucose-Capillary: 114 mg/dL — ABNORMAL HIGH (ref 70–99)

## 2022-07-24 LAB — HEMOGLOBIN AND HEMATOCRIT, BLOOD
HCT: 30.9 % — ABNORMAL LOW (ref 39.0–52.0)
Hemoglobin: 10.2 g/dL — ABNORMAL LOW (ref 13.0–17.0)

## 2022-07-24 LAB — APTT
aPTT: 57 seconds — ABNORMAL HIGH (ref 24–36)
aPTT: 37 seconds — ABNORMAL HIGH (ref 24–36)

## 2022-07-24 LAB — PROTIME-INR
INR: 1.4 — ABNORMAL HIGH (ref 0.8–1.2)
Prothrombin Time: 17.4 seconds — ABNORMAL HIGH (ref 11.4–15.2)

## 2022-07-24 LAB — PLATELET COUNT: Platelets: 191 10*3/uL (ref 150–400)

## 2022-07-24 LAB — COOXEMETRY PANEL
Carboxyhemoglobin: 1.6 % — ABNORMAL HIGH (ref 0.5–1.5)
Methemoglobin: <0.7 % (ref 0.0–1.5)
O2 Saturation: 67.9 %
Total hemoglobin: 10.7 g/dL — ABNORMAL LOW (ref 12.0–16.0)

## 2022-07-24 LAB — HEPARIN LEVEL (UNFRACTIONATED): Heparin Unfractionated: 0.2 IU/mL — ABNORMAL LOW (ref 0.30–0.70)

## 2022-07-24 SURGERY — CORONARY ARTERY BYPASS GRAFTING (CABG)
Anesthesia: General | Site: Chest | Laterality: Right

## 2022-07-24 MED ORDER — TRAMADOL HCL 50 MG PO TABS
50.0000 mg | ORAL_TABLET | ORAL | Status: DC | PRN
  Administered 2022-07-25 – 2022-07-28 (×4): 50 mg via ORAL
  Filled 2022-07-24 (×4): qty 1

## 2022-07-24 MED ORDER — FAMOTIDINE IN NACL 20-0.9 MG/50ML-% IV SOLN
20.0000 mg | Freq: Two times a day (BID) | INTRAVENOUS | Status: AC
  Administered 2022-07-24: 20 mg via INTRAVENOUS
  Filled 2022-07-24: qty 50

## 2022-07-24 MED ORDER — VANCOMYCIN HCL IN DEXTROSE 1-5 GM/200ML-% IV SOLN
1000.0000 mg | Freq: Once | INTRAVENOUS | Status: AC
  Administered 2022-07-24: 1000 mg via INTRAVENOUS
  Filled 2022-07-24: qty 200

## 2022-07-24 MED ORDER — CEFAZOLIN SODIUM-DEXTROSE 2-4 GM/100ML-% IV SOLN
2.0000 g | Freq: Three times a day (TID) | INTRAVENOUS | Status: AC
  Administered 2022-07-24 – 2022-07-26 (×6): 2 g via INTRAVENOUS
  Filled 2022-07-24 (×6): qty 100

## 2022-07-24 MED ORDER — SODIUM CHLORIDE 0.9% FLUSH
3.0000 mL | Freq: Two times a day (BID) | INTRAVENOUS | Status: DC
  Administered 2022-07-25 – 2022-07-27 (×6): 3 mL via INTRAVENOUS

## 2022-07-24 MED ORDER — LACTATED RINGERS IV SOLN: INTRAVENOUS | Status: DC | PRN

## 2022-07-24 MED ORDER — SODIUM CHLORIDE 0.9 % IV SOLN: INTRAVENOUS | Status: DC

## 2022-07-24 MED ORDER — ACETAMINOPHEN 160 MG/5ML PO SOLN: 650.0000 mg | Freq: Once | ORAL | Status: AC

## 2022-07-24 MED ORDER — ONDANSETRON HCL 4 MG/2ML IJ SOLN
INTRAMUSCULAR | Status: AC
  Filled 2022-07-24: qty 2

## 2022-07-24 MED ORDER — HEMOSTATIC AGENTS (NO CHARGE) OPTIME
TOPICAL | Status: DC | PRN
  Administered 2022-07-24: 1 via TOPICAL

## 2022-07-24 MED ORDER — OXYCODONE HCL 5 MG PO TABS
5.0000 mg | ORAL_TABLET | ORAL | Status: DC | PRN
  Administered 2022-07-25 – 2022-07-26 (×2): 5 mg via ORAL
  Filled 2022-07-24 (×3): qty 1

## 2022-07-24 MED ORDER — SODIUM CHLORIDE 0.9 % IV SOLN: 250.0000 mL | INTRAVENOUS | Status: DC

## 2022-07-24 MED ORDER — ASPIRIN 81 MG PO CHEW: 324.0000 mg | CHEWABLE_TABLET | Freq: Every day | ORAL | Status: DC

## 2022-07-24 MED ORDER — ROCURONIUM BROMIDE 10 MG/ML (PF) SYRINGE
PREFILLED_SYRINGE | INTRAVENOUS | Status: DC | PRN
  Administered 2022-07-24 (×2): 50 mg via INTRAVENOUS
  Administered 2022-07-24: 100 mg via INTRAVENOUS

## 2022-07-24 MED ORDER — MIDAZOLAM HCL (PF) 5 MG/ML IJ SOLN
INTRAMUSCULAR | Status: DC | PRN
  Administered 2022-07-24: 3 mg via INTRAVENOUS
  Administered 2022-07-24 (×2): 2 mg via INTRAVENOUS

## 2022-07-24 MED ORDER — METOPROLOL TARTRATE 12.5 MG HALF TABLET
12.5000 mg | ORAL_TABLET | Freq: Two times a day (BID) | ORAL | Status: DC
  Administered 2022-07-26: 12.5 mg via ORAL
  Filled 2022-07-24: qty 1

## 2022-07-24 MED ORDER — CHLORHEXIDINE GLUCONATE 0.12 % MT SOLN
15.0000 mL | OROMUCOSAL | Status: AC
  Administered 2022-07-24: 15 mL via OROMUCOSAL
  Filled 2022-07-24: qty 15

## 2022-07-24 MED ORDER — SODIUM CHLORIDE (PF) 0.9 % IJ SOLN: OROMUCOSAL | Status: DC | PRN

## 2022-07-24 MED ORDER — SODIUM CHLORIDE 0.45 % IV SOLN: INTRAVENOUS | Status: DC | PRN

## 2022-07-24 MED ORDER — ROCURONIUM BROMIDE 10 MG/ML (PF) SYRINGE
PREFILLED_SYRINGE | INTRAVENOUS | Status: AC
  Filled 2022-07-24: qty 10

## 2022-07-24 MED ORDER — HEPARIN SODIUM (PORCINE) 1000 UNIT/ML IJ SOLN
INTRAMUSCULAR | Status: AC
  Filled 2022-07-24: qty 1

## 2022-07-24 MED ORDER — LACTATED RINGERS IV SOLN: INTRAVENOUS | Status: DC

## 2022-07-24 MED ORDER — INSULIN REGULAR(HUMAN) IN NACL 100-0.9 UT/100ML-% IV SOLN: INTRAVENOUS | Status: DC

## 2022-07-24 MED ORDER — VASOPRESSIN 20 UNIT/ML IV SOLN
INTRAVENOUS | Status: AC
  Filled 2022-07-24: qty 1

## 2022-07-24 MED ORDER — ACETAMINOPHEN 650 MG RE SUPP
650.0000 mg | Freq: Once | RECTAL | Status: AC
  Administered 2022-07-24: 650 mg via RECTAL

## 2022-07-24 MED ORDER — LACTATED RINGERS IV SOLN
500.0000 mL | Freq: Once | INTRAVENOUS | Status: AC | PRN
  Administered 2022-07-25: 500 mL via INTRAVENOUS

## 2022-07-24 MED ORDER — ACETAMINOPHEN 500 MG PO TABS
1000.0000 mg | ORAL_TABLET | Freq: Four times a day (QID) | ORAL | Status: AC
  Administered 2022-07-24 – 2022-07-29 (×17): 1000 mg via ORAL
  Filled 2022-07-24 (×19): qty 2

## 2022-07-24 MED ORDER — 0.9 % SODIUM CHLORIDE (POUR BTL) OPTIME
TOPICAL | Status: DC | PRN
  Administered 2022-07-24: 5000 mL

## 2022-07-24 MED ORDER — ALBUMIN HUMAN 5 % IV SOLN
250.0000 mL | INTRAVENOUS | Status: AC | PRN
  Administered 2022-07-24 (×4): 12.5 g via INTRAVENOUS
  Filled 2022-07-24 (×2): qty 250

## 2022-07-24 MED ORDER — SODIUM CHLORIDE 0.9 % IV SOLN: INTRAVENOUS | Status: DC | PRN

## 2022-07-24 MED ORDER — PROPOFOL 10 MG/ML IV BOLUS
INTRAVENOUS | Status: DC | PRN
  Administered 2022-07-24: 50 mg via INTRAVENOUS

## 2022-07-24 MED ORDER — PROTAMINE SULFATE 10 MG/ML IV SOLN
INTRAVENOUS | Status: AC
  Filled 2022-07-24: qty 25

## 2022-07-24 MED ORDER — ONDANSETRON HCL 4 MG/2ML IJ SOLN
4.0000 mg | Freq: Four times a day (QID) | INTRAMUSCULAR | Status: DC | PRN
  Administered 2022-07-25 – 2022-07-26 (×4): 4 mg via INTRAVENOUS
  Filled 2022-07-24 (×4): qty 2

## 2022-07-24 MED ORDER — EPHEDRINE SULFATE-NACL 50-0.9 MG/10ML-% IV SOSY
PREFILLED_SYRINGE | INTRAVENOUS | Status: DC | PRN
  Administered 2022-07-24: 10 mg via INTRAVENOUS

## 2022-07-24 MED ORDER — NITROGLYCERIN IN D5W 200-5 MCG/ML-% IV SOLN: 0.0000 ug/min | INTRAVENOUS | Status: DC

## 2022-07-24 MED ORDER — MIDAZOLAM HCL (PF) 10 MG/2ML IJ SOLN
INTRAMUSCULAR | Status: AC
  Filled 2022-07-24: qty 2

## 2022-07-24 MED ORDER — FENTANYL CITRATE (PF) 250 MCG/5ML IJ SOLN
INTRAMUSCULAR | Status: AC
  Filled 2022-07-24: qty 5

## 2022-07-24 MED ORDER — ALBUMIN HUMAN 5 % IV SOLN: INTRAVENOUS | Status: DC | PRN

## 2022-07-24 MED ORDER — ACETAMINOPHEN 160 MG/5ML PO SOLN: 1000.0000 mg | Freq: Four times a day (QID) | ORAL | Status: AC

## 2022-07-24 MED ORDER — CALCIUM CHLORIDE 10 % IV SOLN
1.0000 g | Freq: Once | INTRAVENOUS | Status: AC
  Administered 2022-07-24: 1 g via INTRAVENOUS

## 2022-07-24 MED ORDER — CHLORHEXIDINE GLUCONATE CLOTH 2 % EX PADS
6.0000 | MEDICATED_PAD | Freq: Every day | CUTANEOUS | Status: DC
  Administered 2022-07-24 – 2022-07-27 (×4): 6 via TOPICAL

## 2022-07-24 MED ORDER — METOPROLOL TARTRATE 5 MG/5ML IV SOLN: 2.5000 mg | INTRAVENOUS | Status: DC | PRN

## 2022-07-24 MED ORDER — MIDAZOLAM HCL 2 MG/2ML IJ SOLN: 2.0000 mg | INTRAMUSCULAR | Status: DC | PRN

## 2022-07-24 MED ORDER — DEXMEDETOMIDINE HCL IN NACL 400 MCG/100ML IV SOLN: 0.0000 ug/kg/h | INTRAVENOUS | Status: DC

## 2022-07-24 MED ORDER — PANTOPRAZOLE SODIUM 40 MG PO TBEC
40.0000 mg | DELAYED_RELEASE_TABLET | Freq: Every day | ORAL | Status: DC
  Administered 2022-07-26 – 2022-08-01 (×7): 40 mg via ORAL
  Filled 2022-07-24 (×7): qty 1

## 2022-07-24 MED ORDER — POTASSIUM CHLORIDE 10 MEQ/50ML IV SOLN: 10.0000 meq | INTRAVENOUS | Status: AC

## 2022-07-24 MED ORDER — PROPOFOL 10 MG/ML IV BOLUS
INTRAVENOUS | Status: AC
  Filled 2022-07-24: qty 20

## 2022-07-24 MED ORDER — METOCLOPRAMIDE HCL 5 MG/ML IJ SOLN
10.0000 mg | Freq: Four times a day (QID) | INTRAMUSCULAR | Status: AC
  Administered 2022-07-24 – 2022-07-25 (×4): 10 mg via INTRAVENOUS
  Filled 2022-07-24 (×4): qty 2

## 2022-07-24 MED ORDER — HEPARIN SODIUM (PORCINE) 1000 UNIT/ML IJ SOLN
INTRAMUSCULAR | Status: DC | PRN
  Administered 2022-07-24: 24000 [IU] via INTRAVENOUS
  Administered 2022-07-24: 2000 [IU] via INTRAVENOUS

## 2022-07-24 MED ORDER — DOCUSATE SODIUM 100 MG PO CAPS
200.0000 mg | ORAL_CAPSULE | Freq: Every day | ORAL | Status: DC
  Administered 2022-07-25 – 2022-08-01 (×8): 200 mg via ORAL
  Filled 2022-07-24 (×9): qty 2

## 2022-07-24 MED ORDER — BISACODYL 5 MG PO TBEC
10.0000 mg | DELAYED_RELEASE_TABLET | Freq: Every day | ORAL | Status: DC
  Administered 2022-07-25 – 2022-07-27 (×3): 10 mg via ORAL
  Filled 2022-07-24 (×4): qty 2

## 2022-07-24 MED ORDER — EPHEDRINE 5 MG/ML INJ
INTRAVENOUS | Status: AC
  Filled 2022-07-24: qty 5

## 2022-07-24 MED ORDER — METOPROLOL TARTRATE 25 MG/10 ML ORAL SUSPENSION: 12.5000 mg | Freq: Two times a day (BID) | ORAL | Status: DC

## 2022-07-24 MED ORDER — DEXTROSE 50 % IV SOLN: 0.0000 mL | INTRAVENOUS | Status: DC | PRN

## 2022-07-24 MED ORDER — MAGNESIUM SULFATE 4 GM/100ML IV SOLN
4.0000 g | Freq: Once | INTRAVENOUS | Status: AC
  Administered 2022-07-24: 4 g via INTRAVENOUS
  Filled 2022-07-24: qty 100

## 2022-07-24 MED ORDER — ASPIRIN 325 MG PO TBEC
325.0000 mg | DELAYED_RELEASE_TABLET | Freq: Every day | ORAL | Status: DC
  Administered 2022-07-25: 325 mg via ORAL
  Filled 2022-07-24: qty 1

## 2022-07-24 MED ORDER — PHENYLEPHRINE HCL-NACL 20-0.9 MG/250ML-% IV SOLN
0.0000 ug/min | INTRAVENOUS | Status: DC
  Administered 2022-07-24 – 2022-07-25 (×2): 50 ug/min via INTRAVENOUS
  Administered 2022-07-25: 80 ug/min via INTRAVENOUS
  Administered 2022-07-25: 70 ug/min via INTRAVENOUS
  Administered 2022-07-25: 40 ug/min via INTRAVENOUS
  Administered 2022-07-25: 100 ug/min via INTRAVENOUS
  Filled 2022-07-24 (×6): qty 250

## 2022-07-24 MED ORDER — PROTAMINE SULFATE 10 MG/ML IV SOLN
INTRAVENOUS | Status: DC | PRN
  Administered 2022-07-24: 260 mg via INTRAVENOUS

## 2022-07-24 MED ORDER — MORPHINE SULFATE (PF) 2 MG/ML IV SOLN
1.0000 mg | INTRAVENOUS | Status: DC | PRN
  Administered 2022-07-24: 2 mg via INTRAVENOUS
  Administered 2022-07-25: 4 mg via INTRAVENOUS
  Administered 2022-07-25: 2 mg via INTRAVENOUS
  Administered 2022-07-25 (×2): 4 mg via INTRAVENOUS
  Filled 2022-07-24 (×2): qty 2
  Filled 2022-07-24: qty 1
  Filled 2022-07-24: qty 2
  Filled 2022-07-24: qty 1

## 2022-07-24 MED ORDER — PLASMA-LYTE A IV SOLN: INTRAVENOUS | Status: DC | PRN

## 2022-07-24 MED ORDER — FENTANYL CITRATE (PF) 250 MCG/5ML IJ SOLN
INTRAMUSCULAR | Status: DC | PRN
  Administered 2022-07-24: 50 ug via INTRAVENOUS
  Administered 2022-07-24: 200 ug via INTRAVENOUS
  Administered 2022-07-24: 250 ug via INTRAVENOUS
  Administered 2022-07-24 (×2): 100 ug via INTRAVENOUS

## 2022-07-24 MED ORDER — BISACODYL 10 MG RE SUPP: 10.0000 mg | Freq: Every day | RECTAL | Status: DC

## 2022-07-24 MED ORDER — SODIUM CHLORIDE 0.9% FLUSH: 3.0000 mL | INTRAVENOUS | Status: DC | PRN

## 2022-07-24 SURGICAL SUPPLY — 102 items
ADH SKN CLS APL DERMABOND .7 (GAUZE/BANDAGES/DRESSINGS) ×4
ANTIFOG SOL W/FOAM PAD STRL (MISCELLANEOUS) ×4
ATRICLIP EXCLUSION VLAA 45 (Miscellaneous) ×4 IMPLANT
BAG DECANTER FOR FLEXI CONT (MISCELLANEOUS) ×4 IMPLANT
BLADE CLIPPER SURG (BLADE) ×4 IMPLANT
BLADE STERNUM SYSTEM 6 (BLADE) ×4 IMPLANT
BLADE SURG 11 STRL SS (BLADE) IMPLANT
BNDG CMPR 5X4 KNIT ELC UNQ LF (GAUZE/BANDAGES/DRESSINGS) ×4
BNDG CMPR 5X62 HK CLSR LF (GAUZE/BANDAGES/DRESSINGS) ×4
BNDG ELASTIC 4INX 5YD STR LF (GAUZE/BANDAGES/DRESSINGS) IMPLANT
BNDG ELASTIC 4X5.8 VLCR STR LF (GAUZE/BANDAGES/DRESSINGS) ×4 IMPLANT
BNDG ELASTIC 6INX 5YD STR LF (GAUZE/BANDAGES/DRESSINGS) IMPLANT
BNDG ELASTIC 6X5.8 VLCR STR LF (GAUZE/BANDAGES/DRESSINGS) ×4 IMPLANT
BNDG GAUZE DERMACEA FLUFF 4 (GAUZE/BANDAGES/DRESSINGS) ×4 IMPLANT
BNDG GZE DERMACEA 4 6PLY (GAUZE/BANDAGES/DRESSINGS) ×4
CANISTER SUCT 3000ML PPV (MISCELLANEOUS) ×4 IMPLANT
CANNULA EZ GLIDE AORTIC 21FR (CANNULA) ×4 IMPLANT
CANNULA MC2 2 STG 36/46 NON-V (CANNULA) IMPLANT
CATH CPB KIT HENDRICKSON (MISCELLANEOUS) ×4 IMPLANT
CATH ROBINSON RED A/P 18FR (CATHETERS) ×4 IMPLANT
CATH THORACIC 36FR (CATHETERS) ×4 IMPLANT
CATH THORACIC 36FR RT ANG (CATHETERS) ×4 IMPLANT
CLIP FOGARTY SPRING 6M (CLIP) IMPLANT
CLIP TI MEDIUM 24 (CLIP) IMPLANT
CLIP TI WIDE RED SMALL 24 (CLIP) IMPLANT
CNTNR URN SCR LID CUP LEK RST (MISCELLANEOUS) IMPLANT
CONT SPEC 4OZ STRL OR WHT (MISCELLANEOUS) ×4
CONTAINER PROTECT SURGISLUSH (MISCELLANEOUS) ×8 IMPLANT
DERMABOND ADVANCED .7 DNX12 (GAUZE/BANDAGES/DRESSINGS) IMPLANT
DEVICE EXCLUSIN ATRCLP VLAA 45 (Miscellaneous) IMPLANT
DRAPE CARDIOVASCULAR INCISE (DRAPES) ×4
DRAPE SRG 135X102X78XABS (DRAPES) ×4 IMPLANT
DRAPE WARM FLUID 44X44 (DRAPES) ×4 IMPLANT
DRSG COVADERM 4X14 (GAUZE/BANDAGES/DRESSINGS) ×4 IMPLANT
ELECT REM PT RETURN 9FT ADLT (ELECTROSURGICAL) ×8
ELECTRODE REM PT RTRN 9FT ADLT (ELECTROSURGICAL) ×8 IMPLANT
FELT TEFLON 1X6 (MISCELLANEOUS) ×8 IMPLANT
GAUZE 4X4 16PLY ~~LOC~~+RFID DBL (SPONGE) ×4 IMPLANT
GAUZE SPONGE 4X4 12PLY STRL (GAUZE/BANDAGES/DRESSINGS) ×8 IMPLANT
GAUZE SPONGE 4X4 12PLY STRL LF (GAUZE/BANDAGES/DRESSINGS) IMPLANT
GLOVE BIOGEL PI IND STRL 6.5 (GLOVE) IMPLANT
GLOVE SS BIOGEL STRL SZ 6 (GLOVE) IMPLANT
GLOVE SS BIOGEL STRL SZ 7.5 (GLOVE) ×4 IMPLANT
GLOVE SURG SIGNA 7.5 PF LTX (GLOVE) ×12 IMPLANT
GOWN STRL REUS W/ TWL LRG LVL3 (GOWN DISPOSABLE) ×16 IMPLANT
GOWN STRL REUS W/ TWL XL LVL3 (GOWN DISPOSABLE) ×8 IMPLANT
GOWN STRL REUS W/TWL LRG LVL3 (GOWN DISPOSABLE) ×28
GOWN STRL REUS W/TWL XL LVL3 (GOWN DISPOSABLE) ×8
HEMOSTAT POWDER SURGIFOAM 1G (HEMOSTASIS) ×12 IMPLANT
HEMOSTAT SURGICEL 2X14 (HEMOSTASIS) ×4 IMPLANT
INSERT FOGARTY XLG (MISCELLANEOUS) IMPLANT
KIT BASIN OR (CUSTOM PROCEDURE TRAY) ×4 IMPLANT
KIT SUCTION CATH 14FR (SUCTIONS) ×8 IMPLANT
KIT TURNOVER KIT B (KITS) ×4 IMPLANT
KIT VASOVIEW HEMOPRO 2 VH 4000 (KITS) ×4 IMPLANT
MARKER GRAFT CORONARY BYPASS (MISCELLANEOUS) ×12 IMPLANT
NS IRRIG 1000ML POUR BTL (IV SOLUTION) ×20 IMPLANT
PACK E OPEN HEART (SUTURE) ×4 IMPLANT
PACK OPEN HEART (CUSTOM PROCEDURE TRAY) ×4 IMPLANT
PAD ARMBOARD 7.5X6 YLW CONV (MISCELLANEOUS) ×8 IMPLANT
PAD ELECT DEFIB RADIOL ZOLL (MISCELLANEOUS) ×4 IMPLANT
PENCIL BUTTON HOLSTER BLD 10FT (ELECTRODE) ×4 IMPLANT
POSITIONER HEAD DONUT 9IN (MISCELLANEOUS) ×4 IMPLANT
PUNCH AORTIC ROTATE 4.0MM (MISCELLANEOUS) IMPLANT
PUNCH AORTIC ROTATE 4.5MM 8IN (MISCELLANEOUS) IMPLANT
PUNCH AORTIC ROTATE 5MM 8IN (MISCELLANEOUS) IMPLANT
SET MPS 3-ND DEL (MISCELLANEOUS) IMPLANT
SOLUTION ANTFG W/FOAM PAD STRL (MISCELLANEOUS) IMPLANT
SPONGE T-LAP 18X18 ~~LOC~~+RFID (SPONGE) ×16 IMPLANT
SPONGE T-LAP 4X18 ~~LOC~~+RFID (SPONGE) ×4 IMPLANT
SUPPORT HEART JANKE-BARRON (MISCELLANEOUS) ×4 IMPLANT
SUT BONE WAX W31G (SUTURE) ×4 IMPLANT
SUT MNCRL AB 4-0 PS2 18 (SUTURE) IMPLANT
SUT PROLENE 3 0 SH DA (SUTURE) ×4 IMPLANT
SUT PROLENE 4 0 RB 1 (SUTURE) ×12
SUT PROLENE 4 0 SH DA (SUTURE) IMPLANT
SUT PROLENE 4-0 RB1 .5 CRCL 36 (SUTURE) IMPLANT
SUT PROLENE 6 0 C 1 30 (SUTURE) ×8 IMPLANT
SUT PROLENE 7 0 BV 1 (SUTURE) IMPLANT
SUT PROLENE 7 0 BV1 MDA (SUTURE) ×4 IMPLANT
SUT PROLENE 8 0 BV175 6 (SUTURE) IMPLANT
SUT STEEL 6MS V (SUTURE) ×4 IMPLANT
SUT STEEL STERNAL CCS#1 18IN (SUTURE) IMPLANT
SUT STEEL SZ 6 DBL 3X14 BALL (SUTURE) ×4 IMPLANT
SUT VIC AB 1 CTX 36 (SUTURE) ×8
SUT VIC AB 1 CTX36XBRD ANBCTR (SUTURE) ×8 IMPLANT
SUT VIC AB 2-0 CT1 27 (SUTURE) ×4
SUT VIC AB 2-0 CT1 TAPERPNT 27 (SUTURE) IMPLANT
SUT VIC AB 2-0 CTX 27 (SUTURE) IMPLANT
SUT VIC AB 3-0 SH 27 (SUTURE)
SUT VIC AB 3-0 SH 27X BRD (SUTURE) IMPLANT
SUT VIC AB 3-0 X1 27 (SUTURE) IMPLANT
SUT VICRYL 4-0 PS2 18IN ABS (SUTURE) IMPLANT
SYSTEM SAHARA CHEST DRAIN ATS (WOUND CARE) ×4 IMPLANT
TAPE CLOTH SURG 4X10 WHT LF (GAUZE/BANDAGES/DRESSINGS) IMPLANT
TAPE PAPER 2X10 WHT MICROPORE (GAUZE/BANDAGES/DRESSINGS) IMPLANT
TOWEL GREEN STERILE (TOWEL DISPOSABLE) ×4 IMPLANT
TOWEL GREEN STERILE FF (TOWEL DISPOSABLE) ×4 IMPLANT
TRAY FOLEY SLVR 16FR TEMP STAT (SET/KITS/TRAYS/PACK) ×4 IMPLANT
TUBING LAP HI FLOW INSUFFLATIO (TUBING) ×4 IMPLANT
UNDERPAD 30X36 HEAVY ABSORB (UNDERPADS AND DIAPERS) ×4 IMPLANT
WATER STERILE IRR 1000ML POUR (IV SOLUTION) ×8 IMPLANT

## 2022-07-24 NOTE — Brief Op Note (Addendum)
07/20/2022 - 07/24/2022  5:01 PM  PATIENT:  Walter George  77 y.o. male  PRE-OPERATIVE DIAGNOSIS:  CAD, Atrial fibrillation s/p ablation  POST-OPERATIVE DIAGNOSIS:  CAD, Atrial fibrillation s/p ablation  PROCEDURE:  Procedure(s):  CORONARY ARTERY BYPASS GRAFTING x 5 -LIMA to LAD -SVG to DISTAL RCA with Endarterectomy -SVG to DIAGONAL -SEQ SVG to RAMUS and OM  CLIPPING OF ATRIAL APPENDAGE USING ATRICURE FLEX-V ATRICLIP   TRANSESOPHAGEAL ECHOCARDIOGRAM (N/A)  ENDOSCOPIC HARVEST GREATER SAPHENOUS VEIN -Right Leg Vein harvest time: 33 min Vein prep time: 15 min  SURGEON:  Surgeon(s) and Role:    [name], MD - Primary  PHYSICIAN ASSISTANT: Lowella Dandy PA-C  ASSISTANTS: Virgilio Frees RNFA    ANESTHESIA:   general  EBL:  550 mL   BLOOD ADMINISTERED:   CC CELLSAVER  DRAINS:  Right and left pleural chest tubes, mediastinal chest drains    LOCAL MEDICATIONS USED:  NONE  SPECIMEN:  No Specimen  DISPOSITION OF SPECIMEN:  N/A  COUNTS:  YES  TOURNIQUET:  * No tourniquets in log *  DICTATION: .Dragon Dictation  PLAN OF CARE: Admit to inpatient   PATIENT DISPOSITION:  PACU - hemodynamically stable.   Delay start of Pharmacological VTE agent (>24hrs) due to surgical blood loss or risk of bleeding: yes

## 2022-07-24 NOTE — Progress Notes (Signed)
Extubation Procedure Note  Patient Details:   Name: Walter George DOB: 04/16/1945 MRN: [id number]   Airway Documentation:    Vent end date: (not recorded) Vent end time: (not recorded)   Evaluation  O2 sats: stable throughout Complications: No apparent complications Patient did tolerate procedure well. Bilateral Breath Sounds: Clear   Yes  [name] 07/24/2022, 8:34 PM Extubated pt. After successful rapid wean protocol, pt. Abg within normal limits VC 1.3 L NIF- 40 following commands oriented strong cough after extubation pt. Was able to reach 1000 on IS with great effort RN bedside

## 2022-07-24 NOTE — Progress Notes (Addendum)
Patient ID: PHI Walter George, male   DOB: 09/21/1945, 77 y.o.   MRN: [id number]     Advanced Heart Failure Rounding Note  PCP-Cardiologist: None   Subjective:    Seen in ICU this afternoon post CABG.  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   Weight Range: 74.2 kg Body mass index is 24.16 kg/m.   Vital Signs:   Temp:  [96.6 F (35.9 C)-98.2 F (36.8 C)] 97.3 F (36.3 C) (05/08 1600) Pulse Rate:  [43-80] 80 (05/08 1515) Resp:  [12-26] 16 (05/08 1600) BP: (96-125)/(66-75) 96/66 (05/08 0800) SpO2:  [95 %-100 %] 100 % (05/08 1515) Arterial Line BP: (78-124)/(54-79) 107/70 (05/08 1600) FiO2 (%):  [50 %] 50 % (05/08 1415) Last BM Date : 07/22/22  Weight change: Filed Weights   07/21/22 0055  Weight: 74.2 kg   Intake/Output:   Intake/Output Summary (Last 24 hours) at 07/24/2022 1638 Last data filed at 07/24/2022 1600 Gross per 24 hour  Intake 3636.12 ml  Output 1865 ml  Net 1771.12 ml    Physical Exam  General:  Sedated on vent HEENT: + ETT Neck: supple. no JVD. R IJ SWAN. Carotids 2+ bilat; no bruits.  Cor: PMI nondisplaced. Regular rate & rhythm. No rubs, gallops or murmurs. Dressing over sternum. + CT. Lungs: clear Abdomen: soft, nontender, nondistended.  Extremities: no cyanosis, clubbing, rash, edema Neuro: Sedated on vent   Telemetry   AV paced 80  Labs    CBC Recent Labs    07/24/22 0537 07/24/22 0902 07/24/22 1223 07/24/22 1233 07/24/22 1319 07/24/22 1425  WBC 4.4  --   --   --   --  7.2  HGB 12.2*   < > 10.2*   < > 8.8* 10.2*  HCT 34.8*   < > 30.9*   < > 26.0* 30.1*  MCV 89.2  --   --   --   --  92.6  PLT 254  --  191  --   --  143*   < > = values in this interval not displayed.   Basic Metabolic Panel Recent Labs    40/98/11 0321 07/24/22 0537 07/24/22 0902 07/24/22 1233  07/24/22 1316 07/24/22 1319  NA 134* 138   < > 136 138 138  K 4.1 3.9   < > 6.4* 5.6* 5.5*  CL 106 108   < > 106  --  108  CO2 20* 21*  --   --   --   --   GLUCOSE 92 93   < > 124*  --  110*  BUN 22 20   < > 20  --  18  CREATININE 1.36* 1.16   < > 1.00  --  1.00  CALCIUM 8.5* 8.5*  --   --   --   --    < > = values in this interval not displayed.   Liver Function Tests No results for input(s): "AST", "ALT", "ALKPHOS", "BILITOT", "PROT", "ALBUMIN" in the last 72 hours. No results for input(s): "LIPASE", "AMYLASE" in the last 72 hours. Cardiac Enzymes No results for input(s): "CKTOTAL", "CKMB", "CKMBINDEX", "TROPONINI" in the last 72 hours.  BNP: BNP (last 3 results) Recent Labs    02/27/22 1651  BNP 68.6    ProBNP (last 3 results) No results for  input(s): "PROBNP" in the last 8760 hours.   D-Dimer No results for input(s): "DDIMER" in the last 72 hours. Hemoglobin A1C Recent Labs    07/23/22 0321  HGBA1C 5.6   Fasting Lipid Panel Recent Labs    07/23/22 0321  CHOL 91  HDL 46  LDLCALC 36  TRIG 47  CHOLHDL 2.0   Thyroid Function Tests No results for input(s): "TSH", "T4TOTAL", "T3FREE", "THYROIDAB" in the last 72 hours.  Invalid input(s): "FREET3"  Other results:   Imaging    DG Chest Port 1 View  Result Date: 07/24/2022 CLINICAL DATA:  Post CABG EXAM: PORTABLE CHEST 1 VIEW COMPARISON:  07/23/2022 FINDINGS: Endotracheal tube tip is about 6 cm superior to the carina. Esophageal tube tip below the diaphragm but incompletely visualized. Right IJ Swan-Ganz catheter is looped in the region of central pulmonary arteries with the tip directed cranial and to the patient's left, likely in the region of left upper lobe artery. Interval sternotomy and left atrial appendage clip. Placement of mediastinal and chest drainage catheters. Minimal atelectasis left lung base. Normal cardiomediastinal silhouette. Possible tiny left effusion. Possible trace right apical  pneumothorax. IMPRESSION: 1. Endotracheal tube tip about 6 cm superior to carina. 2. Right IJ Swan-Ganz catheter is looped in the region of central pulmonary artery with the tip directed cranial and to the patient's left, likely in the region of left upper lobe artery. 3. Possible trace right apical pneumothorax. 4. Mild left basilar atelectasis and trace left pleural effusion. These results will be called to the ordering clinician or representative by the Radiologist Assistant, and communication documented in the PACS or [employer]. Electronically Signed   By: Jasmine Pang M.D.   On: 07/24/2022 15:13   ECHO INTRAOPERATIVE TEE  Result Date: 07/24/2022  *INTRAOPERATIVE TRANSESOPHAGEAL REPORT *  Patient Name:   Walter George Date of Exam: 07/24/2022 Medical Rec #:  [id number]           Height:       69.0 in Accession #:    [id number]          Weight:       163.6 lb Date of Birth:  October 23, 1945          BSA:          1.90 m Patient Age:    76 years            BP:           102/69 mmHg Patient Gender: M                   HR:           46 bpm. Exam Location:  Inpatient Transesophogeal exam was perform intraoperatively during surgical procedure. Patient was closely monitored under general anesthesia during the entirety of examination. Indications:     coronary artery bypass surgery Sonographer:     Delcie Roch RDCS Performing Phys: [id number] HENDRICKSON Diagnosing Phys: Gaynelle Adu MD Complications: No known complications during this procedure. POST-OP IMPRESSIONS Overall, there were no significant changes from pre-bypass. PRE-OP FINDINGS  Left Ventricle: The left ventricle has normal systolic function, with an ejection fraction of 55-60%. The cavity size was normal. There is no increase in left ventricular wall thickness. No evidence of left ventricular regional wall motion abnormalities. There is no left ventricular hypertrophy. Right Ventricle: The right ventricle has normal systolic  function. The cavity was normal. There is no increase in right ventricular wall thickness. Left  Atrium: Left atrial size was not assessed. No left atrial/left atrial appendage thrombus was detected. Right Atrium: Right atrial size was not assessed. Interatrial Septum: Evidence of atrial level shunting detected by color flow Doppler. Pericardium: There is no evidence of pericardial effusion. Mitral Valve: The mitral valve is normal in structure. Mitral valve regurgitation is trivial by color flow Doppler. Tricuspid Valve: The tricuspid valve was normal in structure. Tricuspid valve regurgitation was not visualized by color flow Doppler. Aortic Valve: The aortic valve is tricuspid Aortic valve regurgitation was not visualized by color flow Doppler. There is no stenosis of the aortic valve. There is moderate thickening present with mildly decreased mobility. Pulmonic Valve: The pulmonic valve was normal in structure. Pulmonic valve regurgitation is not visualized by color flow Doppler. 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 +--------------+--------++ LEFT VENTRICLE         +--------------+--------++ PLAX 2D                +--------------+--------++ LVOT diam:    2.30 cm  +--------------+--------++ LVOT Area:    4.15 cm +--------------+--------++                        +--------------+--------++ +-------------+-----------++ AORTIC VALVE             +-------------+-----------++ AV Vmax:     136.00 cm/s +-------------+-----------++ AV Vmean:    90.900 cm/s +-------------+-----------++ AV VTI:      0.324 m     +-------------+-----------++ AV Peak Grad:7.4 mmHg    +-------------+-----------++ AV Mean Grad:4.0 mmHg    +-------------+-----------++  +--------------+-------+ SHUNTS                +--------------+-------+ Systemic Diam:2.30 cm +--------------+-------+  Gaynelle Adu MD Electronically  signed by Gaynelle Adu MD Signature Date/Time: 07/24/2022/2:46:08 PM    Final    EP STUDY  Result Date: 07/24/2022 See surgical note for result.    Medications:     Scheduled Medications:  [START ON 07/25/2022] acetaminophen  1,000 mg Oral Q6H   Or   [START ON 07/25/2022] acetaminophen (TYLENOL) oral liquid 160 mg/5 mL  1,000 mg Per Tube Q6H   amiodarone  200 mg Oral BID   [START ON 07/25/2022] aspirin EC  325 mg Oral Daily   Or   [START ON 07/25/2022] aspirin  324 mg Per Tube Daily   atorvastatin  80 mg Oral Daily   [START ON 07/25/2022] bisacodyl  10 mg Oral Daily   Or   [START ON 07/25/2022] bisacodyl  10 mg Rectal Daily   Chlorhexidine Gluconate Cloth  6 each Topical Daily   [START ON 07/25/2022] docusate sodium  200 mg Oral Daily   ezetimibe  10 mg Oral Daily   metoCLOPramide (REGLAN) injection  10 mg Intravenous Q6H   metoprolol tartrate  12.5 mg Oral BID   Or   metoprolol tartrate  12.5 mg Per Tube BID   [START ON 07/26/2022] pantoprazole  40 mg Oral Daily   [START ON 07/25/2022] sodium chloride flush  3 mL Intravenous Q12H    Infusions:  sodium chloride     [START ON 07/25/2022] sodium chloride     sodium chloride 20 mL/hr at 07/24/22 1600   albumin human 12.5 g (07/24/22 1612)    ceFAZolin (ANCEF) IV Stopped (07/24/22 1553)   dexmedetomidine (PRECEDEX) IV infusion 0.7 mcg/kg/hr (07/24/22 1600)   famotidine (PEPCID) IV Stopped (07/24/22 1506)   insulin  Stopped (07/24/22 1500)   lactated ringers     lactated ringers     lactated ringers 20 mL/hr at 07/24/22 1425   magnesium sulfate 20 mL/hr at 07/24/22 1600   nitroGLYCERIN     phenylephrine (NEO-SYNEPHRINE) Adult infusion 20 mcg/min (07/24/22 1600)   potassium chloride     vancomycin      PRN Medications: sodium chloride, albumin human, dextrose, lactated ringers, metoprolol tartrate, midazolam, morphine injection, ondansetron (ZOFRAN) IV, oxyCODONE, [START ON 07/25/2022] sodium chloride flush, traMADol   Assessment/Plan   1. CAD:  On 5/3, he developed pleuritic chest pain and came to the ER.  HS-TnI 1400=>1065.  Cath was done, showing severe 3 vessel disease with 95% mRCA, 90% ostial LCx, 99% ramus, 100% D1 with collaterals, 70% prox-mid LAD with abnormal RFR.  No vessel looked like acute plaque rupture.  Discussed with Dr. Lalla Brothers, suspect the pleuritic chest pain and TnI elevation were due to the AF ablation and not ACS.   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  - S/p CABG X 5 05/08. - Continue aspirin and statin.  - Currently on 40 Neo with stable MAP. If requiring pressor for longer duration would consider NE.  - PA catheter looped in RV on CXR. Dr. Gasper Lloyd will reposition this afternoon. - Monitor CVP and CO-OX  2. Atrial fibrillation: Paroxysmal, s/p ablation on 5/2.  He is in NSR now.  - Left atrial appendage clipped at time of CABG - Risk for recurrence of AF perioperatively, Continue amiodarone 200 mg bid and continue until a month or so post-op if he remains in NSR.   - Anticoagulation when okay with TCTS  3. Hyperkalemia - K up to 6.4>>5.5  - Primary team correcting  Length of Stay: 3  Aslyn Cottman N, PA-C  07/24/2022, 4:38 PM  Advanced Heart Failure Team Pager [phone] (M-F; 7a - 5p)  Please contact [facility] for night-coverage after hours (5p -7a ) and weekends on [email or web address]

## 2022-07-24 NOTE — Hospital Course (Addendum)
History of Present Illness:  Walter George is a 77 yo male with history of Atrial Fibrillation, HLD, CHF, and coronary calcification.  His atrial Fibrillation was first diagnosed in November of 2023, requiring cardioversion in January of 2024.  He is physically active, but would experience dyspnea when ambulating up inclines.  He had a repeat ablation 07/19/2022.  He presented back to the ED the following evening with complaints of substernal chest pain.  He was also short of breath.  Workup showed elevated troponin levels.  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  He was admitted for coronary bypass grafting consult.  Hospital Course:  He was initially evaluated by Dr. Cliffton Asters who felt the patient would be a surgical candidate.  However due to scheduling in the operating room the patient was set up with Dr. Dorris Fetch.  He evaluated the patient and was in agreement the patient would be a good surgical candidate.  The risks and benefits of the procedure were explained to the patient and he was agreeable to proceed.  He was taken to the operating room on 07/24/2022.  He underwent CABG x 5 utilizing LIMA to LAD, SVG to Distal RCA, SVG to Diagonal, and Sequential SVG to Ramus and OM.  He underwent Clipping of his Left Atrial Appendage with an Atricure Pro V2 clip and endoscopic harvest of greater saphenous vein from his right leg.  He tolerated the procedure without difficulty and was taken to the SICU in stable condition.  He remained hemodynamically stable and was weaned from the mechanical ventilator for extubation by 8pm on the day of surgery. Chest tubes and monitoring lines were removed on post-op day 1 and [name] was mobilized in the ICU. He was started on Midodrine. EPW were removed on 05/11. He had AKI post op. His creatinine went up to 1.65 but quickly resolved. Once creatinine was normalized, he was diuresed. He was felt surgically stable for transfer  from the ICU to 4E on 05/12. He was having difficulty voiding and was started on Flomax.  Bladder scan showed >400cc and he required I/O catheterization with removal of 800 cc of urine.  He was constipated and was treated with laxatives and did achieve the result on postop day 5.  He is ambulating on room air with good oxygenation. All wounds are clean, dry, healing without signs of infection.  Unfortunately on postoperative day #6 he went back into atrial fibrillation with RVR and has subsequently been placed on an amiodarone drip.  Additionally his creatinine did increase to 1.66 and is felt he may have been over diuresed.  Diuretics are currently on hold.

## 2022-07-24 NOTE — Plan of Care (Signed)

## 2022-07-24 NOTE — Anesthesia Procedure Notes (Signed)
Procedure Name: Intubation Date/Time: 07/24/2022 8:42 AM  Performed by: Gaynelle Adu, MDPre-anesthesia Checklist: Patient identified, Emergency Drugs available, Suction available and Patient being monitored Patient Re-evaluated:Patient Re-evaluated prior to induction Oxygen Delivery Method: Circle system utilized Preoxygenation: Pre-oxygenation with 100% oxygen Induction Type: IV induction Ventilation: Mask ventilation without difficulty Laryngoscope Size: Mac and 4 Grade View: Grade II Tube type: Oral Tube size: 8.0 mm Number of attempts: 1 Airway Equipment and Method: Stylet and Oral airway Placement Confirmation: ETT inserted through vocal cords under direct vision, positive ETCO2 and breath sounds checked- equal and bilateral Secured at: 21 cm Tube secured with: Tape Dental Injury: Teeth and Oropharynx as per pre-operative assessment

## 2022-07-24 NOTE — Anesthesia Procedure Notes (Signed)
Central Venous Catheter Insertion Performed by: Gaynelle Adu, MD, anesthesiologist Start/End5/10/2022 7:30 AM, 07/24/2022 7:45 AM Patient location: Pre-op. Preanesthetic checklist: patient identified, IV checked, site marked, risks and benefits discussed, surgical consent, monitors and equipment checked, pre-op evaluation, timeout performed and anesthesia consent Position: Trendelenburg Lidocaine 1% used for infiltration and patient sedated Hand hygiene performed , maximum sterile barriers used  and Seldinger technique used Catheter size: 8.5 Fr Total catheter length 10. Central line was placed.Sheath introducer Procedure performed using ultrasound guided technique. Ultrasound Notes:anatomy identified, needle tip was noted to be adjacent to the nerve/plexus identified, no ultrasound evidence of intravascular and/or intraneural injection and image(s) printed for medical record Attempts: 1 Following insertion, line sutured, dressing applied and Biopatch. Post procedure assessment: blood return through all ports, free fluid flow and no air  Patient tolerated the procedure well with no immediate complications.

## 2022-07-24 NOTE — Progress Notes (Signed)
   07/24/22 1927  Adult Ventilator Settings  Vent Type Servo i  Humidity HME  Vent Mode PRVC;SIMV;PSV  Vt Set 570 mL  Set Rate 4 bmp  FiO2 (%) 40 %  I Time 0.9 Sec(s)  PEEP 5 cmH20  Adult Ventilator Measurements  SpO2 100 %   Placed pt. On rapid wean protocol

## 2022-07-24 NOTE — Interval H&P Note (Signed)
History and Physical Interval Note:  07/24/2022 7:30 AM  Walter George  has presented today for surgery, with the diagnosis of CAD.  The various methods of treatment have been discussed with the patient and family. After consideration of risks, benefits and other options for treatment, the patient has consented to  Procedure(s): CORONARY ARTERY BYPASS GRAFTING (CABG) (N/A) TRANSESOPHAGEAL ECHOCARDIOGRAM (N/A) as a surgical intervention.  The patient's history has been reviewed, patient examined, no change in status, stable for surgery.  I have reviewed the patient's chart and labs.  Questions were answered to the patient's satisfaction.     [name]

## 2022-07-24 NOTE — Transfer of Care (Signed)
Immediate Anesthesia Transfer of Care Note  Patient: Walter George  Procedure(s) Performed: CORONARY ARTERY BYPASS GRAFTING (CABG) X5 USING LEFT INTERNAL MAMMARY ARTERY AND ENDOSCOPICALLY HARVESTED RIGHT GREATER SAPHENOUS VEIN (Chest) TRANSESOPHAGEAL ECHOCARDIOGRAM CLIPPING OF ATRIAL APPENDAGE USING ATRICURE ATRICLIP SIZE (Left: Chest) RIGHT CORONARY ENDARTERECETOMY (Right: Chest)  Patient Location: ICU  Anesthesia Type:General  Level of Consciousness: sedated and Patient remains intubated per anesthesia plan  Airway & Oxygen Therapy: Patient remains intubated per anesthesia plan and Patient placed on Ventilator (see vital sign flow sheet for setting)  Post-op Assessment: Report given to RN and Post -op Vital signs reviewed and stable  Post vital signs: Reviewed and stable  Last Vitals:  Vitals Value Taken Time  BP 91/57   Temp    Pulse 65 07/24/22 1419  Resp 16 07/24/22 1419  SpO2 99 % 07/24/22 1419  Vitals shown include unvalidated device data.  Last Pain:  Vitals:   07/24/22 0529  TempSrc: Oral  PainSc:       Patients Stated Pain Goal: 0 (07/23/22 2015)  Complications: No notable events documented.

## 2022-07-24 NOTE — Anesthesia Procedure Notes (Signed)
Arterial Line Insertion Start/End5/10/2022 7:45 AM Performed by: Marcene Duos, MD, Garfield Cornea, CRNA, CRNA  Patient location: Pre-op. Preanesthetic checklist: patient identified, IV checked, site marked, risks and benefits discussed, surgical consent, monitors and equipment checked, pre-op evaluation, timeout performed and anesthesia consent Lidocaine 1% used for infiltration and patient sedated Left, radial was placed Catheter size: 20 G Hand hygiene performed , maximum sterile barriers used  and Seldinger technique used Allen's test indicative of satisfactory collateral circulation Attempts: 1 Procedure performed without using ultrasound guided technique. Following insertion, dressing applied and Biopatch. Post procedure assessment: normal and unchanged  Patient tolerated the procedure well with no immediate complications. Additional procedure comments: Inserted by Merry Proud under CRNA and MDA supervision.

## 2022-07-24 NOTE — Anesthesia Postprocedure Evaluation (Signed)
Anesthesia Post Note  Patient: Walter George  Procedure(s) Performed: CORONARY ARTERY BYPASS GRAFTING (CABG) X5 USING LEFT INTERNAL MAMMARY ARTERY AND ENDOSCOPICALLY HARVESTED RIGHT GREATER SAPHENOUS VEIN (Chest) TRANSESOPHAGEAL ECHOCARDIOGRAM CLIPPING OF ATRIAL APPENDAGE USING ATRICURE ATRICLIP SIZE (Left: Chest) RIGHT CORONARY ENDARTERECETOMY (Right: Chest)     Patient location during evaluation: SICU Anesthesia Type: General Level of consciousness: sedated Pain management: pain level controlled Vital Signs Assessment: post-procedure vital signs reviewed and stable Respiratory status: patient remains intubated per anesthesia plan Cardiovascular status: stable Postop Assessment: no apparent nausea or vomiting Anesthetic complications: no  No notable events documented.  Last Vitals:  Vitals:   07/24/22 1545 07/24/22 1600  BP:    Pulse:    Resp: 16 16  Temp: (!) 36.2 C (!) 36.3 C  SpO2:      Last Pain:  Vitals:   07/24/22 0529  TempSrc: Oral  PainSc:                  Walter George,W. EDMOND

## 2022-07-24 NOTE — Progress Notes (Signed)
  Echocardiogram Echocardiogram Transesophageal has been performed.  Delcie Roch 07/24/2022, 10:32 AM

## 2022-07-24 NOTE — Progress Notes (Signed)
Patient ID: Walter George, male   DOB: 1945-03-31, 77 y.o.   MRN: [id number]   TCTS Evening Rounds:   Hemodynamically stable  CI = 1.7 with low filling pressures after 4 units albumin. Co-ox 68%  Has started to wake up on vent.   Urine output good  CT output low  CBC    Component Value Date/Time   WBC 7.2 07/24/2022 1425   RBC 3.25 (L) 07/24/2022 1425   HGB 10.2 (L) 07/24/2022 1425   HGB 15.8 07/03/2022 0952   HCT 30.1 (L) 07/24/2022 1425   HCT 47.1 07/03/2022 0952   PLT 143 (L) 07/24/2022 1425   PLT 296 07/03/2022 0952   MCV 92.6 07/24/2022 1425   MCV 93 07/03/2022 0952   MCH 31.4 07/24/2022 1425   MCHC 33.9 07/24/2022 1425   RDW 12.6 07/24/2022 1425   RDW 12.4 07/03/2022 0952   LYMPHSABS 2.3 02/22/2009 1017   MONOABS 0.6 02/22/2009 1017   EOSABS 0.6 02/22/2009 1017   BASOSABS 0.1 02/22/2009 1017     BMET    Component Value Date/Time   NA 138 07/24/2022 1319   NA 141 07/03/2022 0952   K 5.5 (H) 07/24/2022 1319   CL 108 07/24/2022 1319   CO2 21 (L) 07/24/2022 0537   GLUCOSE 110 (H) 07/24/2022 1319   BUN 18 07/24/2022 1319   BUN 20 07/03/2022 0952   CREATININE 1.00 07/24/2022 1319   CALCIUM 8.5 (L) 07/24/2022 0537   EGFR 71 07/03/2022 0952   GFRNONAA >60 07/24/2022 0537     A/P:  Stable postop course. Continue current plans

## 2022-07-24 NOTE — Anesthesia Procedure Notes (Signed)
Central Venous Catheter Insertion Performed by: Gaynelle Adu, MD, anesthesiologist Start/End5/10/2022 7:30 AM, 07/24/2022 7:45 AM Patient location: Pre-op. Preanesthetic checklist: patient identified, IV checked, site marked, risks and benefits discussed, surgical consent, monitors and equipment checked, pre-op evaluation, timeout performed and anesthesia consent Hand hygiene performed  and maximum sterile barriers used  PA cath was placed.Swan type:thermodilution PA Cath depth:50 Procedure performed without using ultrasound guided technique. Attempts: 1 Patient tolerated the procedure well with no immediate complications.

## 2022-07-24 NOTE — Plan of Care (Signed)
  Problem: Clinical Measurements: Goal: Ability to maintain clinical measurements within normal limits will improve 07/24/2022 0257 by Horris Latino, RN Outcome: Progressing 07/24/2022 0225 by Horris Latino, RN Outcome: Progressing Goal: Will remain free from infection 07/24/2022 0257 by Horris Latino, RN Outcome: Progressing 07/24/2022 0225 by Horris Latino, RN Outcome: Progressing Goal: Diagnostic test results will improve 07/24/2022 0257 by Horris Latino, RN Outcome: Progressing 07/24/2022 0225 by Horris Latino, RN Outcome: Progressing Goal: Respiratory complications will improve 07/24/2022 0257 by Horris Latino, RN Outcome: Progressing 07/24/2022 0225 by Horris Latino, RN Outcome: Progressing Goal: Cardiovascular complication will be avoided 07/24/2022 0257 by Horris Latino, RN Outcome: Progressing 07/24/2022 0225 by Horris Latino, RN Outcome: Progressing

## 2022-07-24 NOTE — Progress Notes (Signed)
   07/24/22 1952  Adult Ventilator Settings  Vent Type Servo i  Humidity HME  Vent Mode PSV  Set Rate 40 bmp  Pressure Support 10 cmH20  PEEP 5 cmH20  Adult Ventilator Measurements  SpO2 100 %   Placed pt. On PS 10/5 40% per rapid wean protocol

## 2022-07-24 NOTE — Op Note (Signed)
NAME: Walter George, Walter George MEDICAL RECORD NO: [id number] ACCOUNT NO: [id number] DATE OF BIRTH: [birth year] FACILITY: [facility] LOCATION: [facility] PHYSICIAN: Salvatore Decent. Dorris Fetch, MD  Operative Report   DATE OF PROCEDURE: 07/24/2022  PREOPERATIVE DIAGNOSIS:  Three-vessel coronary disease, status post non-ST elevation MI and atrial fibrillation, status post ablation.  POSTOPERATIVE DIAGNOSIS:  Three-vessel coronary disease, status post non-ST elevation MI and atrial fibrillation, status post ablation.  PROCEDURE PERFORMED:  Median sternotomy, extracorporeal circulation,  Coronary artery bypass grafting x5  Left internal mammary artery to LAD,  Saphenous vein graft to first diagonal,  Sequential saphenous vein graft to ramus intermedius and obtuse marginal,  Saphenous vein graft to distal right coronary artery with endarterectomy,  Left atrial appendage clip using 45 mm AtriCure flex-V AtriClip  Endoscopic vein harvest right leg.  SURGEON:  Salvatore Decent. Dorris Fetch, MD  ASSISTANT:  Lowella Dandy, PA  ANESTHESIA:  General.  FINDINGS:  Transesophageal echocardiography showed preserved left ventricular function.  There was no significant valvular dysfunction.  There was a left to right shunting at the transatrial puncture site from his previous ablation. Saphenous vein and left mammary good quality. Epicardial inflammation, but no significant pericardial inflammation.  LAD and ramus intermedius intramyocardial.  Distal right coronary and PD fair quality, remaining targets good quality.  CLINICAL NOTE:  Walter George is a 77 year old gentleman with a history of atrial fibrillation and known coronary calcifications.  He had an ablation for atrial fibrillation on 07/19/2022.  He presented back to the following evening with chest pain and had elevated high sensitivity troponins.  Cardiac catheterization showed severe 3-vessel coronary artery disease.  There was a 70% proximal to mid LAD stenosis that was  significant by FFR.  He was advised to undergo coronary artery bypass grafting and left atrial appendage clipping.  Echocardiogram had shown a left to right shunt at the site of his transatrial puncture.  After discussion with electrophysiology decision was made to leave that as it should heal on its own.  He understood and accepted the risks of surgery and agreed to proceed.  OPERATIVE NOTE:  Walter George was brought to the preoperative holding area on 07/24/2022.  Anesthesia service placed a Swan-Ganz catheter and an arterial blood pressure monitoring line.  He was taken to the operating room and anesthetized and intubated.  A Foley catheter was placed.  Intravenous antibiotics were administered.  The chest, abdomen and legs were prepped and draped in the usual sterile fashion.  Dr. Autumn Patty performed a transesophageal echocardiography, which revealed the findings noted above.  Please see his separately dictated note for full details.  The chest, abdomen and legs were prepped and draped in the usual sterile fashion.  A timeout was performed.  An incision was made in the medial aspect of the right leg at the level of the knee, the greater saphenous vein was identified, was harvested from the groin to the calves endoscopically.  The vein was of good quality.  Simultaneously with the vein harvest a median sternotomy was performed.  Initial hemostasis was attained.  Rultract retractor was placed and the left internal mammary artery was harvested using standard technique, 2000 units of heparin was administered during the vessel harvest.  The remainder of the full heparin dose was given prior to opening the pericardium.  After harvesting the conduits the pericardium was opened.  Adequate anticoagulation was confirmed with ACT measurement, and the aorta was cannulated via concentric 2-0 Ethibond pledgeted pursestring sutures.  The aorta was of normal caliber with no  evidence of atherosclerotic disease.  A  dual stage venous cannula was placed via a pursestring suture in right atrial appendage.  Cardiopulmonary bypass was initiated.  Flows were maintained per protocol.  The patient was cooled to 32 degrees Celsius.  The coronary arteries were inspected.  The LAD and ramus were both intramyocardial vessels and were dissected out later under crossclamp.  The conduits were inspected and cut to length.  A foam pad was placed in the pericardium to insulate the heart.  A temperature probe was placed in the myocardial septum and a cardioplegia cannula was placed in the ascending aorta.  The aorta was crossclamped.  The left ventricle was emptied via the aortic root vent.  Cardiac arrest then was achieved with a combination of cold antegrade blood cardioplegia and topical ice saline.  There was a rapid diastolic arrest and septal cooling to 9 degrees Celsius.  The inferior wall was exposed.  The posterior descending was identified.  There was plaque at the distal right coronary.  An arteriotomy was made in the posterior descending and then extended retrograde across the plaque. The posterior descending was a 1.5 mm vessel.  A reversed saphenous vein graft was anastomosed end-to-side to the distal right coronary/posterior descending, but when initiating the anastomosis the plaque elevated and separated from the adventitia and therefore an endarterectomy was performed.  The anastomosis then was performed with a running 7-0 Prolene suture.  At the completion of the anastomosis, a probe passed both proximally and distally in the right coronary as well as distally in the posterior descending. With cardioplegia administration, there was good flow and good hemostasis.  Next, a reversed saphenous vein graft was placed end-to-side to the first diagonal branch of the LAD.  This was a 1.5 mm good quality target vessel and the anastomosis was performed with a running 7-0 Prolene suture.  There was good flow and good hemostasis at the  anastomosis.  Next, the lateral wall of the heart was exposed.  The ramus intermedius was intramyocardial.  It was dissected out and ran in relatively close proximity to the diagonal. It was a 1.5 mm good quality target.  The side-to-side anastomosis to the ramus intermedius was performed with a running 7-0 Prolene suture.  A probe passed easily distally.  The distal end of the vein then was bevelled and was anastomosed end-to-side to the large OM branch, which was a 1.5 mm good quality vessel as well.  The anastomosis was also done with a running 7-0 Prolene suture and a probe passed easily proximally and distally.  Cardioplegia was administered down the graft and there was good flow and there was good hemostasis at both anastomoses.  Additional cardioplegia was administered down the aortic root.  Next, the left internal mammary artery was brought through a window in the pericardium.  The distal end was bevelled.  It was anastomosed end-to-side to the LAD.  The LAD was intramyocardial at the site of anastomosis.  It was 2 mm, good quality vessel.  The mammary was 2 mm, good quality conduit.  The end-to-side anastomosis was performed with a running 8-0 Prolene suture.  At the completion of the mammary to LAD anastomosis, the bulldog clamp was briefly removed.  Rapid septal rewarming was noted.  There was good hemostasis.  The bulldog clamp was replaced.    Additional cardioplegia was administered.  The vein grafts were cut to length.  The cardioplegia cannula  was removed from the ascending aorta and the proximal vein  graft anastomoses were performed to 4.5 mm punch aortotomies with running 6-0 Prolene sutures.  At the completion of the final proximal anastomosis, the patient was placed in Trendelenburg position.  Lidocaine was administered.  The aortic root was de-aired.  The aortic crossclamp was removed.  The total crossclamp time was 115 minutes.  While rewarming was completed, all proximal and distal  anastomoses were inspected for hemostasis.  Epicardial pacing wires were placed on the right ventricle and right atrium and the patient had rewarmed to a core temperature of 37 degrees Celsius, he was weaned from bypass on the first attempt.  Total bypass time was 155 minutes.  The initial cardiac index was greater than 2 liters per minute per meter squared.  He remained hemodynamically stable throughout the post-bypass.  Post-bypass transesophageal echocardiography was unchanged from the prebypass study.  A test dose of protamine was administered and was well tolerated.  The atrial and aortic cannulae were removed.  The remainder of protamine was administered without incident.  Chest was irrigated with warm saline.  Hemostasis was achieved.  The pericardium was reapproximated over the aorta and base of the heart with interrupted 3-0 silk sutures.  Left pleural and mediastinal chest tubes were placed via separate subcostal incisions.  Sternum was closed with a combination of single and double heavy gauge stainless steel wires.  Pectoralis fascia, subcutaneous tissue and skin were closed in standard fashion.  All sponge, needle and instrument counts were correct at the end of the procedure.  Experienced assistance was necessary for this case due to surgical complexity.  Erin Barrett served as the first Armed forces technical officer the saphenous vein independently as I harvested the mammary artery and then providing exposure, retraction of delicate tissue, suctioning and suture management during the anastomoses.   PUS D: 07/24/2022 5:15:14 pm T: 07/24/2022 9:07:00 pm  JOB: [id number]/ [id number]

## 2022-07-25 ENCOUNTER — Encounter (HOSPITAL_COMMUNITY): Payer: Self-pay | Admitting: Thoracic Surgery (Cardiothoracic Vascular Surgery)

## 2022-07-25 ENCOUNTER — Inpatient Hospital Stay (HOSPITAL_COMMUNITY): Payer: Medicare Other

## 2022-07-25 DIAGNOSIS — I200 Unstable angina: Secondary | ICD-10-CM | POA: Diagnosis not present

## 2022-07-25 LAB — BASIC METABOLIC PANEL
Anion gap: 8 (ref 5–15)
Anion gap: 8 (ref 5–15)
BUN: 15 mg/dL (ref 8–23)
BUN: 22 mg/dL (ref 8–23)
CO2: 20 mmol/L — ABNORMAL LOW (ref 22–32)
CO2: 17 mmol/L — ABNORMAL LOW (ref 22–32)
Calcium: 7.9 mg/dL — ABNORMAL LOW (ref 8.9–10.3)
Calcium: 8.3 mg/dL — ABNORMAL LOW (ref 8.9–10.3)
Chloride: 107 mmol/L (ref 98–111)
Chloride: 107 mmol/L (ref 98–111)
Creatinine, Ser: 1.08 mg/dL (ref 0.61–1.24)
Creatinine, Ser: 1.34 mg/dL — ABNORMAL HIGH (ref 0.61–1.24)
GFR, Estimated: >60 mL/min (ref >60)
GFR, Estimated: 55 mL/min — ABNORMAL LOW (ref >60)
Glucose, Bld: 113 mg/dL — ABNORMAL HIGH (ref 70–99)
Glucose, Bld: 192 mg/dL — ABNORMAL HIGH (ref 70–99)
Potassium: 5.3 mmol/L — ABNORMAL HIGH (ref 3.5–5.1)
Potassium: 5.1 mmol/L (ref 3.5–5.1)
Sodium: 135 mmol/L (ref 135–145)
Sodium: 132 mmol/L — ABNORMAL LOW (ref 135–145)

## 2022-07-25 LAB — CBC
HCT: 29.3 % — ABNORMAL LOW (ref 39.0–52.0)
HCT: 28.7 % — ABNORMAL LOW (ref 39.0–52.0)
Hemoglobin: 9.6 g/dL — ABNORMAL LOW (ref 13.0–17.0)
Hemoglobin: 9.3 g/dL — ABNORMAL LOW (ref 13.0–17.0)
MCH: 30.9 pg (ref 26.0–34.0)
MCH: 30.8 pg (ref 26.0–34.0)
MCHC: 32.8 g/dL (ref 30.0–36.0)
MCHC: 32.4 g/dL (ref 30.0–36.0)
MCV: 94.2 fL (ref 80.0–100.0)
MCV: 95 fL (ref 80.0–100.0)
Platelets: 172 10*3/uL (ref 150–400)
Platelets: 158 10*3/uL (ref 150–400)
RBC: 3.11 MIL/uL — ABNORMAL LOW (ref 4.22–5.81)
RBC: 3.02 MIL/uL — ABNORMAL LOW (ref 4.22–5.81)
RDW: 12.8 % (ref 11.5–15.5)
RDW: 13.2 % (ref 11.5–15.5)
WBC: 9.1 10*3/uL (ref 4.0–10.5)
WBC: 10.7 10*3/uL — ABNORMAL HIGH (ref 4.0–10.5)
nRBC: 0 % (ref 0.0–0.2)
nRBC: 0 % (ref 0.0–0.2)

## 2022-07-25 LAB — GLUCOSE, CAPILLARY
Glucose-Capillary: 113 mg/dL — ABNORMAL HIGH (ref 70–99)
Glucose-Capillary: 116 mg/dL — ABNORMAL HIGH (ref 70–99)
Glucose-Capillary: 119 mg/dL — ABNORMAL HIGH (ref 70–99)
Glucose-Capillary: 123 mg/dL — ABNORMAL HIGH (ref 70–99)
Glucose-Capillary: 126 mg/dL — ABNORMAL HIGH (ref 70–99)
Glucose-Capillary: 147 mg/dL — ABNORMAL HIGH (ref 70–99)
Glucose-Capillary: 152 mg/dL — ABNORMAL HIGH (ref 70–99)
Glucose-Capillary: 157 mg/dL — ABNORMAL HIGH (ref 70–99)

## 2022-07-25 LAB — COOXEMETRY PANEL
Carboxyhemoglobin: 3.3 % — ABNORMAL HIGH (ref 0.5–1.5)
Methemoglobin: <0.7 % (ref 0.0–1.5)
O2 Saturation: 75.5 %
Total hemoglobin: 9.8 g/dL — ABNORMAL LOW (ref 12.0–16.0)

## 2022-07-25 LAB — MAGNESIUM
Magnesium: 2.1 mg/dL (ref 1.7–2.4)
Magnesium: 2.4 mg/dL (ref 1.7–2.4)

## 2022-07-25 LAB — SURGICAL PATHOLOGY

## 2022-07-25 MED ORDER — INSULIN ASPART 100 UNIT/ML IJ SOLN
0.0000 [IU] | Freq: Three times a day (TID) | INTRAMUSCULAR | Status: DC
  Administered 2022-07-25: 3 [IU] via SUBCUTANEOUS
  Administered 2022-07-25 – 2022-07-26 (×2): 2 [IU] via SUBCUTANEOUS

## 2022-07-25 MED ORDER — ALBUMIN HUMAN 5 % IV SOLN
12.5000 g | Freq: Once | INTRAVENOUS | Status: AC
  Administered 2022-07-25: 12.5 g via INTRAVENOUS
  Filled 2022-07-25: qty 250

## 2022-07-25 MED ORDER — INSULIN ASPART 100 UNIT/ML IJ SOLN
0.0000 [IU] | INTRAMUSCULAR | Status: DC
  Administered 2022-07-25: 2 [IU] via SUBCUTANEOUS

## 2022-07-25 MED ORDER — CALCIUM GLUCONATE-NACL 1-0.675 GM/50ML-% IV SOLN
1.0000 g | Freq: Once | INTRAVENOUS | Status: AC
  Administered 2022-07-25: 1000 mg via INTRAVENOUS
  Filled 2022-07-25: qty 50

## 2022-07-25 MED ORDER — ENOXAPARIN SODIUM 40 MG/0.4ML IJ SOSY
40.0000 mg | PREFILLED_SYRINGE | Freq: Every day | INTRAMUSCULAR | Status: DC
  Administered 2022-07-25 – 2022-07-26 (×2): 40 mg via SUBCUTANEOUS
  Filled 2022-07-25 (×2): qty 0.4

## 2022-07-25 MED FILL — Heparin Sodium (Porcine) Inj 1000 Unit/ML: Qty: 1000 | Status: AC

## 2022-07-25 MED FILL — Potassium Chloride Inj 2 mEq/ML: INTRAVENOUS | Qty: 40 | Status: AC

## 2022-07-25 MED FILL — Magnesium Sulfate Inj 50%: INTRAMUSCULAR | Qty: 10 | Status: AC

## 2022-07-25 NOTE — Progress Notes (Signed)
EVENING ROUNDS NOTE :     [address]       [place] [id number]             [phone]                 1 Day Post-Op Procedure(s) (LRB): CORONARY ARTERY BYPASS GRAFTING (CABG) X5 USING LEFT INTERNAL MAMMARY ARTERY AND ENDOSCOPICALLY HARVESTED RIGHT GREATER SAPHENOUS VEIN (N/A) TRANSESOPHAGEAL ECHOCARDIOGRAM (N/A) CLIPPING OF ATRIAL APPENDAGE USING ATRICURE ATRICLIP SIZE (Left) RIGHT CORONARY ENDARTERECETOMY (Right)   Total Length of Stay:  LOS: 4 days  Events:   Ambulated today Remains on some neo    BP 95/74   Pulse 78   Temp 97.7 F (36.5 C) (Oral)   Resp 18   Ht 5\' 9"  (1.753 m)   Wt 75.1 kg   SpO2 95%   BMI 24.45 kg/m   PAP: (12-57)/(5-48) 26/22 CVP:  [1 mmHg-38 mmHg] 10 mmHg CO:  [3.1 L/min-4.7 L/min] 4.1 L/min CI:  [1.7 L/min/m2-2.5 L/min/m2] 2.2 L/min/m2  Vent Mode: PSV FiO2 (%):  [40 %] 40 % Set Rate:  [4 bmp-40 bmp] 40 bmp Vt Set:  [570 mL] 570 mL PEEP:  [5 cmH20] 5 cmH20 Pressure Support:  [10 cmH20] 10 cmH20   sodium chloride     sodium chloride     sodium chloride 20 mL/hr at 07/25/22 1400    ceFAZolin (ANCEF) IV 2 g (07/25/22 1437)   lactated ringers     lactated ringers Stopped (07/25/22 0930)   nitroGLYCERIN     phenylephrine (NEO-SYNEPHRINE) Adult infusion 50 mcg/min (07/25/22 1556)    I/O last 3 completed shifts: In: 5765.3 [I.V.:4259.1; Blood:300; IV Piggyback:1206.1] Out: 3577 [Urine:2635; Blood:550; Chest Tube:392]      Latest Ref Rng & Units 07/25/2022    4:07 AM 07/24/2022    9:35 PM 07/24/2022    8:14 PM  CBC  WBC 4.0 - 10.5 K/uL 9.1     Hemoglobin 13.0 - 17.0 g/dL 9.6  9.2  9.2   Hematocrit 39.0 - 52.0 % 29.3  27.0  27.0   Platelets 150 - 400 K/uL 172          Latest Ref Rng & Units 07/25/2022    4:07 AM 07/24/2022    9:35 PM 07/24/2022    8:14 PM  BMP  Glucose 70 - 99 mg/dL 914     BUN 8 - 23 mg/dL 15     Creatinine 7.82 - 1.24 mg/dL 9.56     Sodium 213 - 086 mmol/L 135  139  137   Potassium 3.5 -  5.1 mmol/L 5.3  5.1  5.5   Chloride 98 - 111 mmol/L 107     CO2 22 - 32 mmol/L 20     Calcium 8.9 - 10.3 mg/dL 7.9       ABG    Component Value Date/Time   PHART 7.345 (L) 07/24/2022 2135   PCO2ART 35.3 07/24/2022 2135   PO2ART 157 (H) 07/24/2022 2135   HCO3 19.2 (L) 07/24/2022 2135   TCO2 20 (L) 07/24/2022 2135   ACIDBASEDEF 6.0 (H) 07/24/2022 2135   O2SAT 75.5 07/25/2022 0407       Brynda Greathouse, MD 07/25/2022 4:42 PM

## 2022-07-25 NOTE — Progress Notes (Signed)
Patient ID: Walter George, male   DOB: [birth year], 77 y.o.   MRN: [id number]     Advanced Heart Failure Rounding Note  PCP-Cardiologist: None   Subjective:    S/p CABG, post OP day # 1  Extubated last night.  Continues on Neo at 70.  CO-OX 76%.   SWAN out. CVP 3-4 on check by RN this am. Receiving albumin  Chest sore but overall pain is controlled. Passing gas. Getting ready to go for a walk.   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   Weight Range: 75.1 kg Body mass index is 24.45 kg/m.   Vital Signs:   Temp:  [96.6 F (35.9 C)-100 F (37.8 C)] 99.7 F (37.6 C) (05/09 0700) Pulse Rate:  [74-90] 90 (05/09 0700) Resp:  [13-32] 16 (05/09 0700) SpO2:  [82 %-100 %] 100 % (05/09 0700) Arterial Line BP: (78-148)/(48-89) 95/50 (05/09 0700) FiO2 (%):  [40 %-50 %] 40 % (05/08 1927) Weight:  [75.1 kg] 75.1 kg (05/09 0500) Last BM Date : 07/22/22  Weight change: Filed Weights   07/21/22 0055 07/25/22 0500  Weight: 74.2 kg 75.1 kg   Intake/Output:   Intake/Output Summary (Last 24 hours) at 07/25/2022 1105 Last data filed at 07/25/2022 1000 Gross per 24 hour  Intake 4955.26 ml  Output 3277 ml  Net 1678.26 ml    Physical Exam  General:  Lying comfortably in bed. HEENT: normal Neck: supple. no JVD. Carotids 2+ bilat; no bruits. R IJ introducer Cor: PMI nondisplaced. Regular rate & rhythm. No rubs, gallops or murmurs. Dressing over sternal incision Lungs: clear Abdomen: soft, nontender, nondistended. No hepatosplenomegaly.  Extremities: no cyanosis, clubbing, rash, edema Neuro: alert & orientedx3. Affect pleasant    Telemetry   A paced 90  Labs    CBC Recent Labs    07/24/22 2010 07/24/22 2014 07/24/22 2135 07/25/22 0407  WBC 8.9  --   --  9.1  HGB 10.2*   < > 9.2* 9.6*  HCT 29.0*   < > 27.0* 29.3*  MCV 91.5  --   --  94.2  PLT 173  --   --  172   < > = values in this interval  not displayed.   Basic Metabolic Panel Recent Labs    08/65/78 2010 07/24/22 2014 07/24/22 2135 07/25/22 0407  NA 137   < > 139 135  K 5.5*   < > 5.1 5.3*  CL 107  --   --  107  CO2 19*  --   --  20*  GLUCOSE 105*  --   --  113*  BUN 16  --   --  15  CREATININE 1.15  --   --  1.08  CALCIUM 8.8*  --   --  7.9*  MG 2.8*  --   --  2.1   < > = values in this interval not displayed.   Liver Function Tests No results for input(s): "AST", "ALT", "ALKPHOS", "BILITOT", "PROT", "ALBUMIN" in the last 72 hours. No results for input(s): "LIPASE", "AMYLASE" in the last 72 hours. Cardiac Enzymes No results for input(s): "CKTOTAL", "CKMB", "CKMBINDEX", "TROPONINI" in the last 72 hours.  BNP: BNP (last 3 results) Recent Labs    02/27/22 1651  BNP 68.6    ProBNP (last 3 results) No results for input(s): "PROBNP" in the last 8760 hours.   D-Dimer No results for input(s): "DDIMER" in the  last 72 hours. Hemoglobin A1C Recent Labs    07/23/22 0321  HGBA1C 5.6   Fasting Lipid Panel Recent Labs    07/23/22 0321  CHOL 91  HDL 46  LDLCALC 36  TRIG 47  CHOLHDL 2.0   Thyroid Function Tests No results for input(s): "TSH", "T4TOTAL", "T3FREE", "THYROIDAB" in the last 72 hours.  Invalid input(s): "FREET3"  Other results:   Imaging    DG Chest Port 1 View  Result Date: 07/25/2022 CLINICAL DATA:  Status post CABG EXAM: PORTABLE CHEST 1 VIEW COMPARISON:  CXR 07/24/22 FINDINGS: Status post median sternotomy and CABG. Right-sided central venous catheter sheath and PA catheter in place. The PA catheter has been retracted with the tip now positioned near the main PA. Mediastinal drain in place. Left-sided thoracostomy tube in place. Small left pleural effusion, new from prior exam. There is new small right-sided pneumothorax. There is a hazy opacity at the left lung base which could represent atelectasis or infection. Visualized upper abdomen is unremarkable. No radiographically apparent  displaced rib fractures. IMPRESSION: 1. New small right-sided pneumothorax. 2. Small left pleural effusion, new from prior exam. 3. Hazy opacity at the left lung base could represent atelectasis or infection. 4. Lines and tubes as described above. These results will be called to the ordering clinician or representative by the Radiologist Assistant, and communication documented in the PACS or [employer]. Electronically Signed   By: Lorenza Cambridge M.D.   On: 07/25/2022 08:43   DG Chest Port 1 View  Result Date: 07/24/2022 CLINICAL DATA:  Post CABG EXAM: PORTABLE CHEST 1 VIEW COMPARISON:  07/23/2022 FINDINGS: Endotracheal tube tip is about 6 cm superior to the carina. Esophageal tube tip below the diaphragm but incompletely visualized. Right IJ Swan-Ganz catheter is looped in the region of central pulmonary arteries with the tip directed cranial and to the patient's left, likely in the region of left upper lobe artery. Interval sternotomy and left atrial appendage clip. Placement of mediastinal and chest drainage catheters. Minimal atelectasis left lung base. Normal cardiomediastinal silhouette. Possible tiny left effusion. Possible trace right apical pneumothorax. IMPRESSION: 1. Endotracheal tube tip about 6 cm superior to carina. 2. Right IJ Swan-Ganz catheter is looped in the region of central pulmonary artery with the tip directed cranial and to the patient's left, likely in the region of left upper lobe artery. 3. Possible trace right apical pneumothorax. 4. Mild left basilar atelectasis and trace left pleural effusion. These results will be called to the ordering clinician or representative by the Radiologist Assistant, and communication documented in the PACS or [employer]. Electronically Signed   By: Jasmine Pang M.D.   On: 07/24/2022 15:13   EP STUDY  Result Date: 07/24/2022 See surgical note for result.    Medications:     Scheduled Medications:  acetaminophen  1,000 mg Oral Q6H   Or    acetaminophen (TYLENOL) oral liquid 160 mg/5 mL  1,000 mg Per Tube Q6H   amiodarone  200 mg Oral BID   aspirin EC  325 mg Oral Daily   Or   aspirin  324 mg Per Tube Daily   atorvastatin  80 mg Oral Daily   bisacodyl  10 mg Oral Daily   Or   bisacodyl  10 mg Rectal Daily   Chlorhexidine Gluconate Cloth  6 each Topical Daily   docusate sodium  200 mg Oral Daily   enoxaparin (LOVENOX) injection  40 mg Subcutaneous QHS   ezetimibe  10 mg Oral  Daily   insulin aspart  0-15 Units Subcutaneous TID WC   metoprolol tartrate  12.5 mg Oral BID   Or   metoprolol tartrate  12.5 mg Per Tube BID   [START ON 07/26/2022] pantoprazole  40 mg Oral Daily   sodium chloride flush  3 mL Intravenous Q12H    Infusions:  sodium chloride     sodium chloride     sodium chloride 20 mL/hr at 07/25/22 1000    ceFAZolin (ANCEF) IV Stopped (07/25/22 0550)   lactated ringers     lactated ringers Stopped (07/25/22 0930)   nitroGLYCERIN     phenylephrine (NEO-SYNEPHRINE) Adult infusion 70 mcg/min (07/25/22 1000)    PRN Medications: sodium chloride, metoprolol tartrate, midazolam, morphine injection, ondansetron (ZOFRAN) IV, oxyCODONE, sodium chloride flush, traMADol   Assessment/Plan  1. CAD:  On 5/3, he developed pleuritic chest pain and came to the ER.  HS-TnI 1400=>1065.  Cath was done, showing severe 3 vessel disease with 95% mRCA, 90% ostial LCx, 99% ramus, 100% D1 with collaterals, 70% prox-mid LAD with abnormal RFR.  No vessel looked like acute plaque rupture.  Discussed with Dr. Lalla Brothers, suspect the pleuritic chest pain and TnI elevation were due to the AF ablation and not ACS.   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  - S/p CABG X 5 05/08. - Continue aspirin and statin.  - Requiring 70 Neo to maintain MAP. CVP 3-4 this am. Giving albumin. Recheck CVP this afternoon. If volume status still low will need additional volume.   2. Atrial  fibrillation: Paroxysmal, s/p ablation on 5/2.  He is in NSR now.  - Left atrial appendage clipped at time of CABG - Risk for recurrence of AF perioperatively, Continue amiodarone 200 mg bid and continue until a month or so post-op if he remains in NSR.   - Anticoagulation when okay with TCTS  3. Hyperkalemia - K up to 6.4>>5.5>>5.3 - Primary team correcting  4. Small right PTX   Length of Stay: 4  Adyn Serna N, PA-C  07/25/2022, 11:05 AM  Advanced Heart Failure Team Pager [phone] (M-F; 7a - 5p)  Please contact [facility] for night-coverage after hours (5p -7a ) and weekends on [email or web address]

## 2022-07-25 NOTE — Progress Notes (Signed)
Seen on afternoon rounds.  CVP 7 after albumin.  Weaning phenylephrine.

## 2022-07-25 NOTE — Progress Notes (Signed)
1 Day Post-Op Procedure(s) (LRB): CORONARY ARTERY BYPASS GRAFTING (CABG) X5 USING LEFT INTERNAL MAMMARY ARTERY AND ENDOSCOPICALLY HARVESTED RIGHT GREATER SAPHENOUS VEIN (N/A) TRANSESOPHAGEAL ECHOCARDIOGRAM (N/A) CLIPPING OF ATRIAL APPENDAGE USING ATRICURE ATRICLIP SIZE (Left) RIGHT CORONARY ENDARTERECETOMY (Right) Subjective: Some incisional pain  Objective: Vital signs in last 24 hours: Temp:  [96.6 F (35.9 C)-100 F (37.8 C)] 99.7 F (37.6 C) (05/09 0700) Pulse Rate:  [45-90] 90 (05/09 0700) Cardiac Rhythm: A-V Sequential paced (05/09 0400) Resp:  [12-32] 16 (05/09 0700) BP: (96)/(66) 96/66 (05/08 0800) SpO2:  [82 %-100 %] 100 % (05/09 0700) Arterial Line BP: (78-148)/(48-89) 95/50 (05/09 0700) FiO2 (%):  [40 %-50 %] 40 % (05/08 1927) Weight:  [75.1 kg] 75.1 kg (05/09 0500)  Hemodynamic parameters for last 24 hours: PAP: (1-57)/(-2-48) 20/14 CVP:  [0 mmHg-38 mmHg] 3 mmHg CO:  [3.1 L/min-4.7 L/min] 4.1 L/min CI:  [1.6 L/min/m2-2.5 L/min/m2] 2.2 L/min/m2  Intake/Output from previous day: 05/08 0701 - 05/09 0700 In: 5285.8 [I.V.:3779.7; Blood:300; IV Piggyback:1206.1] Out: 3577 [Urine:2635; Blood:550; Chest Tube:392] Intake/Output this shift: No intake/output data recorded.  General appearance: alert, cooperative, and no distress Neurologic: intact Heart: regular rate and rhythm Lungs: diminished breath sounds bibasilar Abdomen: normal findings: soft, non-tender  Lab Results: Recent Labs    07/24/22 2010 07/24/22 2014 07/24/22 2135 07/25/22 0407  WBC 8.9  --   --  9.1  HGB 10.2*   < > 9.2* 9.6*  HCT 29.0*   < > 27.0* 29.3*  PLT 173  --   --  172   < > = values in this interval not displayed.   BMET:  Recent Labs    07/24/22 2010 07/24/22 2014 07/24/22 2135 07/25/22 0407  NA 137   < > 139 135  K 5.5*   < > 5.1 5.3*  CL 107  --   --  107  CO2 19*  --   --  20*  GLUCOSE 105*  --   --  113*  BUN 16  --   --  15  CREATININE 1.15  --   --  1.08   CALCIUM 8.8*  --   --  7.9*   < > = values in this interval not displayed.    PT/INR:  Recent Labs    07/24/22 1425  LABPROT 17.4*  INR 1.4*   ABG    Component Value Date/Time   PHART 7.345 (L) 07/24/2022 2135   HCO3 19.2 (L) 07/24/2022 2135   TCO2 20 (L) 07/24/2022 2135   ACIDBASEDEF 6.0 (H) 07/24/2022 2135   O2SAT 75.5 07/25/2022 0407   CBG (last 3)  Recent Labs    07/25/22 0302 07/25/22 0506 07/25/22 0606  GLUCAP 116* 119* 126*    Assessment/Plan: S/P Procedure(s) (LRB): CORONARY ARTERY BYPASS GRAFTING (CABG) X5 USING LEFT INTERNAL MAMMARY ARTERY AND ENDOSCOPICALLY HARVESTED RIGHT GREATER SAPHENOUS VEIN (N/A) TRANSESOPHAGEAL ECHOCARDIOGRAM (N/A) CLIPPING OF ATRIAL APPENDAGE USING ATRICURE ATRICLIP SIZE (Left) RIGHT CORONARY ENDARTERECETOMY (Right) POD # 1, Looks great NEURO- intact CV- in SR in 70s, paced for BP  Hypotension- on neo drip, low filling pressures- volume  ASA, statin, resume Eliquis prior to DC  Dc Swan RESP- CXR ok, good oxygenation  IS RENAL- creatinine normal, K 5.3  Hypocalcemia- calcium gluconate ENDO- CBG well controlled, SSI AC and HS GI- advance diet as tolerated Anemia secondary to ABL- Hct 29 , monitor SCD + enoxaparin for DVT prophylaxis Mobilize   LOS: 4 days    [name]  07/25/2022   

## 2022-07-26 ENCOUNTER — Inpatient Hospital Stay (HOSPITAL_COMMUNITY): Payer: Medicare Other

## 2022-07-26 DIAGNOSIS — I200 Unstable angina: Secondary | ICD-10-CM | POA: Diagnosis not present

## 2022-07-26 LAB — CBC
HCT: 27.7 % — ABNORMAL LOW (ref 39.0–52.0)
Hemoglobin: 9.6 g/dL — ABNORMAL LOW (ref 13.0–17.0)
MCH: 31.8 pg (ref 26.0–34.0)
MCHC: 34.7 g/dL (ref 30.0–36.0)
MCV: 91.7 fL (ref 80.0–100.0)
Platelets: 154 10*3/uL (ref 150–400)
RBC: 3.02 MIL/uL — ABNORMAL LOW (ref 4.22–5.81)
RDW: 13.3 % (ref 11.5–15.5)
WBC: 13.2 10*3/uL — ABNORMAL HIGH (ref 4.0–10.5)
nRBC: 0 % (ref 0.0–0.2)

## 2022-07-26 LAB — BASIC METABOLIC PANEL
Anion gap: 10 (ref 5–15)
BUN: 26 mg/dL — ABNORMAL HIGH (ref 8–23)
CO2: 18 mmol/L — ABNORMAL LOW (ref 22–32)
Calcium: 8.8 mg/dL — ABNORMAL LOW (ref 8.9–10.3)
Chloride: 103 mmol/L (ref 98–111)
Creatinine, Ser: 1.28 mg/dL — ABNORMAL HIGH (ref 0.61–1.24)
GFR, Estimated: 58 mL/min — ABNORMAL LOW (ref >60)
Glucose, Bld: 128 mg/dL — ABNORMAL HIGH (ref 70–99)
Potassium: 5.2 mmol/L — ABNORMAL HIGH (ref 3.5–5.1)
Sodium: 131 mmol/L — ABNORMAL LOW (ref 135–145)

## 2022-07-26 LAB — COOXEMETRY PANEL
Carboxyhemoglobin: 1.6 % — ABNORMAL HIGH (ref 0.5–1.5)
Methemoglobin: <0.7 % (ref 0.0–1.5)
O2 Saturation: 53.1 %
Total hemoglobin: 9.1 g/dL — ABNORMAL LOW (ref 12.0–16.0)

## 2022-07-26 LAB — GLUCOSE, CAPILLARY
Glucose-Capillary: 102 mg/dL — ABNORMAL HIGH (ref 70–99)
Glucose-Capillary: 89 mg/dL (ref 70–99)
Glucose-Capillary: 131 mg/dL — ABNORMAL HIGH (ref 70–99)
Glucose-Capillary: 95 mg/dL (ref 70–99)

## 2022-07-26 MED ORDER — ASPIRIN 81 MG PO TBEC
81.0000 mg | DELAYED_RELEASE_TABLET | Freq: Every day | ORAL | Status: DC
  Administered 2022-07-26 – 2022-08-01 (×7): 81 mg via ORAL
  Filled 2022-07-26 (×7): qty 1

## 2022-07-26 MED ORDER — METOCLOPRAMIDE HCL 5 MG/ML IJ SOLN
5.0000 mg | Freq: Three times a day (TID) | INTRAMUSCULAR | Status: DC | PRN
  Administered 2022-07-28: 5 mg via INTRAVENOUS
  Filled 2022-07-26 (×2): qty 2

## 2022-07-26 MED ORDER — MIDODRINE HCL 5 MG PO TABS
5.0000 mg | ORAL_TABLET | Freq: Three times a day (TID) | ORAL | Status: DC
  Administered 2022-07-26 – 2022-07-28 (×8): 5 mg via ORAL
  Filled 2022-07-26 (×8): qty 1

## 2022-07-26 MED ORDER — ALBUMIN HUMAN 5 % IV SOLN
INTRAVENOUS | Status: AC
  Administered 2022-07-27: 12.5 g via INTRAVENOUS
  Filled 2022-07-26: qty 250

## 2022-07-26 MED ORDER — METOCLOPRAMIDE HCL 5 MG/ML IJ SOLN: 5.0000 mg | Freq: Three times a day (TID) | INTRAMUSCULAR | Status: DC

## 2022-07-26 NOTE — Progress Notes (Signed)
2 Days Post-Op Procedure(s) (LRB): CORONARY ARTERY BYPASS GRAFTING (CABG) X5 USING LEFT INTERNAL MAMMARY ARTERY AND ENDOSCOPICALLY HARVESTED RIGHT GREATER SAPHENOUS VEIN (N/A) TRANSESOPHAGEAL ECHOCARDIOGRAM (N/A) CLIPPING OF ATRIAL APPENDAGE USING ATRICURE ATRICLIP SIZE (Left) RIGHT CORONARY ENDARTERECETOMY (Right) Subjective: Up in chair.  Some incisional pain, mild nausea, no abdominal pain  Objective: Vital signs in last 24 hours: Temp:  [97.7 F (36.5 C)-99.5 F (37.5 C)] 98.7 F (37.1 C) (05/10 0317) Pulse Rate:  [30-96] 82 (05/10 0700) Cardiac Rhythm: A-V Sequential paced (05/09 2000) Resp:  [15-29] 22 (05/10 0700) BP: (89-101)/(67-74) 95/74 (05/09 1300) SpO2:  [89 %-100 %] 98 % (05/10 0700) Arterial Line BP: (94-147)/(45-86) 123/53 (05/10 0726) Weight:  [76.3 kg] 76.3 kg (05/10 0500)  Hemodynamic parameters for last 24 hours: PAP: (13-26)/(10-22) 26/22 CVP:  [1 mmHg-12 mmHg] 12 mmHg  Intake/Output from previous day: 05/09 0701 - 05/10 0700 In: 1879.1 [P.O.:60; I.V.:1451.9; IV Piggyback:367.2] Out: 620 [Urine:620] Intake/Output this shift: No intake/output data recorded.  General appearance: alert, cooperative, and no distress Neurologic: intact Heart: regular rate and rhythm Lungs: diminished breath sounds bibasilar Abdomen: normal findings: soft, non-tender  Lab Results: Recent Labs    07/25/22 1632 07/26/22 0335  WBC 10.7* 13.2*  HGB 9.3* 9.6*  HCT 28.7* 27.7*  PLT 158 154   BMET:  Recent Labs    07/25/22 1632 07/26/22 0335  NA 132* 131*  K 5.1 5.2*  CL 107 103  CO2 17* 18*  GLUCOSE 192* 128*  BUN 22 26*  CREATININE 1.34* 1.28*  CALCIUM 8.3* 8.8*    PT/INR:  Recent Labs    07/24/22 1425  LABPROT 17.4*  INR 1.4*   ABG    Component Value Date/Time   PHART 7.345 (L) 07/24/2022 2135   HCO3 19.2 (L) 07/24/2022 2135   TCO2 20 (L) 07/24/2022 2135   ACIDBASEDEF 6.0 (H) 07/24/2022 2135   O2SAT 53.1 07/26/2022 0540   CBG (last 3)   Recent Labs    07/25/22 1602 07/25/22 2129 07/26/22 0559  GLUCAP 152* 157* 102*    Assessment/Plan: S/P Procedure(s) (LRB): CORONARY ARTERY BYPASS GRAFTING (CABG) X5 USING LEFT INTERNAL MAMMARY ARTERY AND ENDOSCOPICALLY HARVESTED RIGHT GREATER SAPHENOUS VEIN (N/A) TRANSESOPHAGEAL ECHOCARDIOGRAM (N/A) CLIPPING OF ATRIAL APPENDAGE USING ATRICURE ATRICLIP SIZE (Left) RIGHT CORONARY ENDARTERECETOMY (Right) POD # 2 NEURO- intact CV- in Sr at 80.   BP still relatively low and still on neo drip  Start midodrine, wean neo, hold off on ACE-I until BP resolved  ASA to 81 mg, resume Eliquis in a day or 2 after pacing wires out  Lipitor RESP_ some LLL atelectasis +/- small effusion RENAL- creatinine mildly elevated c/w acute kidney injury  Monitor ENDO- CBG mildly elevated, continue SSI Ac and HS Gi- advance diet Anemia secondary to ABL- mild, follow Ambulating well SCD + enoxaparin until Eliquis resumed   LOS: 5 days    [name] 07/26/2022

## 2022-07-26 NOTE — Progress Notes (Signed)
Patient ID: Walter George, male   DOB: Jun 17, 1945, 77 y.o.   MRN: [id number]  TCTS Afternoon rounds:  Walter George is off. BP 90-100 systolic. MAP 60.  On midodrine 5 tid. Hold Lopressor for now. Sinus rhythm 75  UO 30/hr.  Sitting up in chair.

## 2022-07-27 DIAGNOSIS — I200 Unstable angina: Secondary | ICD-10-CM | POA: Diagnosis not present

## 2022-07-27 LAB — BASIC METABOLIC PANEL
Anion gap: 10 (ref 5–15)
BUN: 47 mg/dL — ABNORMAL HIGH (ref 8–23)
CO2: 19 mmol/L — ABNORMAL LOW (ref 22–32)
Calcium: 8.3 mg/dL — ABNORMAL LOW (ref 8.9–10.3)
Chloride: 101 mmol/L (ref 98–111)
Creatinine, Ser: 1.65 mg/dL — ABNORMAL HIGH (ref 0.61–1.24)
GFR, Estimated: 43 mL/min — ABNORMAL LOW (ref >60)
Glucose, Bld: 101 mg/dL — ABNORMAL HIGH (ref 70–99)
Potassium: 4.6 mmol/L (ref 3.5–5.1)
Sodium: 130 mmol/L — ABNORMAL LOW (ref 135–145)

## 2022-07-27 LAB — CBC
HCT: 25.8 % — ABNORMAL LOW (ref 39.0–52.0)
Hemoglobin: 8.6 g/dL — ABNORMAL LOW (ref 13.0–17.0)
MCH: 31 pg (ref 26.0–34.0)
MCHC: 33.3 g/dL (ref 30.0–36.0)
MCV: 93.1 fL (ref 80.0–100.0)
Platelets: 155 10*3/uL (ref 150–400)
RBC: 2.77 MIL/uL — ABNORMAL LOW (ref 4.22–5.81)
RDW: 13.4 % (ref 11.5–15.5)
WBC: 10.6 10*3/uL — ABNORMAL HIGH (ref 4.0–10.5)
nRBC: 0 % (ref 0.0–0.2)

## 2022-07-27 LAB — GLUCOSE, CAPILLARY
Glucose-Capillary: 85 mg/dL (ref 70–99)
Glucose-Capillary: 111 mg/dL — ABNORMAL HIGH (ref 70–99)
Glucose-Capillary: 102 mg/dL — ABNORMAL HIGH (ref 70–99)
Glucose-Capillary: 91 mg/dL (ref 70–99)

## 2022-07-27 MED ORDER — APIXABAN 5 MG PO TABS
5.0000 mg | ORAL_TABLET | Freq: Two times a day (BID) | ORAL | Status: DC
  Administered 2022-07-27 – 2022-08-01 (×10): 5 mg via ORAL
  Filled 2022-07-27 (×10): qty 1

## 2022-07-27 MED ORDER — FUROSEMIDE 10 MG/ML IJ SOLN
40.0000 mg | Freq: Once | INTRAMUSCULAR | Status: AC
  Administered 2022-07-27: 40 mg via INTRAVENOUS
  Filled 2022-07-27: qty 4

## 2022-07-27 MED ORDER — FE FUM-VIT C-VIT B12-FA 460-60-0.01-1 MG PO CAPS
1.0000 | ORAL_CAPSULE | Freq: Every day | ORAL | Status: DC
  Administered 2022-07-27 – 2022-07-28 (×2): 1 via ORAL
  Filled 2022-07-27 (×3): qty 1

## 2022-07-27 MED ORDER — ALBUMIN HUMAN 5 % IV SOLN: 12.5000 g | Freq: Once | INTRAVENOUS | Status: AC

## 2022-07-27 NOTE — Progress Notes (Signed)
Patient ID: Walter George, male   DOB: 1945/12/31, 77 y.o.   MRN: [id number]     Advanced Heart Failure Rounding Note  PCP-Cardiologist: None   Subjective:    S/p CABG, post OP day # 2 Phenylephrine at today Doing very well with no complaints. Weaning phenylephrine.   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   Weight Range: 77.4 kg Body mass index is 25.2 kg/m.   Vital Signs:   Temp:  [97.7 F (36.5 C)-98.1 F (36.7 C)] 98.1 F (36.7 C) (05/10 1947) Pulse Rate:  [74-86] 75 (05/10 1330) Resp:  [17-29] 25 (05/11 0630) BP: (82-118)/(54-77) 116/77 (05/11 0630) SpO2:  [95 %-99 %] 98 % (05/11 0500) Arterial Line BP: (82-117)/(49-65) 112/61 (05/10 1413) Weight:  [77.4 kg] 77.4 kg (05/11 0500) Last BM Date :  (prior to surgery)  Weight change: Filed Weights   07/25/22 0500 07/26/22 0500 07/27/22 0500  Weight: 75.1 kg 76.3 kg 77.4 kg   Intake/Output:   Intake/Output Summary (Last 24 hours) at 07/27/2022 0954 Last data filed at 07/27/2022 0600 Gross per 24 hour  Intake 462.41 ml  Output 505 ml  Net -42.59 ml     Physical Exam  General:  Lying comfortably in bed. HEENT: normal Neck: supple. no JVD. Carotids 2+ bilat; no bruits. R IJ introducer Cor: PMI nondisplaced. Regular rate & rhythm. No rubs, gallops or murmurs. Dressing over sternal incision Lungs: CTA B/L Abdomen: soft, nontender, nondistended. No hepatosplenomegaly.  Extremities: no cyanosis, clubbing, rash, edema Neuro: alert & orientedx3. Affect pleasant  Telemetry  Sinus in 80s Labs    CBC Recent Labs    07/26/22 0335 07/27/22 0224  WBC 13.2* 10.6*  HGB 9.6* 8.6*  HCT 27.7* 25.8*  MCV 91.7 93.1  PLT 154 155    Basic Metabolic Panel Recent Labs    30/86/57 0407 07/25/22 1632 07/26/22 0335 07/27/22 0224  NA 135 132* 131* 130*  K 5.3* 5.1 5.2* 4.6  CL 107 107 103 101  CO2 20* 17* 18* 19*  GLUCOSE 113* 192*  128* 101*  BUN 15 22 26* 47*  CREATININE 1.08 1.34* 1.28* 1.65*  CALCIUM 7.9* 8.3* 8.8* 8.3*  MG 2.1 2.4  --   --     Liver Function Tests No results for input(s): "AST", "ALT", "ALKPHOS", "BILITOT", "PROT", "ALBUMIN" in the last 72 hours. No results for input(s): "LIPASE", "AMYLASE" in the last 72 hours. Cardiac Enzymes No results for input(s): "CKTOTAL", "CKMB", "CKMBINDEX", "TROPONINI" in the last 72 hours.  BNP: BNP (last 3 results) Recent Labs    02/27/22 1651  BNP 68.6       Imaging    No results found.   Medications:     Scheduled Medications:  acetaminophen  1,000 mg Oral Q6H   Or   acetaminophen (TYLENOL) oral liquid 160 mg/5 mL  1,000 mg Per Tube Q6H   apixaban  5 mg Oral BID   aspirin EC  81 mg Oral Daily   atorvastatin  80 mg Oral Daily   bisacodyl  10 mg Oral Daily   Or   bisacodyl  10 mg Rectal Daily   Chlorhexidine Gluconate Cloth  6 each Topical Daily   docusate sodium  200 mg Oral Daily   ezetimibe  10 mg Oral Daily   Fe Fum-Vit C-Vit B12-FA  1 capsule Oral QPC breakfast   midodrine  5 mg Oral TID WC  pantoprazole  40 mg Oral Daily   sodium chloride flush  3 mL Intravenous Q12H    Infusions:  sodium chloride     lactated ringers Stopped (07/25/22 0930)   phenylephrine (NEO-SYNEPHRINE) Adult infusion Stopped (07/26/22 1206)    PRN Medications: metoCLOPramide (REGLAN) injection, metoprolol tartrate, ondansetron (ZOFRAN) IV, oxyCODONE, sodium chloride flush, traMADol   Assessment/Plan  1. CAD:  On 5/3, he developed pleuritic chest pain and came to the ER.  HS-TnI 1400=>1065.  Cath was done, showing severe 3 vessel disease with 95% mRCA, 90% ostial LCx, 99% ramus, 100% D1 with collaterals, 70% prox-mid LAD with abnormal RFR.  No vessel looked like acute plaque rupture.  Discussed with Dr. Lalla Brothers, suspect the pleuritic chest pain and TnI elevation were due to the AF ablation and not ACS.   - Echo 5/5: EF 55-60%, normal RV, trivial MR/TR,  Iatrogenic ASD is present with all L>R shunting.  - S/p CABG X 5 05/08. - Continue aspirin and statin.  - CVP 7; hold further diuretics. Continue to wean phenylephrine. Likely post-operative vasoplegia.   2. Atrial fibrillation: Paroxysmal, s/p ablation on 5/2.  He is in NSR now.  - Left atrial appendage clipped at time of CABG - Risk for recurrence of AF perioperatively, Continue amiodarone 200 mg bid and continue until a month or so post-op if he remains in NSR.   - Anticoagulation when okay with TCTS  3. Hyperkalemia - stable - Primary team correcting  4. Small right PTX   Length of Stay: 6  Jadin Creque, DO  07/27/2022, 9:54 AM  Advanced Heart Failure Team Pager [phone] (M-F; 7a - 5p)  Please contact [facility] for night-coverage after hours (5p -7a ) and weekends on [email or web address]  CRITICAL CARE Performed by: Dorthula Nettles   Total critical care time: 40 minutes  Critical care time was exclusive of separately billable procedures and treating other patients.  Critical care was necessary to treat or prevent imminent or life-threatening deterioration.  Critical care was time spent personally by me on the following activities: development of treatment plan with patient and/or surrogate as well as nursing, discussions with consultants, evaluation of patient's response to treatment, examination of patient, obtaining history from patient or surrogate, ordering and performing treatments and interventions, ordering and review of laboratory studies, ordering and review of radiographic studies, pulse oximetry and re-evaluation of patient's condition.

## 2022-07-27 NOTE — Progress Notes (Signed)
Patient ID: Walter George, male   DOB: 1945-11-18, 77 y.o.   MRN: [id number] TCTS Evening Rounds:  Hemodynamically stable in sinus rhythm. Ambulating well. Repeat labs in am.

## 2022-07-27 NOTE — Progress Notes (Signed)
Patient ID: Walter George, male   DOB: 1945-07-25, 77 y.o.   MRN: [id number]     Advanced Heart Failure Rounding Note  PCP-Cardiologist: None   Subjective:    - Off pressors - Symptomatically, doing very well. No complaints. Ambulating w/o difficulty.  - 570cc urine output yesterday, rise in sCr today.   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   Weight Range: 77.4 kg Body mass index is 25.2 kg/m.   Vital Signs:   Temp:  [97.7 F (36.5 C)-98.1 F (36.7 C)] 98.1 F (36.7 C) (05/10 1947) Pulse Rate:  [74-86] 75 (05/10 1330) Resp:  [17-29] 25 (05/11 0630) BP: (82-118)/(54-77) 116/77 (05/11 0630) SpO2:  [95 %-99 %] 98 % (05/11 0500) Arterial Line BP: (82-117)/(49-65) 112/61 (05/10 1413) Weight:  [77.4 kg] 77.4 kg (05/11 0500) Last BM Date :  (prior to surgery)  Weight change: Filed Weights   07/25/22 0500 07/26/22 0500 07/27/22 0500  Weight: 75.1 kg 76.3 kg 77.4 kg   Intake/Output:   Intake/Output Summary (Last 24 hours) at 07/27/2022 1034 Last data filed at 07/27/2022 0600 Gross per 24 hour  Intake 462.41 ml  Output 505 ml  Net -42.59 ml     Physical Exam  General:  Lying comfortably in bed. HEENT: normal Neck: supple. JVP 7cm Cor: PMI nondisplaced. Regular rate & rhythm. No rubs, gallops or murmurs. Dressing over sternal incision Lungs: CTA B/L Abdomen: soft, nontender, nondistended. No hepatosplenomegaly.  Extremities: no cyanosis, clubbing, rash, edema Neuro: alert & orientedx3. Affect pleasant  Telemetry  Sinus in 80s Labs    CBC Recent Labs    07/26/22 0335 07/27/22 0224  WBC 13.2* 10.6*  HGB 9.6* 8.6*  HCT 27.7* 25.8*  MCV 91.7 93.1  PLT 154 155    Basic Metabolic Panel Recent Labs    78/29/56 0407 07/25/22 1632 07/26/22 0335 07/27/22 0224  NA 135 132* 131* 130*  K 5.3* 5.1 5.2* 4.6  CL 107 107 103 101  CO2 20* 17* 18* 19*  GLUCOSE 113* 192* 128* 101*  BUN  15 22 26* 47*  CREATININE 1.08 1.34* 1.28* 1.65*  CALCIUM 7.9* 8.3* 8.8* 8.3*  MG 2.1 2.4  --   --     Liver Function Tests No results for input(s): "AST", "ALT", "ALKPHOS", "BILITOT", "PROT", "ALBUMIN" in the last 72 hours. No results for input(s): "LIPASE", "AMYLASE" in the last 72 hours. Cardiac Enzymes No results for input(s): "CKTOTAL", "CKMB", "CKMBINDEX", "TROPONINI" in the last 72 hours.  BNP: BNP (last 3 results) Recent Labs    02/27/22 1651  BNP 68.6       Imaging    No results found.   Medications:     Scheduled Medications:  acetaminophen  1,000 mg Oral Q6H   Or   acetaminophen (TYLENOL) oral liquid 160 mg/5 mL  1,000 mg Per Tube Q6H   apixaban  5 mg Oral BID   aspirin EC  81 mg Oral Daily   atorvastatin  80 mg Oral Daily   bisacodyl  10 mg Oral Daily   Or   bisacodyl  10 mg Rectal Daily   Chlorhexidine Gluconate Cloth  6 each Topical Daily   docusate sodium  200 mg Oral Daily   ezetimibe  10 mg Oral Daily   Fe Fum-Vit C-Vit B12-FA  1 capsule Oral QPC breakfast   midodrine  5 mg Oral TID WC   pantoprazole  40 mg  Oral Daily   sodium chloride flush  3 mL Intravenous Q12H    Infusions:  sodium chloride     lactated ringers Stopped (07/25/22 0930)   phenylephrine (NEO-SYNEPHRINE) Adult infusion Stopped (07/26/22 1206)    PRN Medications: metoCLOPramide (REGLAN) injection, metoprolol tartrate, ondansetron (ZOFRAN) IV, oxyCODONE, sodium chloride flush, traMADol   Assessment/Plan  1. CAD:  On 5/3, he developed pleuritic chest pain and came to the ER.  HS-TnI 1400=>1065.  Cath was done, showing severe 3 vessel disease with 95% mRCA, 90% ostial LCx, 99% ramus, 100% D1 with collaterals, 70% prox-mid LAD with abnormal RFR.  No vessel looked like acute plaque rupture.  Discussed with Dr. Lalla Brothers, suspect the pleuritic chest pain and TnI elevation were due to the AF ablation and not ACS.   - Echo 5/5: EF 55-60%, normal RV, trivial MR/TR, Iatrogenic ASD is  present with all L>R shunting.  - S/p CABG X 5 05/08. - Continue aspirin and statin.  - Rise in sCr overnight likely secondary to vasoplegia/ATN after surgery. Will restart CVP monitoring today. Hold diuretics. Plan for IVFs according to CVP alter today.   2. Atrial fibrillation: Paroxysmal, s/p ablation on 5/2.  He is in NSR now.  - Left atrial appendage clipped at time of CABG - Risk for recurrence of AF perioperatively, Continue amiodarone 200 mg bid and continue until a month or so post-op if he remains in NSR.   - Anticoagulation when okay with TCTS -NSR in 80s today  3. Hyperkalemia - stable - Primary team correcting  4. Small right PTX   Length of Stay: 6  Walter Antonini, DO  07/27/2022, 10:34 AM  Advanced Heart Failure Team Pager [phone] (M-F; 7a - 5p)  Please contact [facility] for night-coverage after hours (5p -7a ) and weekends on [email or web address]

## 2022-07-27 NOTE — Progress Notes (Signed)
PMW x4 d/c'd without complications.  VSS.  Pt bedrest x1 hr.

## 2022-07-27 NOTE — Progress Notes (Signed)
3 Days Post-Op Procedure(s) (LRB): CORONARY ARTERY BYPASS GRAFTING (CABG) X5 USING LEFT INTERNAL MAMMARY ARTERY AND ENDOSCOPICALLY HARVESTED RIGHT GREATER SAPHENOUS VEIN (N/A) TRANSESOPHAGEAL ECHOCARDIOGRAM (N/A) CLIPPING OF ATRIAL APPENDAGE USING ATRICURE ATRICLIP SIZE (Left) RIGHT CORONARY ENDARTERECETOMY (Right) Subjective: No specific complaints.   Objective: Vital signs in last 24 hours: Temp:  [97.7 F (36.5 C)-98.1 F (36.7 C)] 98.1 F (36.7 C) (05/10 1947) Pulse Rate:  [74-86] 75 (05/10 1330) Cardiac Rhythm: Normal sinus rhythm (05/11 0335) Resp:  [17-29] 25 (05/11 0630) BP: (82-118)/(54-77) 116/77 (05/11 0630) SpO2:  [93 %-99 %] 98 % (05/11 0500) Arterial Line BP: (82-117)/(49-65) 112/61 (05/10 1413) Weight:  [77.4 kg] 77.4 kg (05/11 0500)  Hemodynamic parameters for last 24 hours: CVP:  [8 mmHg-9 mmHg] 8 mmHg  Intake/Output from previous day: 05/10 0701 - 05/11 0700 In: 581.5 [I.V.:317.9; IV Piggyback:263.6] Out: 570 [Urine:570] Intake/Output this shift: No intake/output data recorded.  General appearance: alert and cooperative Neurologic: intact Heart: regular rate and rhythm Lungs: clear to auscultation bilaterally Extremities: edema minimal Wound: incision healing well  Lab Results: Recent Labs    07/26/22 0335 07/27/22 0224  WBC 13.2* 10.6*  HGB 9.6* 8.6*  HCT 27.7* 25.8*  PLT 154 155   BMET:  Recent Labs    07/26/22 0335 07/27/22 0224  NA 131* 130*  K 5.2* 4.6  CL 103 101  CO2 18* 19*  GLUCOSE 128* 101*  BUN 26* 47*  CREATININE 1.28* 1.65*  CALCIUM 8.8* 8.3*    PT/INR:  Recent Labs    07/24/22 1425  LABPROT 17.4*  INR 1.4*   ABG    Component Value Date/Time   PHART 7.345 (L) 07/24/2022 2135   HCO3 19.2 (L) 07/24/2022 2135   TCO2 20 (L) 07/24/2022 2135   ACIDBASEDEF 6.0 (H) 07/24/2022 2135   O2SAT 53.1 07/26/2022 0540   CBG (last 3)  Recent Labs    07/26/22 1543 07/26/22 2117 07/27/22 0617  GLUCAP 131* 95 85     Assessment/Plan: S/P Procedure(s) (LRB): CORONARY ARTERY BYPASS GRAFTING (CABG) X5 USING LEFT INTERNAL MAMMARY ARTERY AND ENDOSCOPICALLY HARVESTED RIGHT GREATER SAPHENOUS VEIN (N/A) TRANSESOPHAGEAL ECHOCARDIOGRAM (N/A) CLIPPING OF ATRIAL APPENDAGE USING ATRICURE ATRICLIP SIZE (Left) RIGHT CORONARY ENDARTERECETOMY (Right)  POD 3 Hemodynamically stable on midodrine 5 tid. Holding off on beta blocker for now.  S/p recent ablation for atrial fib. Stable sinus 76. Will remove pacing wires this am and plan to start Eliquis this pm.  AKI with creat up to 1.65 this am. Probably SIRS reaction with vasoplegia. Will hold off on diuretic. Expect this should resolve quickly.  Continue IS, ambulation.  Glucose under good control: no hx of DM and normal preop Hgb A1c. DC CBG's and SSI.   LOS: 6 days    Walter George 07/27/2022

## 2022-07-28 DIAGNOSIS — I200 Unstable angina: Secondary | ICD-10-CM | POA: Diagnosis not present

## 2022-07-28 LAB — BASIC METABOLIC PANEL
Anion gap: 11 (ref 5–15)
BUN: 49 mg/dL — ABNORMAL HIGH (ref 8–23)
CO2: 19 mmol/L — ABNORMAL LOW (ref 22–32)
Calcium: 8 mg/dL — ABNORMAL LOW (ref 8.9–10.3)
Chloride: 103 mmol/L (ref 98–111)
Creatinine, Ser: 1.58 mg/dL — ABNORMAL HIGH (ref 0.61–1.24)
GFR, Estimated: 45 mL/min — ABNORMAL LOW (ref >60)
Glucose, Bld: 93 mg/dL (ref 70–99)
Potassium: 3.7 mmol/L (ref 3.5–5.1)
Sodium: 133 mmol/L — ABNORMAL LOW (ref 135–145)

## 2022-07-28 LAB — CBC
HCT: 27.3 % — ABNORMAL LOW (ref 39.0–52.0)
Hemoglobin: 8.9 g/dL — ABNORMAL LOW (ref 13.0–17.0)
MCH: 30.2 pg (ref 26.0–34.0)
MCHC: 32.6 g/dL (ref 30.0–36.0)
MCV: 92.5 fL (ref 80.0–100.0)
Platelets: 251 10*3/uL (ref 150–400)
RBC: 2.95 MIL/uL — ABNORMAL LOW (ref 4.22–5.81)
RDW: 13.4 % (ref 11.5–15.5)
WBC: 10.1 10*3/uL (ref 4.0–10.5)
nRBC: 0.2 % (ref 0.0–0.2)

## 2022-07-28 LAB — GLUCOSE, CAPILLARY
Glucose-Capillary: 79 mg/dL (ref 70–99)
Glucose-Capillary: 89 mg/dL (ref 70–99)

## 2022-07-28 MED ORDER — ACETAMINOPHEN 500 MG PO TABS: 1000.0000 mg | ORAL_TABLET | Freq: Once | ORAL | Status: DC

## 2022-07-28 MED ORDER — SORBITOL 70 % SOLN
30.0000 mL | Freq: Once | Status: AC
  Administered 2022-07-28: 30 mL via ORAL
  Filled 2022-07-28: qty 30

## 2022-07-28 MED ORDER — LACTULOSE 10 GM/15ML PO SOLN
20.0000 g | Freq: Once | ORAL | Status: AC
  Administered 2022-07-28: 20 g via ORAL
  Filled 2022-07-28: qty 30

## 2022-07-28 MED ORDER — SODIUM CHLORIDE 0.9% FLUSH: 3.0000 mL | INTRAVENOUS | Status: DC | PRN

## 2022-07-28 MED ORDER — SODIUM CHLORIDE 0.9% FLUSH
3.0000 mL | Freq: Two times a day (BID) | INTRAVENOUS | Status: DC
  Administered 2022-07-28 – 2022-08-01 (×8): 3 mL via INTRAVENOUS

## 2022-07-28 MED ORDER — SODIUM CHLORIDE 0.9 % IV SOLN: 250.0000 mL | INTRAVENOUS | Status: DC | PRN

## 2022-07-28 MED ORDER — FUROSEMIDE 10 MG/ML IJ SOLN
40.0000 mg | Freq: Once | INTRAMUSCULAR | Status: AC
  Administered 2022-07-28: 40 mg via INTRAVENOUS
  Filled 2022-07-28: qty 4

## 2022-07-28 MED ORDER — IPRATROPIUM-ALBUTEROL 0.5-2.5 (3) MG/3ML IN SOLN: 3.0000 mL | Freq: Four times a day (QID) | RESPIRATORY_TRACT | Status: DC | PRN

## 2022-07-28 MED ORDER — ~~LOC~~ CARDIAC SURGERY, PATIENT & FAMILY EDUCATION: Freq: Once | Status: AC

## 2022-07-28 MED ORDER — POTASSIUM CHLORIDE CRYS ER 20 MEQ PO TBCR
20.0000 meq | EXTENDED_RELEASE_TABLET | ORAL | Status: DC
  Administered 2022-07-28 (×2): 20 meq via ORAL
  Filled 2022-07-28 (×2): qty 1

## 2022-07-28 MED ORDER — POLYETHYLENE GLYCOL 3350 17 G PO PACK
17.0000 g | PACK | Freq: Every day | ORAL | Status: DC
  Administered 2022-07-29 – 2022-08-01 (×3): 17 g via ORAL
  Filled 2022-07-28 (×4): qty 1

## 2022-07-28 MED ORDER — SENNA 8.6 MG PO TABS
2.0000 | ORAL_TABLET | Freq: Every day | ORAL | Status: DC
  Administered 2022-07-28 – 2022-07-31 (×2): 17.2 mg via ORAL
  Filled 2022-07-28 (×3): qty 2

## 2022-07-28 NOTE — Progress Notes (Signed)
4 Days Post-Op Procedure(s) (LRB): CORONARY ARTERY BYPASS GRAFTING (CABG) X5 USING LEFT INTERNAL MAMMARY ARTERY AND ENDOSCOPICALLY HARVESTED RIGHT GREATER SAPHENOUS VEIN (N/A) TRANSESOPHAGEAL ECHOCARDIOGRAM (N/A) CLIPPING OF ATRIAL APPENDAGE USING ATRICURE ATRICLIP SIZE (Left) RIGHT CORONARY ENDARTERECETOMY (Right) Subjective:  Only complaint is constipation  Objective: Vital signs in last 24 hours: Temp:  [97.7 F (36.5 C)-98.3 F (36.8 C)] 98.1 F (36.7 C) (05/12 0801) Pulse Rate:  [73-79] 78 (05/12 0600) Cardiac Rhythm: Normal sinus rhythm (05/12 0400) Resp:  [12-31] 21 (05/12 0600) BP: (94-122)/(60-98) 110/74 (05/12 0600) SpO2:  [74 %-98 %] 97 % (05/12 0600) Weight:  [76.3 kg] 76.3 kg (05/12 0700)  Hemodynamic parameters for last 24 hours: CVP:  [2 mmHg-69 mmHg] 6 mmHg  Intake/Output from previous day: 05/11 0701 - 05/12 0700 In: 240 [P.O.:240] Out: 1550 [Urine:1550] Intake/Output this shift: No intake/output data recorded.  General appearance: alert and cooperative Neurologic: intact Heart: regular rate and rhythm Lungs: clear to auscultation bilaterally Extremities: no edema Wound: incision healing well  Lab Results: Recent Labs    07/27/22 0224 07/28/22 0404  WBC 10.6* 10.1  HGB 8.6* 8.9*  HCT 25.8* 27.3*  PLT 155 251   BMET:  Recent Labs    07/27/22 0224 07/28/22 0404  NA 130* 133*  K 4.6 3.7  CL 101 103  CO2 19* 19*  GLUCOSE 101* 93  BUN 47* 49*  CREATININE 1.65* 1.58*  CALCIUM 8.3* 8.0*    PT/INR: No results for input(s): "LABPROT", "INR" in the last 72 hours. ABG    Component Value Date/Time   PHART 7.345 (L) 07/24/2022 2135   HCO3 19.2 (L) 07/24/2022 2135   TCO2 20 (L) 07/24/2022 2135   ACIDBASEDEF 6.0 (H) 07/24/2022 2135   O2SAT 53.1 07/26/2022 0540   CBG (last 3)  Recent Labs    07/27/22 1638 07/27/22 2120 07/28/22 0655  GLUCAP 102* 91 79    Assessment/Plan: S/P Procedure(s) (LRB): CORONARY ARTERY BYPASS GRAFTING  (CABG) X5 USING LEFT INTERNAL MAMMARY ARTERY AND ENDOSCOPICALLY HARVESTED RIGHT GREATER SAPHENOUS VEIN (N/A) TRANSESOPHAGEAL ECHOCARDIOGRAM (N/A) CLIPPING OF ATRIAL APPENDAGE USING ATRICURE ATRICLIP SIZE (Left) RIGHT CORONARY ENDARTERECETOMY (Right)  POD 4  Hemodynamically stable on midodrine 5 tid. Will continue today. Hold beta blocker until off midodrine.  Volume excess: -1300 cc yesterday. Wt down 2.5 lbs. Still a couple lbs over preop but no edema.  AKI: creat improving. Follow.  Lactulose today for constipation  DC sleeve and foley  Transfer to 4E. Continue IS, ambulation.  Home when bowels move.   LOS: 7 days    Walter George 07/28/2022

## 2022-07-28 NOTE — Progress Notes (Signed)
Patient ID: Walter George, male   DOB: 1945-06-07, 77 y.o.   MRN: [id number]     Advanced Heart Failure Rounding Note  PCP-Cardiologist: None   Subjective:    - Off pressors - ambulating w/o difficulty - 1.6L urine output overnight; improvement in sCr. Feels fatigued.   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   Weight Range: 76.3 kg Body mass index is 24.84 kg/m.   Vital Signs:   Temp:  [97.7 F (36.5 C)-98.3 F (36.8 C)] 98.1 F (36.7 C) (05/12 0801) Pulse Rate:  [73-79] 78 (05/12 0600) Resp:  [12-31] 21 (05/12 0600) BP: (94-122)/(61-98) 110/74 (05/12 0600) SpO2:  [74 %-98 %] 97 % (05/12 0600) Weight:  [76.3 kg] 76.3 kg (05/12 0700) Last BM Date :  (pta)  Weight change: Filed Weights   07/26/22 0500 07/27/22 0500 07/28/22 0700  Weight: 76.3 kg 77.4 kg 76.3 kg   Intake/Output:   Intake/Output Summary (Last 24 hours) at 07/28/2022 1000 Last data filed at 07/28/2022 0600 Gross per 24 hour  Intake 240 ml  Output 1550 ml  Net -1310 ml     Physical Exam  General:  Lying comfortably in bed. HEENT: normal Neck: supple. JVP 10-11 Cor: PMI nondisplaced. Regular rate & rhythm. No rubs, gallops or murmurs. Dressing over sternal incision Lungs: CTA B/L Abdomen: soft, nontender, nondistended. No hepatosplenomegaly.  Extremities: no cyanosis, clubbing, rash, edema Neuro: alert & orientedx3. Affect pleasant  Telemetry  Sinus in 80s Labs    CBC Recent Labs    07/27/22 0224 07/28/22 0404  WBC 10.6* 10.1  HGB 8.6* 8.9*  HCT 25.8* 27.3*  MCV 93.1 92.5  PLT 155 251    Basic Metabolic Panel Recent Labs    40/98/11 1632 07/26/22 0335 07/27/22 0224 07/28/22 0404  NA 132*   < > 130* 133*  K 5.1   < > 4.6 3.7  CL 107   < > 101 103  CO2 17*   < > 19* 19*  GLUCOSE 192*   < > 101* 93  BUN 22   < > 47* 49*  CREATININE 1.34*   < > 1.65* 1.58*  CALCIUM 8.3*   < > 8.3* 8.0*  MG 2.4  --    --   --    < > = values in this interval not displayed.    Liver Function Tests No results for input(s): "AST", "ALT", "ALKPHOS", "BILITOT", "PROT", "ALBUMIN" in the last 72 hours. No results for input(s): "LIPASE", "AMYLASE" in the last 72 hours. Cardiac Enzymes No results for input(s): "CKTOTAL", "CKMB", "CKMBINDEX", "TROPONINI" in the last 72 hours.  BNP: BNP (last 3 results) Recent Labs    02/27/22 1651  BNP 68.6       Imaging    No results found.   Medications:     Scheduled Medications:  acetaminophen  1,000 mg Oral Q6H   Or   acetaminophen (TYLENOL) oral liquid 160 mg/5 mL  1,000 mg Per Tube Q6H   apixaban  5 mg Oral BID   aspirin EC  81 mg Oral Daily   atorvastatin  80 mg Oral Daily   bisacodyl  10 mg Oral Daily   Or   bisacodyl  10 mg Rectal Daily   Chlorhexidine Gluconate Cloth  6 each Topical Daily   docusate sodium  200 mg Oral Daily   ezetimibe  10 mg Oral Daily   Fe Fum-Vit C-Vit  B12-FA  1 capsule Oral QPC breakfast   furosemide  40 mg Intravenous Once   lactulose  20 g Oral Once   midodrine  5 mg Oral TID WC   pantoprazole  40 mg Oral Daily   potassium chloride  20 mEq Oral Q4H   sodium chloride flush  3 mL Intravenous Q12H    Infusions:  sodium chloride      PRN Medications: metoCLOPramide (REGLAN) injection, metoprolol tartrate, ondansetron (ZOFRAN) IV, oxyCODONE, sodium chloride flush, traMADol   Assessment/Plan  1. CAD:  On 5/3, he developed pleuritic chest pain and came to the ER.  HS-TnI 1400=>1065.  Cath was done, showing severe 3 vessel disease with 95% mRCA, 90% ostial LCx, 99% ramus, 100% D1 with collaterals, 70% prox-mid LAD with abnormal RFR.  No vessel looked like acute plaque rupture.  Discussed with Dr. Lalla Brothers, suspect the pleuritic chest pain and TnI elevation were due to the AF ablation and not ACS.   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  - S/p CABG X 5  05/08. - Continue aspirin and statin.  - Bedside U/S yesterday w/ IVC 2.5cm, CVP 11. Improvement in sCr after IV lasix 40mg . JVP elevated today. Will repeat IV lasix 40mg  once more.   2. Atrial fibrillation: Paroxysmal, s/p ablation on 5/2.  He is in NSR now.  - Left atrial appendage clipped at time of CABG - Risk for recurrence of AF perioperatively, Continue amiodarone 200 mg bid and continue until a month or so post-op if he remains in NSR.   - Anticoagulation when okay with TCTS - NSR 80s-90s.  3. Hyperkalemia - stable - Primary team correcting  4. Small right PTX   Length of Stay: 7  Marqual Mi, DO  07/28/2022, 10:00 AM  Advanced Heart Failure Team Pager [phone] (M-F; 7a - 5p)  Please contact [facility] for night-coverage after hours (5p -7a ) and weekends on [email or web address]

## 2022-07-29 DIAGNOSIS — I5031 Acute diastolic (congestive) heart failure: Secondary | ICD-10-CM

## 2022-07-29 LAB — CBC
HCT: 26.4 % — ABNORMAL LOW (ref 39.0–52.0)
Hemoglobin: 8.8 g/dL — ABNORMAL LOW (ref 13.0–17.0)
MCH: 30.7 pg (ref 26.0–34.0)
MCHC: 33.3 g/dL (ref 30.0–36.0)
MCV: 92 fL (ref 80.0–100.0)
Platelets: 281 10*3/uL (ref 150–400)
RBC: 2.87 MIL/uL — ABNORMAL LOW (ref 4.22–5.81)
RDW: 13.2 % (ref 11.5–15.5)
WBC: 8.8 10*3/uL (ref 4.0–10.5)
nRBC: 0.5 % — ABNORMAL HIGH (ref 0.0–0.2)

## 2022-07-29 LAB — BASIC METABOLIC PANEL
Anion gap: 7 (ref 5–15)
BUN: 42 mg/dL — ABNORMAL HIGH (ref 8–23)
CO2: 19 mmol/L — ABNORMAL LOW (ref 22–32)
Calcium: 8 mg/dL — ABNORMAL LOW (ref 8.9–10.3)
Chloride: 104 mmol/L (ref 98–111)
Creatinine, Ser: 1.54 mg/dL — ABNORMAL HIGH (ref 0.61–1.24)
GFR, Estimated: 47 mL/min — ABNORMAL LOW (ref >60)
Glucose, Bld: 98 mg/dL (ref 70–99)
Potassium: 3.9 mmol/L (ref 3.5–5.1)
Sodium: 130 mmol/L — ABNORMAL LOW (ref 135–145)

## 2022-07-29 MED ORDER — FUROSEMIDE 10 MG/ML IJ SOLN
40.0000 mg | Freq: Two times a day (BID) | INTRAMUSCULAR | Status: AC
  Administered 2022-07-29 (×2): 40 mg via INTRAVENOUS
  Filled 2022-07-29 (×2): qty 4

## 2022-07-29 MED ORDER — TAMSULOSIN HCL 0.4 MG PO CAPS
0.4000 mg | ORAL_CAPSULE | Freq: Every day | ORAL | Status: DC
  Administered 2022-07-29 – 2022-08-01 (×4): 0.4 mg via ORAL
  Filled 2022-07-29 (×4): qty 1

## 2022-07-29 MED ORDER — POTASSIUM CHLORIDE CRYS ER 20 MEQ PO TBCR
40.0000 meq | EXTENDED_RELEASE_TABLET | Freq: Once | ORAL | Status: AC
  Administered 2022-07-29: 40 meq via ORAL
  Filled 2022-07-29: qty 2

## 2022-07-29 MED ORDER — FLEET ENEMA 7-19 GM/118ML RE ENEM
1.0000 | ENEMA | Freq: Once | RECTAL | Status: DC
  Filled 2022-07-29: qty 1

## 2022-07-29 MED ORDER — MIDODRINE HCL 5 MG PO TABS
2.5000 mg | ORAL_TABLET | Freq: Three times a day (TID) | ORAL | Status: DC
  Administered 2022-07-29 – 2022-07-31 (×6): 2.5 mg via ORAL
  Filled 2022-07-29 (×6): qty 1

## 2022-07-29 NOTE — Progress Notes (Addendum)
Patient ID: Walter George, male   DOB: 1945/08/12, 77 y.o.   MRN: [id number]     Advanced Heart Failure Rounding Note  PCP-Cardiologist: None   Subjective:    Had some urinary retention this am. Required I&O cath. Still has some lower extremity edema.    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   Weight Range: 75.7 kg Body mass index is 24.65 kg/m.   Vital Signs:   Temp:  [97.5 F (36.4 C)-98.3 F (36.8 C)] 98.3 F (36.8 C) (05/13 0824) Pulse Rate:  [82-84] 84 (05/13 0824) Resp:  [18-27] 20 (05/13 0824) BP: (104-125)/(71-97) 104/91 (05/13 0824) SpO2:  [96 %-99 %] 99 % (05/13 0824) Weight:  [75.7 kg] 75.7 kg (05/13 0500) Last BM Date :  (pta)  Weight change: Filed Weights   07/27/22 0500 07/28/22 0700 07/29/22 0500  Weight: 77.4 kg 76.3 kg 75.7 kg   Intake/Output:   Intake/Output Summary (Last 24 hours) at 07/29/2022 1015 Last data filed at 07/29/2022 0918 Gross per 24 hour  Intake 240 ml  Output 1600 ml  Net -1360 ml    Physical Exam  General:  Well appearing.  HEENT: normal Neck: supple. JVP 8-10. Carotids 2+ bilat; no bruits.  Cor: PMI nondisplaced. Regular rate & rhythm. No rubs, gallops or murmurs. Sternum stable Lungs: clear Abdomen: soft, nontender, nondistended. No hepatosplenomegaly.  Extremities: no cyanosis, clubbing, rash, 1+ edema Neuro: alert & orientedx3. Affect pleasant   Telemetry   Sinus 80s  Labs    CBC Recent Labs    07/28/22 0404 07/29/22 0138  WBC 10.1 8.8  HGB 8.9* 8.8*  HCT 27.3* 26.4*  MCV 92.5 92.0  PLT 251 281   Basic Metabolic Panel Recent Labs    40/98/11 0404 07/29/22 0138  NA 133* 130*  K 3.7 3.9  CL 103 104  CO2 19* 19*  GLUCOSE 93 98  BUN 49* 42*  CREATININE 1.58* 1.54*  CALCIUM 8.0* 8.0*   Liver Function Tests No results for input(s): "AST", "ALT", "ALKPHOS", "BILITOT", "PROT", "ALBUMIN" in the last 72 hours. No results for  input(s): "LIPASE", "AMYLASE" in the last 72 hours. Cardiac Enzymes No results for input(s): "CKTOTAL", "CKMB", "CKMBINDEX", "TROPONINI" in the last 72 hours.  BNP: BNP (last 3 results) Recent Labs    02/27/22 1651  BNP 68.6      Imaging    No results found.   Medications:     Scheduled Medications:  acetaminophen  1,000 mg Oral Q6H   Or   acetaminophen (TYLENOL) oral liquid 160 mg/5 mL  1,000 mg Per Tube Q6H   acetaminophen  1,000 mg Oral Once   apixaban  5 mg Oral BID   aspirin EC  81 mg Oral Daily   atorvastatin  80 mg Oral Daily   docusate sodium  200 mg Oral Daily   ezetimibe  10 mg Oral Daily   midodrine  5 mg Oral TID WC   pantoprazole  40 mg Oral Daily   polyethylene glycol  17 g Oral Daily   senna  2 tablet Oral QHS   sodium chloride flush  3 mL Intravenous Q12H   sodium phosphate  1 enema Rectal Once   tamsulosin  0.4 mg Oral QPC breakfast    Infusions:  sodium chloride      PRN Medications: sodium chloride, ipratropium-albuterol, metoCLOPramide (REGLAN) injection, ondansetron (ZOFRAN) IV, oxyCODONE, sodium chloride flush, traMADol   Assessment/Plan  1. CAD:  On 5/3, he developed pleuritic chest pain and came to the ER.  HS-TnI 1400=>1065.  Cath was done, showing severe 3 vessel disease with 95% mRCA, 90% ostial LCx, 99% ramus, 100% D1 with collaterals, 70% prox-mid LAD with abnormal RFR.  No vessel looked like acute plaque rupture.  Discussed with Dr. Lalla Brothers, suspect the pleuritic chest pain and TnI elevation were due to the AF ablation and not ACS.   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  - S/p CABG X 5 05/08. - Continue aspirin and statin.  - Still some volume overload. Diurese with IV lasix 40 BID.  - Can decrease midodrine to 2.5 mg TID  2. Atrial fibrillation: Paroxysmal, s/p ablation on 5/2.  He is in NSR now.  - Left atrial appendage clipped at time of CABG - Risk for recurrence  of AF perioperatively, Continue amiodarone 200 mg bid and continue until a month or so post-op if he remains in NSR.   - Anticoagulation when okay with TCTS - NSR 80s-90s  3. ONG:EXBMWUXLKG mildly lower.   4. Urinary retention: Required I&O cath. May need again if unable to void today  Length of Stay: 8  [facility], Walter Garnet, PA-C  07/29/2022, 10:15 AM  Advanced Heart Failure Team Pager [phone] (M-F; 7a - 5p)  Please contact [facility] for night-coverage after hours (5p -7a ) and weekends on [email or web address]  Patient seen with PA, agree with the above note.   Creatinine lower at 1.54 today.  Still mildly short of breath. Difficulty urinating and constipated.  Had I/O cath yesterday.   General: NAD Neck: JVP 10-11 cm, no thyromegaly or thyroid nodule.  Lungs: Mild crackles at bases.  CV: Nondisplaced PMI.  Heart regular S1/S2, no S3/S4, no murmur.  1+ edema to knees.  Abdomen: Soft, nontender, no hepatosplenomegaly, no distention.  Skin: Intact without lesions or rashes.  Neurologic: Alert and oriented x 3.  Psych: Normal affect. Extremities: No clubbing or cyanosis.  HEENT: Normal.   Mild volume overload on exam in setting of some urinary retention likely due to BPH.  Echo pre-op with EF 55-60%, normal RV.  - Agree with Flomax, dose now.  - Lasix 40 mg IV bid x 2 doses today with dose of KCl. Follow I/Os.  - May need I/O cath again if unable to void.   Can decrease midodrine to 2.5 tid.  Stop tomorrow if BP remains stable.   He remains in NSR.  Continue amiodarone for 1 month post-op.  Will need to restart anticoagulation when ok with surgery.   Aggressive bowel regimen.   Walter George 07/29/2022 10:47 AM

## 2022-07-29 NOTE — Progress Notes (Signed)
      [address]       [place] [id number]             [phone]      5 Days Post-Op Procedure(s) (LRB): CORONARY ARTERY BYPASS GRAFTING (CABG) X5 USING LEFT INTERNAL MAMMARY ARTERY AND ENDOSCOPICALLY HARVESTED RIGHT GREATER SAPHENOUS VEIN (N/A) TRANSESOPHAGEAL ECHOCARDIOGRAM (N/A) CLIPPING OF ATRIAL APPENDAGE USING ATRICURE ATRICLIP SIZE (Left) RIGHT CORONARY ENDARTERECETOMY (Right)  Subjective:  Patient sitting up in chair.. Feels pretty good from his chest area.  He has having issues with moving his bowels and urinating.  Objective: Vital signs in last 24 hours: Temp:  [97.5 F (36.4 C)-98.1 F (36.7 C)] 97.5 F (36.4 C) (05/13 0300) Pulse Rate:  [78-83] 83 (05/13 0015) Cardiac Rhythm: Normal sinus rhythm (05/12 1956) Resp:  [18-27] 18 (05/13 0300) BP: (95-125)/(64-97) 115/77 (05/13 0300) SpO2:  [96 %-97 %] 96 % (05/13 0300) Weight:  [75.7 kg] 75.7 kg (05/13 0500)  Intake/Output from previous day: 05/12 0701 - 05/13 0700 In: 240 [P.O.:240] Out: 800 [Urine:800]  General appearance: alert, cooperative, and no distress Heart: regular rate and rhythm Lungs: clear to auscultation bilaterally Abdomen: soft, midline rigidness, tenderness to palpation Extremities: extremities normal, atraumatic, no cyanosis or edema Wound: clean and dry  Lab Results: Recent Labs    07/28/22 0404 07/29/22 0138  WBC 10.1 8.8  HGB 8.9* 8.8*  HCT 27.3* 26.4*  PLT 251 281   BMET:  Recent Labs    07/28/22 0404 07/29/22 0138  NA 133* 130*  K 3.7 3.9  CL 103 104  CO2 19* 19*  GLUCOSE 93 98  BUN 49* 42*  CREATININE 1.58* 1.54*  CALCIUM 8.0* 8.0*    PT/INR: No results for input(s): "LABPROT", "INR" in the last 72 hours. ABG    Component Value Date/Time   PHART 7.345 (L) 07/24/2022 2135   HCO3 19.2 (L) 07/24/2022 2135   TCO2 20 (L) 07/24/2022 2135   ACIDBASEDEF 6.0 (H) 07/24/2022 2135   O2SAT 53.1 07/26/2022 0540   CBG (last 3)  Recent Labs     07/27/22 2120 07/28/22 0655 07/28/22 1120  GLUCAP 91 79 89    Assessment/Plan: S/P Procedure(s) (LRB): CORONARY ARTERY BYPASS GRAFTING (CABG) X5 USING LEFT INTERNAL MAMMARY ARTERY AND ENDOSCOPICALLY HARVESTED RIGHT GREATER SAPHENOUS VEIN (N/A) TRANSESOPHAGEAL ECHOCARDIOGRAM (N/A) CLIPPING OF ATRIAL APPENDAGE USING ATRICURE ATRICLIP SIZE (Left) RIGHT CORONARY ENDARTERECETOMY (Right)  CV- NSR, BP stable- continue Midodrine Pulm- no acute issues, off oxygen Renal- creatinine at 1.54, suspect may be partly related to inability to fully emptying his bladder.. will add flomax GI-constipation, no response to Lactulose, will add enema Dispo- patient stable, needs to ensure bladder and bowels are functioning appropriately.. will start Flomax, nursing to bladder scan this morning, enema for constipation.. possibly home later today vs tomorrow   LOS: 8 days    Lowella Dandy, PA-C 07/29/2022

## 2022-07-29 NOTE — Discharge Instructions (Signed)

## 2022-07-29 NOTE — Progress Notes (Signed)
Bladder scan >400 I&O cath- 800 cc

## 2022-07-29 NOTE — Progress Notes (Signed)
CARDIAC REHAB PHASE I   PRE:  Rate/Rhythm: 85 SR    BP: sitting 104/91    SpO2: 99 RA  MODE:  Ambulation: 620 ft   POST:  Rate/Rhythm: 110 ST    BP: sitting to BR     SpO2: wouldn't register  Pt stood from recliner independently and walked with contact guard, no RW. Had x2 moments of stagger/sway. Able to compensate but pt might would like RW at home for open spaces. To BR after walk and couldn't finish assessing.  [id number]   Ethelda Chick BS, ACSM-CEP 07/29/2022 10:39 AM

## 2022-07-29 NOTE — Care Management Important Message (Signed)
Important Message  Patient Details  Name: Walter George MRN: [id number] Date of Birth: 1946/01/14   Medicare Important Message Given:  Yes     Renie Ora 07/29/2022, 11:11 AM

## 2022-07-30 ENCOUNTER — Other Ambulatory Visit (HOSPITAL_COMMUNITY): Payer: Self-pay

## 2022-07-30 ENCOUNTER — Telehealth (HOSPITAL_COMMUNITY): Payer: Self-pay | Admitting: Pharmacy Technician

## 2022-07-30 DIAGNOSIS — I200 Unstable angina: Secondary | ICD-10-CM | POA: Diagnosis not present

## 2022-07-30 LAB — BASIC METABOLIC PANEL
Anion gap: 8 (ref 5–15)
BUN: 39 mg/dL — ABNORMAL HIGH (ref 8–23)
CO2: 22 mmol/L (ref 22–32)
Calcium: 7.9 mg/dL — ABNORMAL LOW (ref 8.9–10.3)
Chloride: 103 mmol/L (ref 98–111)
Creatinine, Ser: 1.66 mg/dL — ABNORMAL HIGH (ref 0.61–1.24)
GFR, Estimated: 43 mL/min — ABNORMAL LOW (ref >60)
Glucose, Bld: 117 mg/dL — ABNORMAL HIGH (ref 70–99)
Potassium: 3.7 mmol/L (ref 3.5–5.1)
Sodium: 133 mmol/L — ABNORMAL LOW (ref 135–145)

## 2022-07-30 MED ORDER — AMIODARONE LOAD VIA INFUSION
150.0000 mg | Freq: Once | INTRAVENOUS | Status: AC
  Administered 2022-07-30: 150 mg via INTRAVENOUS
  Filled 2022-07-30: qty 83.34

## 2022-07-30 MED ORDER — AMIODARONE HCL IN DEXTROSE 360-4.14 MG/200ML-% IV SOLN
60.0000 mg/h | INTRAVENOUS | Status: AC
  Administered 2022-07-30 (×2): 60 mg/h via INTRAVENOUS
  Filled 2022-07-30: qty 200

## 2022-07-30 MED ORDER — AMIODARONE HCL IN DEXTROSE 360-4.14 MG/200ML-% IV SOLN
INTRAVENOUS | Status: AC
  Filled 2022-07-30: qty 200

## 2022-07-30 MED ORDER — AMIODARONE HCL IN DEXTROSE 360-4.14 MG/200ML-% IV SOLN
30.0000 mg/h | INTRAVENOUS | Status: DC
  Administered 2022-07-30 – 2022-07-31 (×3): 30 mg/h via INTRAVENOUS
  Filled 2022-07-30 (×2): qty 200

## 2022-07-30 MED ORDER — DAPAGLIFLOZIN PROPANEDIOL 10 MG PO TABS
10.0000 mg | ORAL_TABLET | Freq: Every day | ORAL | Status: DC
  Administered 2022-07-31 – 2022-08-01 (×2): 10 mg via ORAL
  Filled 2022-07-30 (×2): qty 1

## 2022-07-30 NOTE — Progress Notes (Signed)
Converted from SR to A-fib RVR in to 120's CTS PA notified and new orders received.

## 2022-07-30 NOTE — Progress Notes (Addendum)
Patient ID: Walter George, male   DOB: 01-12-1946, 77 y.o.   MRN: [id number]     Advanced Heart Failure Rounding Note  PCP-Cardiologist: None   Subjective:    POD#6  Unfortunately, back in Afib w/ RVR this morning and back on amio gtt  BP soft 90s systolic. Remains on midodrine 2.5 tid   Had BM yesterday. Voiding improved.   Wt down 5 lb w/ diuresis yesterday. Slight bump in SCr 1.54>>1.66   Feels ok. OOB and sitting up in chair. Asymptomatic w/ Afib. No dyspnea. Son present at bedside.    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   Weight Range: 73.3 kg Body mass index is 23.85 kg/m.   Vital Signs:   Temp:  [97.8 F (36.6 C)-98.7 F (37.1 C)] (P) 98.7 F (37.1 C) (05/14 0756) Pulse Rate:  [79-94] 90 (05/14 0756) Resp:  [18-21] 21 (05/14 0756) BP: (87-157)/(54-126) 95/68 (05/14 0756) SpO2:  [97 %-100 %] 99 % (05/14 0756) Weight:  [73.3 kg] 73.3 kg (05/14 0558) Last BM Date :  (pta)  Weight change: Filed Weights   07/28/22 0700 07/29/22 0500 07/30/22 0558  Weight: 76.3 kg 75.7 kg 73.3 kg   Intake/Output:   Intake/Output Summary (Last 24 hours) at 07/30/2022 0911 Last data filed at 07/30/2022 1914 Gross per 24 hour  Intake --  Output 2850 ml  Net -2850 ml    Physical Exam   General:  Well appearing. No respiratory difficulty HEENT: normal Neck: supple. no JVD. Carotids 2+ bilat; no bruits. No lymphadenopathy or thyromegaly appreciated. Cor: PMI nondisplaced. Irregularly irregular rhythm. No rubs, gallops or murmurs. Sternotomy site ok  Lungs: clear Abdomen: soft, nontender, nondistended. No hepatosplenomegaly. No bruits or masses. Good bowel sounds. Extremities: no cyanosis, clubbing, rash, trace b/l ankle edema  Neuro: alert & oriented x 3, cranial nerves grossly intact. moves all 4 extremities w/o difficulty. Affect pleasant.    Telemetry   Afib low 100s-110s   Labs     CBC Recent Labs    07/28/22 0404 07/29/22 0138  WBC 10.1 8.8  HGB 8.9* 8.8*  HCT 27.3* 26.4*  MCV 92.5 92.0  PLT 251 281   Basic Metabolic Panel Recent Labs    78/29/56 0138 07/30/22 0126  NA 130* 133*  K 3.9 3.7  CL 104 103  CO2 19* 22  GLUCOSE 98 117*  BUN 42* 39*  CREATININE 1.54* 1.66*  CALCIUM 8.0* 7.9*   Liver Function Tests No results for input(s): "AST", "ALT", "ALKPHOS", "BILITOT", "PROT", "ALBUMIN" in the last 72 hours. No results for input(s): "LIPASE", "AMYLASE" in the last 72 hours. Cardiac Enzymes No results for input(s): "CKTOTAL", "CKMB", "CKMBINDEX", "TROPONINI" in the last 72 hours.  BNP: BNP (last 3 results) Recent Labs    02/27/22 1651  BNP 68.6      Imaging    No results found.   Medications:     Scheduled Medications:  acetaminophen  1,000 mg Oral Once   apixaban  5 mg Oral BID   aspirin EC  81 mg Oral Daily   atorvastatin  80 mg Oral Daily   docusate sodium  200 mg Oral Daily   ezetimibe  10 mg Oral Daily   midodrine  2.5 mg Oral TID WC   pantoprazole  40 mg Oral Daily   polyethylene glycol  17 g Oral Daily   senna  2 tablet Oral QHS   sodium  chloride flush  3 mL Intravenous Q12H   sodium phosphate  1 enema Rectal Once   tamsulosin  0.4 mg Oral QPC breakfast    Infusions:  sodium chloride      PRN Medications: sodium chloride, ipratropium-albuterol, metoCLOPramide (REGLAN) injection, ondansetron (ZOFRAN) IV, oxyCODONE, sodium chloride flush, traMADol   Assessment/Plan  1. CAD:  On 5/3, he developed pleuritic chest pain and came to the ER.  HS-TnI 1400=>1065.  Cath was done, showing severe 3 vessel disease with 95% mRCA, 90% ostial LCx, 99% ramus, 100% D1 with collaterals, 70% prox-mid LAD with abnormal RFR.  No vessel looked like acute plaque rupture.  Discussed with Dr. Lalla Brothers, suspect the pleuritic chest pain and TnI elevation were due to the AF ablation and not ACS.   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  - S/p CABG X 5 05/08. - Continue aspirin and statin.  - Volume improved w/ diuresis. Hold loop diuretics today w/ bump in SCr  - Continue midodrine to 2.5 mg TID  2. Atrial fibrillation: Paroxysmal, s/p ablation on 5/2.  He is in NSR now.  - Left atrial appendage clipped at time of CABG - Back in Afib w/ RVR this morning. Back on amio gtt    - Eliquis started per CT surgery   3. AKI: slight bump in SCr overnight w/ diuresis, 1.54>>1.66 - hold lasix today  - avoid hypotension. continue midodrine for BP support   4. Urinary retention: Due to BPH. Required I&O cath this admit. - voiding improved w/ addition of Flomax, will continue   5. Constipation  - resolved w/ bowl regimen, had BM yesterday   Per CT surgery, no plans for D/c today given he is back in afib w/ RVR.    Length of Stay: [address] PA-C  07/30/2022, 9:11 AM  Advanced Heart Failure Team Pager [phone] (M-F; 7a - 5p)  Please contact [facility] for night-coverage after hours (5p -7a ) and weekends on [email or web address]  Patient seen with PA, agree with the above note.   I/Os net negative 2850 cc.  Weight down.  Creatinine mildly higher at 1.66.   He went into atrial fibrillation with RVR but is back in NSR with amiodarone gtt.   General: NAD Neck: JVP 7-8 cm, no thyromegaly or thyroid nodule.  Lungs: Clear to auscultation bilaterally with normal respiratory effort. CV: Nondisplaced PMI.  Heart regular S1/S2, no S3/S4, no murmur.  1+ ankle edema.  Abdomen: Soft, nontender, no hepatosplenomegaly, no distention.  Skin: Intact without lesions or rashes.  Neurologic: Alert and oriented x 3.  Psych: Normal affect. Extremities: No clubbing or cyanosis.  HEENT: Normal.   PAF, now back in NSR.  Continue amiodarone gtt overnight, back to po amiodarone tomorrow as long as he remains in NSR.  Continue Eliquis.   Mild residual volume overload.  Good  diuresis with Lasix yesterday, weight down.  Creatinine mildly higher at 1.66.  No Lasix today, will start Farxiga 10 mg daily.   Still on midodrine 2.5 tid with SBP 90s-100s.  Continue today, will see if we can stop tomorrow.   Marca Ancona 07/30/2022 2:16 PM

## 2022-07-30 NOTE — Progress Notes (Addendum)
[address]       [place] [address]             [phone]     6 Days Post-Op Procedure(s) (LRB): CORONARY ARTERY BYPASS GRAFTING (CABG) X5 USING LEFT INTERNAL MAMMARY ARTERY AND ENDOSCOPICALLY HARVESTED RIGHT GREATER SAPHENOUS VEIN (N/A) TRANSESOPHAGEAL ECHOCARDIOGRAM (N/A) CLIPPING OF ATRIAL APPENDAGE USING ATRICURE ATRICLIP SIZE (Left) RIGHT CORONARY ENDARTERECETOMY (Right) Subjective: Feels well, + BM and voiding improved  Objective: Vital signs in last 24 hours: Temp:  [97.8 F (36.6 C)-98.4 F (36.9 C)] 97.9 F (36.6 C) (05/14 0254) Pulse Rate:  [79-94] 87 (05/14 0254) Cardiac Rhythm: Normal sinus rhythm (05/13 2012) Resp:  [18-20] 20 (05/14 0254) BP: (87-157)/(54-126) 94/62 (05/14 0254) SpO2:  [97 %-100 %] 97 % (05/14 0254) Weight:  [73.3 kg] 73.3 kg (05/14 0558)  Hemodynamic parameters for last 24 hours:    Intake/Output from previous day: 05/13 0701 - 05/14 0700 In: -  Out: 2850 [Urine:2850] Intake/Output this shift: No intake/output data recorded.  General appearance: alert, cooperative, and no distress Heart: regular rate and rhythm and occas extrasystole Lungs: mildly dim in bases Abdomen: benign Extremities: no edema Wound: incis healing well  Lab Results: Recent Labs    07/28/22 0404 07/29/22 0138  WBC 10.1 8.8  HGB 8.9* 8.8*  HCT 27.3* 26.4*  PLT 251 281   BMET:  Recent Labs    07/29/22 0138 07/30/22 0126  NA 130* 133*  K 3.9 3.7  CL 104 103  CO2 19* 22  GLUCOSE 98 117*  BUN 42* 39*  CREATININE 1.54* 1.66*  CALCIUM 8.0* 7.9*    PT/INR: No results for input(s): "LABPROT", "INR" in the last 72 hours. ABG    Component Value Date/Time   PHART 7.345 (L) 07/24/2022 2135   HCO3 19.2 (L) 07/24/2022 2135   TCO2 20 (L) 07/24/2022 2135   ACIDBASEDEF 6.0 (H) 07/24/2022 2135   O2SAT 53.1 07/26/2022 0540   CBG (last 3)  Recent Labs    07/27/22 2120 07/28/22 0655 07/28/22 1120  GLUCAP 91 79 89     Meds Scheduled Meds:  acetaminophen  1,000 mg Oral Once   apixaban  5 mg Oral BID   aspirin EC  81 mg Oral Daily   atorvastatin  80 mg Oral Daily   docusate sodium  200 mg Oral Daily   ezetimibe  10 mg Oral Daily   midodrine  2.5 mg Oral TID WC   pantoprazole  40 mg Oral Daily   polyethylene glycol  17 g Oral Daily   senna  2 tablet Oral QHS   sodium chloride flush  3 mL Intravenous Q12H   sodium phosphate  1 enema Rectal Once   tamsulosin  0.4 mg Oral QPC breakfast   Continuous Infusions:  sodium chloride     PRN Meds:.sodium chloride, ipratropium-albuterol, metoCLOPramide (REGLAN) injection, ondansetron (ZOFRAN) IV, oxyCODONE, sodium chloride flush, traMADol  Xrays No results found.  Assessment/Plan: S/P Procedure(s) (LRB): CORONARY ARTERY BYPASS GRAFTING (CABG) X5 USING LEFT INTERNAL MAMMARY ARTERY AND ENDOSCOPICALLY HARVESTED RIGHT GREATER SAPHENOUS VEIN (N/A) TRANSESOPHAGEAL ECHOCARDIOGRAM (N/A) CLIPPING OF ATRIAL APPENDAGE USING ATRICURE ATRICLIP SIZE (Left) RIGHT CORONARY ENDARTERECETOMY (Right) POD#6 1 afeb, VSS,mostly well controlled,  sinus rhythm, + PVC's, on midodrine- decreased yesterday, on apixaban for previous afib/ablation 2 O2 sats good on RA 3 has had BM 4 voiding- unmeasured, weight appears below preop 5 hyponatremia trend improved 6 creat conts to slowly trend higher, flomax  started yesterday, received IV lasix yesterday - AHF team assisting w/ management 7 appears to be stable for d/c if AHF team agrees     LOS: 9 days    Rowe Clack PA-C pager [phone] 07/30/2022  Patient seen and examined, agree with above Looks great Creatinine up slightly, I think he was a little overdiuresed as BP on low side this Am. No need for additional diuretics currently Home later today if OK with Cardiology Will see back in office in a week with a BMET  Mildred Tuccillo C. Dorris Fetch, MD [facility] [phone]

## 2022-07-30 NOTE — TOC Benefit Eligibility Note (Signed)
Patient Product/process development scientist completed.    The patient is currently admitted and upon discharge could be taking Farxiga 10 mg.  The current 30 day co-pay is $45.00.   The patient is currently admitted and upon discharge could be taking Jardiance 10 mg.  The current 30 day co-pay is $45.00.   The patient is insured through [facility] Part D   This test claim was processed through [facility]- copay amounts may vary at other pharmacies due to pharmacy/plan contracts, or as the patient moves through the different stages of their insurance plan.  Roland Earl, CPHT Pharmacy Patient Advocate Specialist [facility] Patient Advocate Team Direct Number: [phone]  Fax: [phone]

## 2022-07-30 NOTE — Telephone Encounter (Signed)
Pharmacy Patient Advocate Encounter  Insurance verification completed.    The patient is insured through [employer] Part D   The patient is currently admitted and ran test claims for the following: Farxiga, Jardiance.  Copays and coinsurance results were relayed to Inpatient clinical team.  

## 2022-07-30 NOTE — Progress Notes (Signed)
CARDIAC REHAB PHASE I   Pt resting in bed, feeling ok today. However disappointed he can't go home today. Pt reports feeling a little woozy right now. Would like to ambulate later today. Encouraged IS use, ambulation and adequate PO intake. Pt ordering lunch now. Will return to offer walk later today as time permits.    [id number] Woodroe Chen, RN BSN 07/30/2022 11:30 AM

## 2022-07-30 NOTE — Progress Notes (Signed)
      [address]       [place] [address]             [phone]        Contacted by patient's nurse.  He converted into Atrial Fibrillation with RVR.  Unfortunately hypotension has been persistent.  Unable to start BB..  Will started IV Amiodarone bolus and drip protocol.  Lowella Dandy, PA-C 9:17 AM 07/30/22

## 2022-07-31 DIAGNOSIS — I200 Unstable angina: Secondary | ICD-10-CM | POA: Diagnosis not present

## 2022-07-31 LAB — CBC
HCT: 24.9 % — ABNORMAL LOW (ref 39.0–52.0)
Hemoglobin: 8.3 g/dL — ABNORMAL LOW (ref 13.0–17.0)
MCH: 30.7 pg (ref 26.0–34.0)
MCHC: 33.3 g/dL (ref 30.0–36.0)
MCV: 92.2 fL (ref 80.0–100.0)
Platelets: 382 10*3/uL (ref 150–400)
RBC: 2.7 MIL/uL — ABNORMAL LOW (ref 4.22–5.81)
RDW: 14 % (ref 11.5–15.5)
WBC: 9.7 10*3/uL (ref 4.0–10.5)
nRBC: 0 % (ref 0.0–0.2)

## 2022-07-31 LAB — BASIC METABOLIC PANEL
Anion gap: 8 (ref 5–15)
BUN: 35 mg/dL — ABNORMAL HIGH (ref 8–23)
CO2: 22 mmol/L (ref 22–32)
Calcium: 7.8 mg/dL — ABNORMAL LOW (ref 8.9–10.3)
Chloride: 103 mmol/L (ref 98–111)
Creatinine, Ser: 1.4 mg/dL — ABNORMAL HIGH (ref 0.61–1.24)
GFR, Estimated: 52 mL/min — ABNORMAL LOW (ref >60)
Glucose, Bld: 121 mg/dL — ABNORMAL HIGH (ref 70–99)
Potassium: 3.7 mmol/L (ref 3.5–5.1)
Sodium: 133 mmol/L — ABNORMAL LOW (ref 135–145)

## 2022-07-31 MED ORDER — METOPROLOL SUCCINATE ER 25 MG PO TB24
12.5000 mg | ORAL_TABLET | Freq: Every day | ORAL | Status: DC
  Administered 2022-07-31 – 2022-08-01 (×2): 12.5 mg via ORAL
  Filled 2022-07-31 (×2): qty 1

## 2022-07-31 MED ORDER — POTASSIUM CHLORIDE CRYS ER 20 MEQ PO TBCR
20.0000 meq | EXTENDED_RELEASE_TABLET | Freq: Once | ORAL | Status: AC
  Administered 2022-07-31: 20 meq via ORAL
  Filled 2022-07-31: qty 1

## 2022-07-31 MED ORDER — AMIODARONE HCL 200 MG PO TABS
400.0000 mg | ORAL_TABLET | Freq: Two times a day (BID) | ORAL | Status: DC
  Administered 2022-07-31: 400 mg via ORAL
  Filled 2022-07-31: qty 2

## 2022-07-31 MED ORDER — METOPROLOL TARTRATE 12.5 MG HALF TABLET: 12.5000 mg | ORAL_TABLET | Freq: Two times a day (BID) | ORAL | Status: DC

## 2022-07-31 MED ORDER — AMIODARONE HCL IN DEXTROSE 360-4.14 MG/200ML-% IV SOLN
60.0000 mg/h | INTRAVENOUS | Status: AC
  Administered 2022-07-31: 60 mg/h via INTRAVENOUS

## 2022-07-31 MED ORDER — FUROSEMIDE 20 MG PO TABS
20.0000 mg | ORAL_TABLET | Freq: Every day | ORAL | Status: DC
  Administered 2022-07-31 – 2022-08-01 (×2): 20 mg via ORAL
  Filled 2022-07-31 (×2): qty 1

## 2022-07-31 MED ORDER — AMIODARONE HCL IN DEXTROSE 360-4.14 MG/200ML-% IV SOLN
30.0000 mg/h | INTRAVENOUS | Status: DC
  Administered 2022-07-31 – 2022-08-01 (×2): 30 mg/h via INTRAVENOUS
  Filled 2022-07-31 (×2): qty 200

## 2022-07-31 NOTE — Progress Notes (Signed)
Patient ID: Walter George, male   DOB: Sep 24, 1945, 77 y.o.   MRN: [id number]     Advanced Heart Failure Rounding Note  PCP-Cardiologist: None   Subjective:   POD#7  In and out of atrial fibrillation this morning, rate 90s-100s. He remains on amiodarone gtt at 30 mg/hr.   BP soft 90s-low 100s systolic.   SCr trending back down w/ diuretic hold, 1.66>>1.40. Wt up 1 lb.    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   Weight Range: 73.6 kg Body mass index is 23.95 kg/m.   Vital Signs:   Temp:  [97.8 F (36.6 C)-98.6 F (37 C)] 98.3 F (36.8 C) (05/15 0735) Pulse Rate:  [75-83] 75 (05/15 0735) Resp:  [20-22] 22 (05/15 0735) BP: (96-109)/(50-75) 109/75 (05/15 0735) SpO2:  [98 %-100 %] 100 % (05/15 0459) Weight:  [73.6 kg] 73.6 kg (05/15 0459) Last BM Date : 07/29/22  Weight change: Filed Weights   07/29/22 0500 07/30/22 0558 07/31/22 0459  Weight: 75.7 kg 73.3 kg 73.6 kg   Intake/Output:   Intake/Output Summary (Last 24 hours) at 07/31/2022 1007 Last data filed at 07/31/2022 0740 Gross per 24 hour  Intake 310.4 ml  Output 625 ml  Net -314.6 ml    Physical Exam   General: NAD Neck: JVP 8 cm, no thyromegaly or thyroid nodule.  Lungs: Clear to auscultation bilaterally with normal respiratory effort. CV: Nondisplaced PMI.  Heart irregular S1/S2, no S3/S4, no murmur.  1+ ankle edema.  Abdomen: Soft, nontender, no hepatosplenomegaly, no distention.  Skin: Intact without lesions or rashes.  Neurologic: Alert and oriented x 3.  Psych: Normal affect. Extremities: No clubbing or cyanosis.  HEENT: Normal.    Telemetry   NSR alternating with Afib low 100s (personally reviewed)  Labs    CBC Recent Labs    07/29/22 0138 07/31/22 0154  WBC 8.8 9.7  HGB 8.8* 8.3*  HCT 26.4* 24.9*  MCV 92.0 92.2  PLT 281 382   Basic Metabolic Panel Recent Labs    53/66/44 0126 07/31/22 0154  NA 133*  133*  K 3.7 3.7  CL 103 103  CO2 22 22  GLUCOSE 117* 121*  BUN 39* 35*  CREATININE 1.66* 1.40*  CALCIUM 7.9* 7.8*   Liver Function Tests No results for input(s): "AST", "ALT", "ALKPHOS", "BILITOT", "PROT", "ALBUMIN" in the last 72 hours. No results for input(s): "LIPASE", "AMYLASE" in the last 72 hours. Cardiac Enzymes No results for input(s): "CKTOTAL", "CKMB", "CKMBINDEX", "TROPONINI" in the last 72 hours.  BNP: BNP (last 3 results) Recent Labs    02/27/22 1651  BNP 68.6      Imaging    No results found.   Medications:     Scheduled Medications:  acetaminophen  1,000 mg Oral Once   amiodarone  400 mg Oral BID   apixaban  5 mg Oral BID   aspirin EC  81 mg Oral Daily   atorvastatin  80 mg Oral Daily   dapagliflozin propanediol  10 mg Oral Daily   docusate sodium  200 mg Oral Daily   ezetimibe  10 mg Oral Daily   metoprolol tartrate  12.5 mg Oral BID   midodrine  2.5 mg Oral TID WC   pantoprazole  40 mg Oral Daily   polyethylene glycol  17 g Oral Daily   senna  2 tablet Oral QHS   sodium chloride flush  3 mL Intravenous  Q12H   sodium phosphate  1 enema Rectal Once   tamsulosin  0.4 mg Oral QPC breakfast    Infusions:  sodium chloride     amiodarone 30 mg/hr (07/31/22 0952)    PRN Medications: sodium chloride, ipratropium-albuterol, metoCLOPramide (REGLAN) injection, ondansetron (ZOFRAN) IV, oxyCODONE, sodium chloride flush, traMADol   Assessment/Plan   1. CAD:  On 5/3, he developed pleuritic chest pain and came to the ER.  HS-TnI 1400=>1065.  Cath was done, showing severe 3 vessel disease with 95% mRCA, 90% ostial LCx, 99% ramus, 100% D1 with collaterals, 70% prox-mid LAD with abnormal RFR.  No vessel looked like acute plaque rupture.  Discussed with Dr. Lalla Brothers, suspect the pleuritic chest pain and TnI elevation were due to the AF ablation and not ACS. 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 - Continue aspirin and statin.  2. Acute diastolic CHF:  Echo 5/5 with EF  55-60%, normal RV, trivial MR/TR, Iatrogenic ASD is present with all L>R shunting (from AF ablation).  Mild post-op volume overload.  - Continue Farxiga 10 mg daily.   - Discontinue midodrine today.  - Will use Lasix 20 mg daily for now.  3. Atrial fibrillation: Paroxysmal, s/p ablation on 5/2.  Has had on and off AF runs last 2 days.  Left atrial appendage clipped at time of CABG - He is back on Eliquis.  - Continue amiodarone gtt today, increase to 60 mg/hr for 6 hrs then back to 30 mg/hr.  - Add Toprol XL 12.5 daily.  4. AKI: Scr trending back down, 1.7>>1.4 today  5. Urinary retention: Due to BPH. Required I&O cath this admit. - voiding improved w/ addition of Flomax, will continue   Watch today, as long as predominantly in NSR and rate controlled when in AF, can likely go home tomorrow.    Marca Ancona 07/31/2022 10:07 AM

## 2022-07-31 NOTE — Progress Notes (Signed)
CARDIAC REHAB PHASE I    Pt resting in bed, feeling well today. Offered walk in halls, however pt is visiting with friends and family. Pt asked for me to return later today. Informed pt that I may not have time to return today, but I will if able. Informed pt that he can ask to walk with floor staff also. Will continue to follow.     [id number]  Woodroe Chen, RN BSN 07/31/2022 1330

## 2022-07-31 NOTE — Progress Notes (Addendum)
[address]       [place] [address]             [phone]     7 Days Post-Op Procedure(s) (LRB): CORONARY ARTERY BYPASS GRAFTING (CABG) X5 USING LEFT INTERNAL MAMMARY ARTERY AND ENDOSCOPICALLY HARVESTED RIGHT GREATER SAPHENOUS VEIN (N/A) TRANSESOPHAGEAL ECHOCARDIOGRAM (N/A) CLIPPING OF ATRIAL APPENDAGE USING ATRICURE ATRICLIP SIZE (Left) RIGHT CORONARY ENDARTERECETOMY (Right) Subjective: Generally feels well  Objective: Vital signs in last 24 hours: Temp:  [97.8 F (36.6 C)-98.7 F (37.1 C)] 97.8 F (36.6 C) (05/15 0459) Pulse Rate:  [75-90] 83 (05/14 1800) Cardiac Rhythm: Normal sinus rhythm (05/14 2015) Resp:  [20-23] 20 (05/15 0459) BP: (95-109)/(50-70) 100/59 (05/15 0459) SpO2:  [98 %-100 %] 100 % (05/15 0459) Weight:  [73.6 kg] 73.6 kg (05/15 0459)  Hemodynamic parameters for last 24 hours:    Intake/Output from previous day: 05/14 0701 - 05/15 0700 In: 310.4 [P.O.:120; I.V.:190.4] Out: 375 [Urine:375] Intake/Output this shift: No intake/output data recorded.  General appearance: alert, cooperative, and no distress Heart: regular rate and rhythm Lungs: mildly dim in bases Abdomen: benign Extremities: no edema Wound: incis healing well  Lab Results: Recent Labs    07/29/22 0138 07/31/22 0154  WBC 8.8 9.7  HGB 8.8* 8.3*  HCT 26.4* 24.9*  PLT 281 382   BMET:  Recent Labs    07/30/22 0126 07/31/22 0154  NA 133* 133*  K 3.7 3.7  CL 103 103  CO2 22 22  GLUCOSE 117* 121*  BUN 39* 35*  CREATININE 1.66* 1.40*  CALCIUM 7.9* 7.8*    PT/INR: No results for input(s): "LABPROT", "INR" in the last 72 hours. ABG    Component Value Date/Time   PHART 7.345 (L) 07/24/2022 2135   HCO3 19.2 (L) 07/24/2022 2135   TCO2 20 (L) 07/24/2022 2135   ACIDBASEDEF 6.0 (H) 07/24/2022 2135   O2SAT 53.1 07/26/2022 0540   CBG (last 3)  Recent Labs    07/28/22 1120  GLUCAP 89    Meds Scheduled Meds:  acetaminophen  1,000 mg Oral  Once   apixaban  5 mg Oral BID   aspirin EC  81 mg Oral Daily   atorvastatin  80 mg Oral Daily   dapagliflozin propanediol  10 mg Oral Daily   docusate sodium  200 mg Oral Daily   ezetimibe  10 mg Oral Daily   midodrine  2.5 mg Oral TID WC   pantoprazole  40 mg Oral Daily   polyethylene glycol  17 g Oral Daily   senna  2 tablet Oral QHS   sodium chloride flush  3 mL Intravenous Q12H   sodium phosphate  1 enema Rectal Once   tamsulosin  0.4 mg Oral QPC breakfast   Continuous Infusions:  sodium chloride     amiodarone 30 mg/hr (07/30/22 2013)   PRN Meds:.sodium chloride, ipratropium-albuterol, metoCLOPramide (REGLAN) injection, ondansetron (ZOFRAN) IV, oxyCODONE, sodium chloride flush, traMADol  Xrays No results found.  Assessment/Plan: S/P Procedure(s) (LRB): CORONARY ARTERY BYPASS GRAFTING (CABG) X5 USING LEFT INTERNAL MAMMARY ARTERY AND ENDOSCOPICALLY HARVESTED RIGHT GREATER SAPHENOUS VEIN (N/A) TRANSESOPHAGEAL ECHOCARDIOGRAM (N/A) CLIPPING OF ATRIAL APPENDAGE USING ATRICURE ATRICLIP SIZE (Left) RIGHT CORONARY ENDARTERECETOMY (Right) POD#7  1 afeb, VSS, s BP 90's-100's om low dose midodrine, afib w/RVR yesterday- back in sinus, conts eliquis, will transition to po amio 2 O2 sats good on RA 3 voiding, weight stable 4 BUN/creat trending lower,creat  1.40 today- off diuretics  5 minor hyponatremia- stable at sodium 133 6 expected ABLA- slightly lower- H/H 8.3/24.9- not at transfusion threshold- could consider Iron supp 7 home likely in am if no afib recurrence or new issues develop- doing very well      LOS: 10 days    Rowe Clack PA-C Pager [phone] 07/31/2022 In atrial fib with rate in 100-120 range Has LAA clip and is on Eliquis so rate control is primary issue Will start low dose lopressor 12.5 mg BID for rate control and monitor BP  Viviann Spare C. Dorris Fetch, MD [facility] [phone]

## 2022-08-01 DIAGNOSIS — I200 Unstable angina: Secondary | ICD-10-CM | POA: Diagnosis not present

## 2022-08-01 LAB — CBC
HCT: 25.3 % — ABNORMAL LOW (ref 39.0–52.0)
Hemoglobin: 8.4 g/dL — ABNORMAL LOW (ref 13.0–17.0)
MCH: 31 pg (ref 26.0–34.0)
MCHC: 33.2 g/dL (ref 30.0–36.0)
MCV: 93.4 fL (ref 80.0–100.0)
Platelets: 455 10*3/uL — ABNORMAL HIGH (ref 150–400)
RBC: 2.71 MIL/uL — ABNORMAL LOW (ref 4.22–5.81)
RDW: 14.1 % (ref 11.5–15.5)
WBC: 8.8 10*3/uL (ref 4.0–10.5)
nRBC: 0 % (ref 0.0–0.2)

## 2022-08-01 LAB — BASIC METABOLIC PANEL
Anion gap: 10 (ref 5–15)
BUN: 32 mg/dL — ABNORMAL HIGH (ref 8–23)
CO2: 20 mmol/L — ABNORMAL LOW (ref 22–32)
Calcium: 8 mg/dL — ABNORMAL LOW (ref 8.9–10.3)
Chloride: 105 mmol/L (ref 98–111)
Creatinine, Ser: 1.45 mg/dL — ABNORMAL HIGH (ref 0.61–1.24)
GFR, Estimated: 50 mL/min — ABNORMAL LOW (ref >60)
Glucose, Bld: 117 mg/dL — ABNORMAL HIGH (ref 70–99)
Potassium: 3.7 mmol/L (ref 3.5–5.1)
Sodium: 135 mmol/L (ref 135–145)

## 2022-08-01 MED ORDER — AMIODARONE HCL 200 MG PO TABS: 200.0000 mg | ORAL_TABLET | Freq: Two times a day (BID) | ORAL | 1 refills | Status: DC

## 2022-08-01 MED ORDER — DAPAGLIFLOZIN PROPANEDIOL 10 MG PO TABS: 10.0000 mg | ORAL_TABLET | Freq: Every day | ORAL | 1 refills | Status: DC

## 2022-08-01 MED ORDER — FUROSEMIDE 20 MG PO TABS: 20.0000 mg | ORAL_TABLET | ORAL | Status: DC

## 2022-08-01 MED ORDER — METOPROLOL SUCCINATE ER 25 MG PO TB24: 12.5000 mg | ORAL_TABLET | Freq: Every day | ORAL | 1 refills | Status: DC

## 2022-08-01 MED ORDER — TAMSULOSIN HCL 0.4 MG PO CAPS: 0.4000 mg | ORAL_CAPSULE | Freq: Every day | ORAL | 1 refills | Status: DC

## 2022-08-01 MED ORDER — AMIODARONE HCL 200 MG PO TABS
200.0000 mg | ORAL_TABLET | Freq: Two times a day (BID) | ORAL | Status: DC
  Administered 2022-08-01: 200 mg via ORAL
  Filled 2022-08-01: qty 1

## 2022-08-01 MED ORDER — ASPIRIN 81 MG PO TBEC: 81.0000 mg | DELAYED_RELEASE_TABLET | Freq: Every day | ORAL | 12 refills | Status: DC

## 2022-08-01 MED ORDER — DOCUSATE SODIUM 100 MG PO CAPS: 200.0000 mg | ORAL_CAPSULE | Freq: Every day | ORAL | 0 refills | Status: DC

## 2022-08-01 MED ORDER — OXYCODONE HCL 5 MG PO TABS: 5.0000 mg | ORAL_TABLET | Freq: Four times a day (QID) | ORAL | 0 refills | Status: AC | PRN

## 2022-08-01 MED ORDER — FUROSEMIDE 20 MG PO TABS: 20.0000 mg | ORAL_TABLET | ORAL | 1 refills | Status: DC

## 2022-08-01 MED ORDER — POTASSIUM CHLORIDE CRYS ER 10 MEQ PO TBCR: 10.0000 meq | EXTENDED_RELEASE_TABLET | ORAL | Status: DC

## 2022-08-01 MED ORDER — POTASSIUM CHLORIDE CRYS ER 10 MEQ PO TBCR: 10.0000 meq | EXTENDED_RELEASE_TABLET | ORAL | 1 refills | Status: DC

## 2022-08-01 NOTE — Progress Notes (Signed)
CARDIAC REHAB PHASE I   PRE:  Rate/Rhythm: 69 SR   BP:  Sitting: 111/75      SaO2: 94 RA  MODE:  Ambulation: 200 ft   POST:  Rate/Rhythm: 85 SR   BP:  Sitting: 120/75      SaO2: 96 RA   Pt ambulated independently in hall. Tolerated with no pain, dizziness and mild SOB. Returned to chair with call bell and bedside table in reach. Post OHS education including site care, restrictions, risk factors, exercise guidelines, IS use at home, home needs at discharge, sternal precautions and CRP2 reviewed. All questions and concerns addressed. Will refer to [facility] for CRP2 per pt request. Plan for home later today.   [id number]  Woodroe Chen, RN BSN 08/01/2022 11:02 AM

## 2022-08-01 NOTE — Progress Notes (Signed)
Patient ID: Walter George, male   DOB: [birth year], 77 y.o.   MRN: [id number]     Advanced Heart Failure Rounding Note  PCP-Cardiologist: None   Subjective:    Patient is in NSR today.  On po amiodarone.   BP stable off midodrine.  No dyspnea.   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   Weight Range: 73.8 kg Body mass index is 24.03 kg/m.   Vital Signs:   Temp:  [97.9 F (36.6 C)-98.5 F (36.9 C)] 97.9 F (36.6 C) (05/16 0837) Pulse Rate:  [64-77] 64 (05/16 0837) Resp:  [16-23] 16 (05/16 0837) BP: (96-112)/(60-75) 101/68 (05/16 0837) SpO2:  [97 %-100 %] 97 % (05/16 0837) Weight:  [73.8 kg] 73.8 kg (05/16 0634) Last BM Date : 07/29/22  Weight change: Filed Weights   07/30/22 0558 07/31/22 0459 08/01/22 0634  Weight: 73.3 kg 73.6 kg 73.8 kg   Intake/Output:   Intake/Output Summary (Last 24 hours) at 08/01/2022 1047 Last data filed at 08/01/2022 0830 Gross per 24 hour  Intake 729.06 ml  Output 500 ml  Net 229.06 ml    Physical Exam   General: NAD Neck: No JVD, no thyromegaly or thyroid nodule.  Lungs: Clear to auscultation bilaterally with normal respiratory effort. CV: Nondisplaced PMI.  Heart regular S1/S2, no S3/S4, no murmur.  Trace ankle edema.  Abdomen: Soft, nontender, no hepatosplenomegaly, no distention.  Skin: Intact without lesions or rashes.  Neurologic: Alert and oriented x 3.  Psych: Normal affect. Extremities: No clubbing or cyanosis.  HEENT: Normal.    Telemetry   NSR (personally reviewed)  Labs    CBC Recent Labs    07/31/22 0154 08/01/22 0140  WBC 9.7 8.8  HGB 8.3* 8.4*  HCT 24.9* 25.3*  MCV 92.2 93.4  PLT 382 455*   Basic Metabolic Panel Recent Labs    78/29/56 0154 08/01/22 0140  NA 133* 135  K 3.7 3.7  CL 103 105  CO2 22 20*  GLUCOSE 121* 117*  BUN 35* 32*  CREATININE 1.40* 1.45*  CALCIUM 7.8* 8.0*   Liver Function Tests No results for  input(s): "AST", "ALT", "ALKPHOS", "BILITOT", "PROT", "ALBUMIN" in the last 72 hours. No results for input(s): "LIPASE", "AMYLASE" in the last 72 hours. Cardiac Enzymes No results for input(s): "CKTOTAL", "CKMB", "CKMBINDEX", "TROPONINI" in the last 72 hours.  BNP: BNP (last 3 results) Recent Labs    02/27/22 1651  BNP 68.6      Imaging    No results found.   Medications:     Scheduled Medications:  acetaminophen  1,000 mg Oral Once   amiodarone  200 mg Oral BID   apixaban  5 mg Oral BID   aspirin EC  81 mg Oral Daily   atorvastatin  80 mg Oral Daily   dapagliflozin propanediol  10 mg Oral Daily   docusate sodium  200 mg Oral Daily   ezetimibe  10 mg Oral Daily   furosemide  20 mg Oral Daily   metoprolol succinate  12.5 mg Oral Daily   pantoprazole  40 mg Oral Daily   polyethylene glycol  17 g Oral Daily   senna  2 tablet Oral QHS   sodium chloride flush  3 mL Intravenous Q12H   sodium phosphate  1 enema Rectal Once   tamsulosin  0.4 mg Oral QPC breakfast    Infusions:  sodium chloride  PRN Medications: sodium chloride, ipratropium-albuterol, metoCLOPramide (REGLAN) injection, ondansetron (ZOFRAN) IV, oxyCODONE, sodium chloride flush, traMADol   Assessment/Plan   1. CAD:  On 5/3, he developed pleuritic chest pain and came to the ER.  HS-TnI 1400=>1065.  Cath was done, showing severe 3 vessel disease with 95% mRCA, 90% ostial LCx, 99% ramus, 100% D1 with collaterals, 70% prox-mid LAD with abnormal RFR.  No vessel looked like acute plaque rupture.  Discussed with Dr. Lalla Brothers, suspect the pleuritic chest pain and TnI elevation were due to the AF ablation and not ACS. 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 - Continue aspirin and statin/Zetia.  2. 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  Mild post-op volume overload, now improved.  - Continue Farxiga 10 mg daily.   - Discontinue  midodrine today.  - Will use Lasix 20 mg every other day at home for now.  3. Atrial fibrillation: Paroxysmal, s/p ablation on 5/2.  Has had on and off AF runs last 2 days.  Left atrial appendage clipped at time of CABG - He is back on Eliquis.  - Amiodarone 200 mg bid x 1 week then 200 mg daily.  Will stop after a month post-op if he remains in NSR.  - Continue Toprol XL 12.5 daily.  4. AKI: Creatinine stable 1.45.  5. Urinary retention: Due to BPH. Required I&O cath this admit. - voiding improved w/ addition of Flomax, will continue   I think he can go home today.  I will see next week.  Meds for home: Flomax 0.4 daily, amiodarone 200 bid x 1 week then 200 mg daily, Zetia 10 daily, Toprol XL 12.5 daily, Lasix 20 mg every other day, KCl 10 mEq every other day, Farxiga 10 mg daily, ASA 81, atorvastatin 80 daily.   Marca Ancona 08/01/2022 10:47 AM

## 2022-08-01 NOTE — Care Management (Deleted)
    Durable Medical Equipment  (From admission, onward)           Start     Ordered   08/01/22 1131  For home use only DME 4 wheeled rolling walker with seat  Once       Comments: S/p CABG  Question:  Patient needs a walker to treat with the following condition  Answer:  Physical deconditioning   08/01/22 1130

## 2022-08-01 NOTE — Discharge Summary (Signed)
[address]       [place] [address]             [phone]    Physician Discharge Summary  Patient ID: Walter George MRN: [id number] DOB/AGE: 1945/04/12 77 y.o.  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  His atrial Fibrillation was first diagnosed in November of 2023, requiring cardioversion in January of 2024.  He is physically active, but would experience dyspnea when ambulating up inclines.  He had a repeat ablation 07/19/2022.  He presented back to the ED the following evening with complaints of substernal chest pain.  He was also short of breath.  Workup showed elevated troponin levels.  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  He was admitted for coronary bypass grafting consult.  Hospital Course:  He was initially evaluated by Dr. Cliffton George who felt the patient would be a surgical candidate.  However due to scheduling in the operating room the patient was set up with Dr.  Dorris George.  He evaluated the patient and was in agreement the patient would be a good surgical candidate.  The risks and benefits of the procedure were explained to the patient and he was agreeable to proceed.  He was taken to the operating room on 07/24/2022.  He underwent CABG x 5 utilizing LIMA to LAD, SVG to Distal RCA, SVG to Diagonal, and Sequential SVG to Ramus and OM.  He underwent Clipping of his Left Atrial Appendage with an Atricure Pro V2 clip and endoscopic harvest of greater saphenous vein from his right leg.  He tolerated the procedure without difficulty and was taken to the SICU in stable condition.  He remained hemodynamically stable and was weaned from the mechanical ventilator for extubation by 8pm on the day of surgery. Chest tubes and monitoring lines were removed on post-op day 1 and Walter George was mobilized in the ICU. He was started on Midodrine. EPW were removed on 05/11. He had AKI post op. His creatinine went up to 1.65 but is trending down to his baseline.  Once creatinine was normalized, he was diuresed. He was felt surgically stable for transfer from the ICU to 4E on 05/12. He was having difficulty voiding and was started on Flomax.  Bladder scan showed >400cc and he required I/O catheterization with removal of 800 cc of urine.  He was constipated and was treated with laxatives and did achieve the result on postop day 5.  He  is ambulating on room air with good oxygenation. All wounds are clean, dry, healing without signs of infection.  Unfortunately on postoperative day #6 he went back into atrial fibrillation with RVR and was subsequently been placed on an amiodarone drip.  Additionally his creatinine did increase to 1.66 and is felt he may have been over diuresed. Lasix has been adjusted by the AHF team who feel he should remain on low dose (20 mg) every other day at the time of discharge.  Amiodarone was transitioned to oral dosing and he has maintained sinus rhythm for over 24  hours.  He is also been started on a low-dose beta-blocker and has been tolerated well.  Midodrine was used initially to assist with blood pressure management but has been discontinued.  Discharge medications have been adjusted per the advanced heart failure team recommendations from a cardiology perspective.  Overall, at the time of discharge the patient is felt to be quite stable.  Consults: cardiology  Significant Diagnostic Studies:  DG Chest Port 1 View  Result Date: 07/26/2022 CLINICAL DATA:  Follow-up atelectasis.  Status post CABG. EXAM: PORTABLE CHEST 1 VIEW COMPARISON:  07/25/2018 for FINDINGS: Interval removal of the pulmonary arterial catheter, mediastinal drain and chest tube. The enteric tube has been removed. No significant pneumothorax identified. Stable cardiomediastinal contours. Persistent small left pleural effusion with left lower lobe atelectasis. There is a new lucency identified along the undersurface of the right hemidiaphragm, not seen on the previous imaging. IMPRESSION: 1. Interval removal of pulmonary arterial catheter, mediastinal drain and chest tube. No significant pneumothorax identified. 2. Persistent small left pleural effusion with left lower lobe atelectasis. 3. There is a new lucency identified along the undersurface of the right hemidiaphragm. Cannot exclude pneumoperitoneum. Electronically Signed   By: Signa Kell M.D.   On: 07/26/2022 08:38   DG Chest Port 1 View  Result Date: 07/25/2022 CLINICAL DATA:  Status post CABG EXAM: PORTABLE CHEST 1 VIEW COMPARISON:  CXR 07/24/22 FINDINGS: Status post median sternotomy and CABG. Right-sided central venous catheter sheath and PA catheter in place. The PA catheter has been retracted with the tip now positioned near the main PA. Mediastinal drain in place. Left-sided thoracostomy tube in place. Small left pleural effusion, new from prior exam. There is new small right-sided pneumothorax. There is a hazy opacity at the left  lung base which could represent atelectasis or infection. Visualized upper abdomen is unremarkable. No radiographically apparent displaced rib fractures. IMPRESSION: 1. New small right-sided pneumothorax. 2. Small left pleural effusion, new from prior exam. 3. Hazy opacity at the left lung base could represent atelectasis or infection. 4. Lines and tubes as described above. These results will be called to the ordering clinician or representative by the Radiologist Assistant, and communication documented in the PACS or [employer]. Electronically Signed   By: Lorenza Cambridge M.D.   On: 07/25/2022 08:43   DG Chest Port 1 View  Result Date: 07/24/2022 CLINICAL DATA:  Post CABG EXAM: PORTABLE CHEST 1 VIEW COMPARISON:  07/23/2022 FINDINGS: Endotracheal tube tip is about 6 cm superior to the carina. Esophageal tube tip below the diaphragm but incompletely visualized. Right IJ Swan-Ganz catheter is looped in the region of central pulmonary arteries with the tip directed cranial and to the patient's left, likely in the region of left upper lobe artery. Interval sternotomy and left atrial appendage clip. Placement of mediastinal and chest drainage catheters. Minimal atelectasis left lung base. Normal cardiomediastinal silhouette. Possible tiny left effusion. Possible trace  right apical pneumothorax. IMPRESSION: 1. Endotracheal tube tip about 6 cm superior to carina. 2. Right IJ Swan-Ganz catheter is looped in the region of central pulmonary artery with the tip directed cranial and to the patient's left, likely in the region of left upper lobe artery. 3. Possible trace right apical pneumothorax. 4. Mild left basilar atelectasis and trace left pleural effusion. These results will be called to the ordering clinician or representative by the Radiologist Assistant, and communication documented in the PACS or [employer]. Electronically Signed   By: Jasmine Pang M.D.   On: 07/24/2022 15:13   ECHO INTRAOPERATIVE  TEE  Result Date: 07/24/2022  *INTRAOPERATIVE TRANSESOPHAGEAL REPORT *  Patient Name:   Walter George Date of Exam: 07/24/2022 Medical Rec #:  [id number]           Height:       69.0 in Accession #:    [id number]          Weight:       163.6 lb Date of Birth:  1945/06/09          BSA:          1.90 m Patient Age:    76 years            BP:           102/69 mmHg Patient Gender: M                   HR:           46 bpm. Exam Location:  Inpatient Transesophogeal exam was perform intraoperatively during surgical procedure. Patient was closely monitored under general anesthesia during the entirety of examination. Indications:     coronary artery bypass surgery Sonographer:     Delcie Roch RDCS Performing Phys: [id number] Decent HENDRICKSON Diagnosing Phys: Gaynelle Adu MD Complications: No known complications during this procedure. POST-OP IMPRESSIONS Overall, there were no significant changes from pre-bypass. PRE-OP FINDINGS  Left Ventricle: The left ventricle has normal systolic function, with an ejection fraction of 55-60%. The cavity size was normal. There is no increase in left ventricular wall thickness. No evidence of left ventricular regional wall motion abnormalities. There is no left ventricular hypertrophy. Right Ventricle: The right ventricle has normal systolic function. The cavity was normal. There is no increase in right ventricular wall thickness. Left Atrium: Left atrial size was not assessed. No left atrial/left atrial appendage thrombus was detected. Right Atrium: Right atrial size was not assessed. Interatrial Septum: Evidence of atrial level shunting detected by color flow Doppler. Pericardium: There is no evidence of pericardial effusion. Mitral Valve: The mitral valve is normal in structure. Mitral valve regurgitation is trivial by color flow Doppler. Tricuspid Valve: The tricuspid valve was normal in structure. Tricuspid valve regurgitation was not visualized by color flow Doppler.  Aortic Valve: The aortic valve is tricuspid Aortic valve regurgitation was not visualized by color flow Doppler. There is no stenosis of the aortic valve. There is moderate thickening present with mildly decreased mobility. Pulmonic Valve: The pulmonic valve was normal in structure. Pulmonic valve regurgitation is not visualized by color flow Doppler. 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 +--------------+--------++ LEFT VENTRICLE         +--------------+--------++ PLAX 2D                +--------------+--------++ LVOT diam:    2.30 cm  +--------------+--------++ LVOT Area:  4.15 cm +--------------+--------++                        +--------------+--------++ +-------------+-----------++ AORTIC VALVE             +-------------+-----------++ AV Vmax:     136.00 cm/s +-------------+-----------++ AV Vmean:    90.900 cm/s +-------------+-----------++ AV VTI:      0.324 m     +-------------+-----------++ AV Peak Grad:7.4 mmHg    +-------------+-----------++ AV Mean Grad:4.0 mmHg    +-------------+-----------++  +--------------+-------+ SHUNTS                +--------------+-------+ Systemic Diam:2.30 cm +--------------+-------+  Gaynelle Adu MD Electronically signed by Gaynelle Adu MD Signature Date/Time: 07/24/2022/2:46:08 PM    Final    EP STUDY  Result Date: 07/24/2022 See surgical note for result.  VAS US DOPPLER PRE CABG  Result Date: 07/23/2022 PREOPERATIVE VASCULAR EVALUATION Patient Name:  Walter George  Date of Exam:   07/23/2022 Medical Rec #: [id number]            Accession #:    [id number] Date of Birth: Dec 22, 1945           Patient Gender: M Patient Age:   89 years Exam Location:  [facility] Procedure:      VAS US DOPPLER PRE CABG Referring Phys: Viviann Spare HENDRICKSON --------------------------------------------------------------------------------  Indications:       Pre-CABG. Risk Factors:     Hyperlipidemia, no history of smoking, coronary artery                   disease. Comparison Study: No previous studies. Performing Technologist: McKayla Maag RVT, [place]  Examination Guidelines: A complete evaluation includes B-mode imaging, spectral Doppler, color Doppler, and power Doppler as needed of all accessible portions of each vessel. Bilateral testing is considered an integral part of a complete examination. Limited examinations for reoccurring indications may be performed as noted.  Right Carotid Findings: +----------+--------+--------+--------+-----------+------------------+           PSV cm/sEDV cm/sStenosisDescribe   Comments           +----------+--------+--------+--------+-----------+------------------+ CCA Prox  86      18                         intimal thickening +----------+--------+--------+--------+-----------+------------------+ CCA Distal75      16                                            +----------+--------+--------+--------+-----------+------------------+ ICA Prox  59      20      1-39%   homogeneous                   +----------+--------+--------+--------+-----------+------------------+ ICA Mid   71      28                                            +----------+--------+--------+--------+-----------+------------------+ ICA Distal80      27                                            +----------+--------+--------+--------+-----------+------------------+  ECA       84      13                                            +----------+--------+--------+--------+-----------+------------------+ +----------+--------+-------+----------------+------------+           PSV cm/sEDV cmsDescribe        Arm Pressure +----------+--------+-------+----------------+------------+ Subclavian109            Multiphasic, WNL             +----------+--------+-------+----------------+------------+  +---------+--------+--+--------+--+---------+ VertebralPSV cm/s53EDV cm/s16Antegrade +---------+--------+--+--------+--+---------+ Left Carotid Findings: +----------+--------+--------+--------+---------------------+------------------+           PSV cm/sEDV cm/sStenosisDescribe             Comments           +----------+--------+--------+--------+---------------------+------------------+ CCA Prox  107     24                                                      +----------+--------+--------+--------+---------------------+------------------+ CCA Distal81      19                                   intimal thickening +----------+--------+--------+--------+---------------------+------------------+ ICA Prox  80      26      1-39%   homogeneous and                                                           smooth                                  +----------+--------+--------+--------+---------------------+------------------+ ICA Mid   71      26                                                      +----------+--------+--------+--------+---------------------+------------------+ ICA Distal76      26                                                      +----------+--------+--------+--------+---------------------+------------------+ ECA       85      9                                                       +----------+--------+--------+--------+---------------------+------------------+ +----------+--------+--------+----------------+------------+ SubclavianPSV cm/sEDV cm/sDescribe        Arm Pressure +----------+--------+--------+----------------+------------+           118  Multiphasic, WNL             +----------+--------+--------+----------------+------------+ +---------+--------+--+--------+--+---------+ VertebralPSV cm/s50EDV cm/s15Antegrade +---------+--------+--+--------+--+---------+  ABI Findings:  +---------+------------------+-----+---------+--------+ Right    Rt Pressure (mmHg)IndexWaveform Comment  +---------+------------------+-----+---------+--------+ Brachial 119                    triphasic         +---------+------------------+-----+---------+--------+ PTA      148               1.19 triphasic         +---------+------------------+-----+---------+--------+ DP       155               1.25 triphasic         +---------+------------------+-----+---------+--------+ Great Toe63                0.51 Abnormal          +---------+------------------+-----+---------+--------+ +---------+------------------+-----+---------+-------+ Left     Lt Pressure (mmHg)IndexWaveform Comment +---------+------------------+-----+---------+-------+ Brachial 124                    triphasic        +---------+------------------+-----+---------+-------+ PTA      141               1.14 triphasic        +---------+------------------+-----+---------+-------+ DP       155               1.25 triphasic        +---------+------------------+-----+---------+-------+ Great Toe62                0.50 Abnormal         +---------+------------------+-----+---------+-------+ +-------+---------------+----------------+ ABI/TBIToday's ABI/TBIPrevious ABI/TBI +-------+---------------+----------------+ Right  1.25/ 0.51                      +-------+---------------+----------------+ Left   1.25/ 0.50                      +-------+---------------+----------------+  Right Doppler Findings: +--------+--------+-----+---------+--------+ Site    PressureIndexDoppler  Comments +--------+--------+-----+---------+--------+ [id number]          triphasic         +--------+--------+-----+---------+--------+ Radial               triphasic         +--------+--------+-----+---------+--------+ Ulnar                triphasic          +--------+--------+-----+---------+--------+  Left Doppler Findings: +--------+--------+-----+---------+--------+ Site    PressureIndexDoppler  Comments +--------+--------+-----+---------+--------+ [id number]          triphasic         +--------+--------+-----+---------+--------+ Radial               triphasic         +--------+--------+-----+---------+--------+ Ulnar                triphasic         +--------+--------+-----+---------+--------+   Summary: Right Carotid: Velocities in the right ICA are consistent with a 1-39% stenosis. Left Carotid: Velocities in the left ICA are consistent with a 1-39% stenosis. Vertebrals:  Bilateral vertebral arteries demonstrate antegrade flow. Subclavians: Normal flow hemodynamics were seen in bilateral subclavian              arteries. Right ABI: Resting right ankle-brachial index is within normal range. The right toe-brachial index is abnormal. Left ABI: Resting left ankle-brachial  index is within normal range. The left toe-brachial index is abnormal. Right Upper Extremity: Doppler waveforms decrease <50% with right radial compression. Doppler waveforms decrease 50% with right ulnar compression. Left Upper Extremity: Doppler waveforms decrease <50% w left radial compression. Doppler waveforms remain within normal limits with left ulnar compression.  Electronically signed by Lemar Livings MD on 07/23/2022 at 5:53:41 PM.    Final    DG Chest 2 View  Result Date: 07/23/2022 CLINICAL DATA:  Pre CABG. EXAM: CHEST - 2 VIEW COMPARISON:  Cardiac CT dated July 12, 2022. FINDINGS: The heart size and mediastinal contours are within normal limits. Normal pulmonary vascularity. New trace left pleural effusion. No consolidation or pneumothorax. No acute osseous abnormality. IMPRESSION: 1. New trace left pleural effusion. Electronically Signed   By: Obie Dredge M.D.   On: 07/23/2022 13:26   ECHOCARDIOGRAM COMPLETE  Result Date: 07/21/2022    ECHOCARDIOGRAM  REPORT   Patient Name:   Walter George Date of Exam: 07/21/2022 Medical Rec #:  [id number]           Height:       69.0 in Accession #:    [id number]          Weight:       163.6 lb Date of Birth:  1945-03-19          BSA:          1.897 m Patient Age:    76 years            BP:           114/67 mmHg Patient Gender: M                   HR:           59 bpm. Exam Location:  Inpatient Procedure: 2D Echo, Color Doppler and Cardiac Doppler Indications:    Myocardial Infarction i21.9  History:        Patient has prior history of Echocardiogram examinations, most                 recent 02/13/2017. Arrythmias:Atrial Fibrillation.  Sonographer:    Irving Burton Senior RDCS Referring Phys: [name] IMPRESSIONS  1. Left ventricular ejection fraction, by estimation, is 55 to 60%. The left ventricle has low normal function. The left ventricle has no regional wall motion abnormalities. Left ventricular diastolic parameters were normal.  2. Right ventricular systolic function is normal. The right ventricular size is normal. Tricuspid regurgitation signal is inadequate for assessing PA pressure.  3. The mitral valve is grossly normal. Trivial mitral valve regurgitation.  4. The aortic valve is tricuspid. There is moderate calcification of the aortic valve. There is moderate thickening of the aortic valve. Aortic valve regurgitation is not visualized. Aortic valve sclerosis/calcification is present, without any evidence of aortic stenosis.  5. The inferior vena cava is dilated in size with <50% respiratory variability, suggesting right atrial pressure of 15 mmHg.  6. 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 Evidence of atrial level shunting detected by color flow Doppler. Comparison(s): No significant change from prior study. FINDINGS  Left Ventricle: Left ventricular ejection fraction, by estimation, is 55 to 60%. The left ventricle has low normal function. The left ventricle has no regional wall motion  abnormalities. The left ventricular internal cavity size was normal in size. There is no left ventricular hypertrophy. Left ventricular diastolic parameters were normal. Right Ventricle: The right ventricular size is normal.  No increase in right ventricular wall thickness. Right ventricular systolic function is normal. Tricuspid regurgitation signal is inadequate for assessing PA pressure. Left Atrium: Left atrial size was normal in size. Right Atrium: Right atrial size was normal in size. Pericardium: There is no evidence of pericardial effusion. Mitral Valve: The mitral valve is grossly normal. Trivial mitral valve regurgitation. Tricuspid Valve: The tricuspid valve is normal in structure. Tricuspid valve regurgitation is trivial. Aortic Valve: The aortic valve is tricuspid. There is moderate calcification of the aortic valve. There is moderate thickening of the aortic valve. Aortic valve regurgitation is not visualized. Aortic valve sclerosis/calcification is present, without any  evidence of aortic stenosis. Pulmonic Valve: The pulmonic valve was grossly normal. Pulmonic valve regurgitation is trivial. Aorta: The aortic root and ascending aorta are structurally normal, with no evidence of dilitation. Venous: The inferior vena cava is dilated in size with less than 50% respiratory variability, suggesting right atrial pressure of 15 mmHg. IAS/Shunts: Evidence of atrial level shunting detected by color flow Doppler.  LEFT VENTRICLE PLAX 2D LVIDd:         4.20 cm   Diastology LVIDs:         3.10 cm   LV e' medial:    10.90 cm/s LV PW:         0.70 cm   LV E/e' medial:  6.0 LV IVS:        0.90 cm   LV e' lateral:   11.50 cm/s LVOT diam:     2.10 cm   LV E/e' lateral: 5.7 LV SV:         50 LV SV Index:   26 LVOT Area:     3.46 cm  RIGHT VENTRICLE RV S prime:     12.70 cm/s TAPSE (M-mode): 1.9 cm LEFT ATRIUM             Index        RIGHT ATRIUM           Index LA diam:        3.20 cm 1.69 cm/m   RA Area:     20.10  cm LA Vol (A2C):   53.0 ml 27.94 ml/m  RA Volume:   54.30 ml  28.63 ml/m LA Vol (A4C):   50.2 ml 26.47 ml/m LA Biplane Vol: 51.4 ml 27.10 ml/m  AORTIC VALVE LVOT Vmax:   73.10 cm/s LVOT Vmean:  54.600 cm/s LVOT VTI:    0.143 m  AORTA Ao Root diam: 3.50 cm Ao Asc diam:  3.60 cm MITRAL VALVE MV Area (PHT): 2.61 cm    SHUNTS MV Decel Time: 291 msec    Systemic VTI:  0.14 m MV E velocity: 65.70 cm/s  Systemic Diam: 2.10 cm MV A velocity: 31.50 cm/s MV E/A ratio:  2.09 Laurance Flatten MD Electronically signed by Laurance Flatten MD Signature Date/Time: 07/21/2022/4:01:09 PM    Final    CARDIAC CATHETERIZATION  Result Date: 07/21/2022   Prox RCA lesion is 30% stenosed.   Mid RCA lesion is 95% stenosed.   Mid RCA to Dist RCA lesion is 60% stenosed.   Ost Cx to Prox Cx lesion is 90% stenosed.   Ramus lesion is 99% stenosed.   1st Diag lesion is 100% stenosed.   Prox LAD to Mid LAD lesion is 70% stenosed.   The left ventricular systolic function is normal.   LV end diastolic pressure is normal.   The left ventricular ejection fraction is 55-65% by visual  estimate. Severe calcified mid LAD stenosis (RFR 0.88). Chronic occlusion of the small caliber Diagonal branch. This branch fills from right to left collaterals Severe ostial Circumflex stenosis Severe ostial/proximal intermediate branch stenosis. Large dominant RCA with heavy calcification throughout. The mid vessel has a severe calcified stenosis. The distal vessel has a moderate calcified stenosis. Normal LV filling pressure Recommendations: His presentation and EKG changes tonight could represent non-ACS chest pain post ablation. 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 (LAD stenosis is flow limiting by pressure wire analysis tonight). Will admit and continue cardiac monitoring. Echo later today. Will hold Eliquis for now and start IV heparin post sheath pull. CT surgery will need to be called to discuss CABG. If his  troponin is negative, he may be able to be discharged home and come back for CABG. Will start ASA and continue statin.   EP STUDY  Result Date: 07/19/2022 SURGEON:  Loman Brooklyn, MD PREPROCEDURE DIAGNOSES: 1. Paroxysmal atrial fibrillation. 2. Typical appearing atrial flutter POSTPROCEDURE DIAGNOSES: 1. Paroxysmal  atrial fibrillation. 2. Typical appearing atrial flutter PROCEDURES: 1. Comprehensive electrophysiologic study. 2. Coronary sinus pacing and recording. 3. Three-dimensional mapping of atrial fibrillation with additional mapping and ablation of a second discrete focus (atrial flutter) 4. Ablation of atrial fibrillation with additional mapping and ablation of a second discrete focus (atrial flutter) 5. Intracardiac echocardiography. 6. Transseptal puncture of an intact septum. 7. Arrhythmia induction with pacing with isuprel infusion INTRODUCTION:  Walter George is a 77 y.o. male with a history of paroxysmal atrial fibrillation and typical appearing atrial flutter who now presents for EP study and radiofrequency ablation.  The patient reports initially being diagnosed with atrial fibrillation after presenting with symptomatic palpitations and fatgiue. The patient reports increasing frequency and duration of atrial arrhythmias since that time.  The patient therefore presents today for catheter ablation of atrial fibrillation and atrial flutter. DESCRIPTION OF PROCEDURE:  Informed written consent was obtained, and the patient was brought to the electrophysiology lab in a fasting state.  The patient was adequately sedated with intravenous medications as outlined in the anesthesia report.  The patient's left and right groins were prepped and draped in the usual sterile fashion by the EP lab staff.  Using a percutaneous Seldinger technique, two 8-French hemostasis sheaths were placed in the right femoral vein, and one 7 [place] and one 11-French hemostasis sheaths were placed into the left common femoral  vein. An esophageal temperature probe was inserted to monitor for heating of the esophagus during the procedure.  Each sheath site was preclosed using an Abbott Perclose and closed at the end of the case. Direct ultrasound guidance is used for right and left femoral veins with normal vessel patency. Ultrasound images are captured and stored in the patient's chart. Using ultrasound guidance, the Brockenbrough needle and wire were visualized entering the vessel. Catheter Placement:  A 7-French Biosense Webster Decapolar coronary sinus catheter was introduced through the right common femoral vein and advanced into the coronary sinus for recording and pacing from this location.  A luminal esophageal temperature probe was placed and used for continuous monitoring of the luminal esophageal temperature throughout the procedure as well as to localize the esophagus on fluoroscopy. In addition, the esophagus was directly visualized with intracardiac echo and its positioned marked on Carto.  During ablation at the posterior wall there was limited esophageal heating noted during RF energy delivery with the maximal temperature recorded by the luminal temperature probe  of < 38.5 degrees C. Initial Measurements: The patient presented to the electrophysiology lab in sinus rhythm. his  PR interval measured 170 msec with a QRS duration of 69 msec and a QT interval of 428 msec.    Intracardiac Echocardiography: An 8-French Biosense Webster AcuNav intracardiac echocardiography catheter was introduced through the right common femoral vein and advanced into the right atrium. Intracardiac echocardiography was performed of the left atrium, and a three-dimensional anatomical rendering of the left atrium was performed using CARTO sound technology.  The patient was noted to have a moderate sized left atrium.  The interatrial septum was prominent but not aneurysmal. All 4 pulmonary veins were visualized and noted to have separate ostia.  The  pulmonary veins were moderate in size.  The left atrial appendage was visualized and did not reveal thrombus.   There was no evidence of pulmonary vein stenosis. Transseptal Puncture: The right common femoral vein sheaths were exchanged for one 8.5 [employer] and one Bayless sheath and transseptal access was achieved with the Bayless in a standard fashion using a Bayless needle under fluoroscopy with intracardiac echocardiography confirmation of the transseptal puncture.  Once transseptal access had been achieved, heparin was administered intravenously and intra- arterially in order to maintain an ACT of greater than 350 seconds throughout the procedure. 3D Mapping and Ablation: A 3.5 mm Biosense Webster SmartTouch Thermocool ablation catheter was advanced into the right atrium through the Visigo sheath.  The transseptal sheath was pulled back into the IVC over a guidewire.  The ablation catheter was advanced across the transseptal hole using the wire as a guide.  The transseptal sheath was then re-advanced over the guidewire into the left atrium.  A Biosense Webster octarray mapping catheter was introduced through the transseptal sheath and positioned over the mouth of all 4 pulmonary veins.  Three-dimensional electroanatomical mapping was performed using CARTO technology.  This demonstrated electrical activity within all four pulmonary veins at baseline. The patient underwent successful sequential electrical isolation and anatomical encircling of all four pulmonary veins using radiofrequency current with a circular mapping catheter as a guide.  A WACA approach was used.  Entrance and exit block were achieved. The ablation catheter was then pulled back into the right atrial and positioned along the cavo-tricuspid isthmus.  Mapping along the atrial side of the isthmus was performed.  This demonstrated a standard isthmus.  A series of radiofrequency applications were then delivered along the  isthmus.  Complete bidirectional cavotricuspid isthmus block was achieved as confirmed by differential atrial pacing from the low lateral right atrium.  A stimulus to earliest atrial activation across the isthmus measured 145 msec bi-directionally.  The patient was observe without return of conduction through the isthmus. Measurements Following Ablation: Following ablation, dobutamine was infused up to 20 mcg/min with no inducible atrial fibrillation, atrial tachycardia, atrial flutter, or sustained PACs. In sinus rhythm with RR interval was 774 msec, with PR 155 msec, QRS 69 msec, and QT 399 msec.  Following ablation the AH interval measured 77 msec with an HV interval of 43 msec. Ventricular pacing was performed, which revealed VA dissociation when pacing at 600 msec. Rapid atrial pacing was performed, which revealed an AV Wenckebach cycle length of 370 msec.  Electroisolation was then again confirmed in all four pulmonary veins. Intracardiac echocardiography was again performed, which revealed no pericardial effusion.  The procedure was therefore considered completed.  All catheters were removed, and the sheaths were aspirated and flushed.  The patient  was transferred to the recovery area for sheath removal per protocol.  Intracardiac echocardiogram revealed no pericardial effusion. EBL<39ml.  There were no early apparent complications. CONCLUSIONS: 1. Sinus rhythm upon presentation.  2. Successful electrical isolation and anatomical encircling of all four pulmonary veins with radiofrequency current.   3. Cavo-tricuspid isthmus ablation was performed with complete bidirectional isthmus block achieved. 4. No inducible arrhythmias following ablation both on and off of dobutamine 5. No early apparent complications. Will Camnitz,MD 9:03 AM 07/19/2022   CT CARDIAC MORPH/PULM VEIN W/CM&W/O CA SCORE  Addendum Date: 07/17/2022   ADDENDUM REPORT: 07/17/2022 14:46 EXAM: OVER-READ INTERPRETATION  CT CHEST The following  report is an over-read performed by radiologist Dr. Alver Fisher [facility] on 07/17/2022. This over-read does not include interpretation of cardiac or coronary anatomy or pathology. The interpretation by the cardiologist is attached. COMPARISON:  12/28/2018. FINDINGS: Scout view is grossly unremarkable. Scattered subpleural lymph nodes along the fissures. No acute extracardiac findings. IMPRESSION: No extracardiac findings requiring follow-up. Electronically Signed   By: Leanna Battles M.D.   On: 07/17/2022 14:46   Result Date: 07/17/2022 CLINICAL DATA:  72M with atrial fibrillation scheduled for ablation. EXAM: Cardiac CTA TECHNIQUE: A non-contrast, gated CT scan was obtained with axial slices of 3 mm through the heart for calcium scoring. Calcium scoring was performed using the Agatston method. A 120 kV retrospective, gated, contrast cardiac scan was obtained. Gantry rotation speed was 250 msecs and collimation was 0.6 mm. Nitroglycerin was not given. A delayed scan was obtained to exclude left atrial appendage thrombus. The 3D dataset was reconstructed in 5% intervals of the 0-95% of the R-R cycle. Late systolic phases were analyzed on a dedicated workstation using MPR, MIP, and VRT modes. The patient received 80 cc of contrast. FINDINGS: Image quality: Excellent. Noise artifact is: Limited. Pulmonary Veins: There is normal pulmonary vein drainage into the left atrium (2 on the right and 1 on the left). RUPV: 22.4 x 19.0 mm RLPV: 21.5 x 18.0 mm LPV: 44.1 x 24.8 mm Left Atrium: The left atrial size is normal. There is no PFO/ASD. The left atrial appendage is large broccoli type without thrombus on contrast or delayed imaging. The esophagus runs in the left atrial midline and is not in proximity to any of the pulmonary vein ostia. Coronary Arteries: CAC score of 2599, which is 93rd percentile for age-, race-, and sex-matched controls. Normal coronary origin. Right dominance. The study was  performed without use of NTG and is insufficient for plaque evaluation. Right Atrium: Right atrial size is within normal limits. Right Ventricle: The right ventricular cavity is within normal limits. Left Ventricle: The ventricular cavity size is within normal limits. Pericardium: Normal thickness without significant effusion or calcium present. Pulmonary Artery: Normal caliber. Cardiac valves: The aortic valve is trileaflet. Mild calcification. The mitral valve is a normal structure without significant calcification. Aorta: Normal caliber.  Aortic atherosclerosis. Extra-cardiac findings: See attached radiology report for non-cardiac structures. IMPRESSION: 1. There is normal pulmonary vein drainage into the left atrium with ostial measurements above. There are two pulmonary veins on the right and one on the left. 2. There is no thrombus in the left atrial appendage. 3. The esophagus runs in the left atrial midline and is not in proximity to any of the pulmonary vein ostia. 4. No PFO/ASD. 5. Normal coronary origin. Right dominance. 6. CAC score of 2599 which is 93rd percentile for age-, race-, and sex-matched controls. 7.  Aortic atherosclerosis. Chilton Si, MD  Electronically Signed: By: Chilton Si M.D. On: 07/12/2022 18:20   CT ABDOMEN PELVIS W CONTRAST  Result Date: 07/08/2022 CLINICAL DATA:  Unintentional weight loss. Episodes of vomiting diarrhea nausea for 5 weeks. EXAM: CT ABDOMEN AND PELVIS WITH CONTRAST TECHNIQUE: Multidetector CT imaging of the abdomen and pelvis was performed using the standard protocol following bolus administration of intravenous contrast. RADIATION DOSE REDUCTION: This exam was performed according to the departmental dose-optimization program which includes automated exposure control, adjustment of the mA and/or kV according to patient size and/or use of iterative reconstruction technique. CONTRAST:  85mL OMNIPAQUE IOHEXOL 300 MG/ML  SOLN COMPARISON:  None Available.  FINDINGS: Lower chest: Unremarkable. Hepatobiliary: No suspicious focal abnormality within the liver parenchyma. There is no evidence for gallstones, gallbladder wall thickening, or pericholecystic fluid. No intrahepatic or extrahepatic biliary dilation. Pancreas: No focal mass lesion. No dilatation of the main duct. No intraparenchymal cyst. No peripancreatic edema. Spleen: No splenomegaly. No focal mass lesion. Adrenals/Urinary Tract: No adrenal nodule or mass. Kidneys unremarkable. No evidence for hydroureter. The urinary bladder appears normal for the degree of distention. Stomach/Bowel: Stomach is unremarkable. No gastric wall thickening. No evidence of outlet obstruction. Duodenum is normally positioned as is the ligament of Treitz. No small bowel wall thickening. No small bowel dilatation. The appendix is not well visualized, but there is no edema or inflammation in the region of the cecum. No gross colonic mass. No colonic wall thickening. Vascular/Lymphatic: There is moderate atherosclerotic calcification of the abdominal aorta without aneurysm. Small retroperitoneal lymph nodes evident without evidence for abdominal lymphadenopathy. Multiple small lymph nodes are seen in the root of the central small bowel mesentery. No pelvic sidewall lymphadenopathy. Reproductive: Prostate gland is mildly enlarged. Other: No intraperitoneal free fluid. Musculoskeletal: No worrisome lytic or sclerotic osseous abnormality. IMPRESSION: 1. No acute findings in the abdomen or pelvis. Specifically, no findings to explain the patient's history of weight loss. 2. Multiple small lymph nodes in the root of the central small bowel mesentery. These are nonspecific but are likely reactive. 3. Mild prostatomegaly. 4.  Aortic Atherosclerosis (ICD10-I70.0). Electronically Signed   By: Kennith Center M.D.   On: 07/08/2022 12:55      Results for orders placed or performed during the hospital encounter of 07/20/22 (from the past 48  hour(s))  Basic metabolic panel     Status: Abnormal   Collection Time: 07/31/22  1:54 AM  Result Value Ref Range   Sodium 133 (L) 135 - 145 mmol/L   Potassium 3.7 3.5 - 5.1 mmol/L   Chloride 103 98 - 111 mmol/L   CO2 22 22 - 32 mmol/L   Glucose, Bld 121 (H) 70 - 99 mg/dL    Comment: Glucose reference range applies only to samples taken after fasting for at least 8 hours.   BUN 35 (H) 8 - 23 mg/dL   Creatinine, Ser 6.96 (H) 0.61 - 1.24 mg/dL   Calcium 7.8 (L) 8.9 - 10.3 mg/dL   GFR, Estimated 52 (L) >60 mL/min    Comment: (NOTE) Calculated using the CKD-EPI Creatinine Equation (2021)    Anion gap 8 5 - 15    Comment: Performed at [facility], [address] [place], [place] [address]  CBC     Status: Abnormal   Collection Time: 07/31/22  1:54 AM  Result Value Ref Range   WBC 9.7 4.0 - 10.5 K/uL   RBC 2.70 (L) 4.22 - 5.81 MIL/uL   Hemoglobin 8.3 (L) 13.0 - 17.0  g/dL   HCT 16.1 (L) 09.6 - 04.5 %   MCV 92.2 80.0 - 100.0 fL   MCH 30.7 26.0 - 34.0 pg   MCHC 33.3 30.0 - 36.0 g/dL   RDW 40.9 81.1 - 91.4 %   Platelets 382 150 - 400 K/uL   nRBC 0.0 0.0 - 0.2 %    Comment: Performed at [facility], [address], [place], [place] [address]  Basic metabolic panel     Status: Abnormal   Collection Time: 08/01/22  1:40 AM  Result Value Ref Range   Sodium 135 135 - 145 mmol/L   Potassium 3.7 3.5 - 5.1 mmol/L   Chloride 105 98 - 111 mmol/L   CO2 20 (L) 22 - 32 mmol/L   Glucose, Bld 117 (H) 70 - 99 mg/dL    Comment: Glucose reference range applies only to samples taken after fasting for at least 8 hours.   BUN 32 (H) 8 - 23 mg/dL   Creatinine, Ser 6.21 (H) 0.61 - 1.24 mg/dL   Calcium 8.0 (L) 8.9 - 10.3 mg/dL   GFR, Estimated 50 (L) >60 mL/min    Comment: (NOTE) Calculated using the CKD-EPI Creatinine Equation (2021)    Anion gap 10 5 - 15    Comment: Performed at [facility], [address], [place], [place] [address]  CBC     Status: Abnormal   Collection  Time: 08/01/22  1:40 AM  Result Value Ref Range   WBC 8.8 4.0 - 10.5 K/uL   RBC 2.71 (L) 4.22 - 5.81 MIL/uL   Hemoglobin 8.4 (L) 13.0 - 17.0 g/dL   HCT 78.4 (L) 69.6 - 29.5 %   MCV 93.4 80.0 - 100.0 fL   MCH 31.0 26.0 - 34.0 pg   MCHC 33.2 30.0 - 36.0 g/dL   RDW 28.4 13.2 - 44.0 %   Platelets 455 (H) 150 - 400 K/uL   nRBC 0.0 0.0 - 0.2 %    Comment: Performed at [facility], [address] [place], [place] [address]     Treatments: surgery:    Operative Report    DATE OF PROCEDURE: 07/24/2022   PREOPERATIVE DIAGNOSIS:  Three-vessel coronary disease, status post non-ST elevation MI and atrial fibrillation, status post ablation.   POSTOPERATIVE DIAGNOSIS:  Three-vessel coronary disease, status post non-ST elevation MI and atrial fibrillation, status post ablation.   PROCEDURE PERFORMED:  Median sternotomy, extracorporeal circulation,  Coronary artery bypass grafting x5  Left internal mammary artery to LAD,  Saphenous vein graft to first diagonal,  Sequential saphenous vein graft to ramus intermedius and obtuse marginal,  Saphenous vein graft to distal right coronary artery with endarterectomy,  Left atrial appendage clip using 45 mm AtriCure flex-V AtriClip  Endoscopic vein harvest right leg.   SURGEON:  Salvatore Decent. Walter Fetch, MD   ASSISTANT:  Lowella Dandy, PA   ANESTHESIA:  General.  Discharge Exam: Blood pressure 101/68, pulse 64, temperature 97.9 F (36.6 C), temperature source Oral, resp. rate 16, height 5\' 9"  (1.753 m), weight 73.8 kg, SpO2 97 %.  General appearance: alert, cooperative, and no distress Heart: regular rate and rhythm Lungs: dim in bases Abdomen: benign Extremities: minor RLE edema Wound: incis healing well   Discharge Medications:  The patient has been discharged on:   1.Beta Blocker:  Yes [  y ]  No   [   ]                              If No, reason:  2.Ace Inhibitor/ARB: Yes [   ]                                      No  [   n ]                                     If No, reason: Renal insufficiency and labile blood pressure  3.Statin:   Yes [  y ]                  No  [   ]                  If No, reason:  4.Ecasa:  Yes  [  y ]                  No   [   ]                  If No, reason:  Patient had ACS upon admission:n  Plavix/P2Y12 inhibitor: Yes [   ]                                      No  [  n ]: No apixaban and aspirin     Discharge Instructions     Amb Referral to Cardiac Rehabilitation   Complete by: As directed    Wants to go to [facility] for CRP2   Diagnosis: CABG   CABG X ___: 5   After initial evaluation and assessments completed: Virtual Based Care may be provided alone or in conjunction with Phase 2 Cardiac Rehab based on patient barriers.: Yes   Intensive Cardiac Rehabilitation (ICR) [facility] location only OR Traditional Cardiac Rehabilitation (TCR) *If criteria for ICR are not met will enroll in TCR [facility] only): Yes   Discharge patient   Complete by: As directed    Discharge disposition: 01-Home or Self Care   Discharge patient date: 08/01/2022      Allergies as of 08/01/2022       Reactions   Amoxicillin Nausea And Vomiting        Medication List     STOP taking these medications    ramipril 1.25 MG capsule Commonly known as: ALTACE       TAKE these medications    acetaminophen 500 MG tablet Commonly known as: TYLENOL Take 1,000 mg by mouth every 6 (six) hours as needed for headache or moderate pain.   amiodarone 200 MG tablet Commonly known as: PACERONE Take 1 tablet (200 mg total) by mouth 2 (two) times daily. For 7 days, then 200 mg once daily   apixaban 5 MG Tabs tablet Commonly known as: Eliquis Take 1 tablet (5 mg total) by mouth 2 (two) times daily.   aspirin EC 81 MG tablet Take 1 tablet (81 mg total) by mouth daily. Swallow whole.   atorvastatin 80 MG tablet Commonly known as: LIPITOR Take 1 tablet (80 mg total) by  mouth daily.   dapagliflozin propanediol 10 MG Tabs tablet Commonly known as: FARXIGA Take 1 tablet (10 mg total) by mouth daily.   docusate sodium 100 MG capsule Commonly known as: COLACE Take 2 capsules (200 mg total) by mouth daily.   ezetimibe 10 MG tablet Commonly known as: Zetia Take 1 tablet (10 mg total) by mouth daily.   furosemide 20 MG tablet Commonly known as: LASIX Take 1 tablet (20 mg total) by mouth every other day. Start taking on: Aug 03, 2022   metoprolol succinate 25 MG 24 hr tablet Commonly known as: TOPROL-XL Take 0.5 tablets (12.5 mg total) by mouth daily.   multivitamin with minerals Tabs tablet Take 1 tablet by mouth daily.   oxyCODONE 5 MG immediate release tablet Commonly known as: Oxy IR/ROXICODONE Take 1 tablet (5 mg total) by mouth every 6 (six) hours as needed for up to 7 days for severe pain.   potassium chloride 10 MEQ tablet Commonly known as: KLOR-CON M Take 1 tablet (10 mEq total) by mouth every other day.   tamsulosin 0.4 MG Caps capsule Commonly known as: FLOMAX Take 1 capsule (0.4 mg total) by mouth daily after breakfast.   TURMERIC PO Take 2 tablets by mouth daily.               Durable Medical Equipment  (From admission, onward)           Start     Ordered   08/01/22 1131  For home use only DME 4 wheeled rolling walker with seat  Once       Comments: S/p CABG  Question:  Patient needs a walker to treat with the following condition  Answer:  Physical deconditioning   08/01/22 1130            Follow-up Information     [name], MD Follow up on 08/20/2022.   Specialty: Cardiothoracic Surgery Why: Appointment is at 1:30 Contact information: [address] [place] [address] [phone]         [facility] Follow up on 08/20/2022.   Why: Please get CXR at 12:30, prior to your appointment at Dr. Sunday Corn office Contact information: [address]  [place] [address]        [facility] Follow up on 08/09/2022.   Specialty: Cardiology Why: Follow-up wiht Dr. Shirlee Latch 820 Contact information: [address] [id number] Wilhemina Bonito [place] [address] [phone]         [facility] Follow up on 08/06/2022.   Specialty: Cardiothoracic Surgery Why: Appointment is 12:45, please get blood drawn on 5/20 Contact information: [address] [place], [address] [place] [address] [phone]        [facility] Follow up.   Why: (Adapt)- rollator arranged- to be delivered to room prior to discharge Contact information: [address] [facility] [place] [address] [phone]         [facility] Follow up.   Why: Iantha Fallen)- HH preop referral made for [facility] needs- they will contact you post discharge Contact information: [address] [place] [address] [phone]                 Signed:  Rowe Clack, PA-C  08/01/2022, 2:24 PM

## 2022-08-01 NOTE — Progress Notes (Addendum)
[address]       [place] [address]             [phone]     8 Days Post-Op Procedure(s) (LRB): CORONARY ARTERY BYPASS GRAFTING (CABG) X5 USING LEFT INTERNAL MAMMARY ARTERY AND ENDOSCOPICALLY HARVESTED RIGHT GREATER SAPHENOUS VEIN (N/A) TRANSESOPHAGEAL ECHOCARDIOGRAM (N/A) CLIPPING OF ATRIAL APPENDAGE USING ATRICURE ATRICLIP SIZE (Left) RIGHT CORONARY ENDARTERECETOMY (Right) Subjective: Feels pretty well overall  Objective: Vital signs in last 24 hours: Temp:  [98 F (36.7 C)-98.5 F (36.9 C)] 98 F (36.7 C) (05/15 2340) Pulse Rate:  [68-77] 69 (05/16 0634) Cardiac Rhythm: Normal sinus rhythm (05/15 2100) Resp:  [16-23] 20 (05/16 0634) BP: (96-112)/(60-75) 111/75 (05/16 0634) SpO2:  [98 %-100 %] 99 % (05/16 0634) Weight:  [73.8 kg] 73.8 kg (05/16 0634)  Hemodynamic parameters for last 24 hours:    Intake/Output from previous day: 05/15 0701 - 05/16 0700 In: 369.1 [I.V.:369.1] Out: 450 [Urine:450] Intake/Output this shift: No intake/output data recorded.  General appearance: alert, cooperative, and no distress Heart: regular rate and rhythm Lungs: dim in bases Abdomen: benign Extremities: minor RLE edema Wound: incis healing well  Lab Results: Recent Labs    07/31/22 0154 08/01/22 0140  WBC 9.7 8.8  HGB 8.3* 8.4*  HCT 24.9* 25.3*  PLT 382 455*   BMET:  Recent Labs    07/31/22 0154 08/01/22 0140  NA 133* 135  K 3.7 3.7  CL 103 105  CO2 22 20*  GLUCOSE 121* 117*  BUN 35* 32*  CREATININE 1.40* 1.45*  CALCIUM 7.8* 8.0*    PT/INR: No results for input(s): "LABPROT", "INR" in the last 72 hours. ABG    Component Value Date/Time   PHART 7.345 (L) 07/24/2022 2135   HCO3 19.2 (L) 07/24/2022 2135   TCO2 20 (L) 07/24/2022 2135   ACIDBASEDEF 6.0 (H) 07/24/2022 2135   O2SAT 53.1 07/26/2022 0540   CBG (last 3)  No results for input(s): "GLUCAP" in the last 72 hours.  Meds Scheduled Meds:  acetaminophen  1,000 mg Oral  Once   apixaban  5 mg Oral BID   aspirin EC  81 mg Oral Daily   atorvastatin  80 mg Oral Daily   dapagliflozin propanediol  10 mg Oral Daily   docusate sodium  200 mg Oral Daily   ezetimibe  10 mg Oral Daily   furosemide  20 mg Oral Daily   metoprolol succinate  12.5 mg Oral Daily   pantoprazole  40 mg Oral Daily   polyethylene glycol  17 g Oral Daily   senna  2 tablet Oral QHS   sodium chloride flush  3 mL Intravenous Q12H   sodium phosphate  1 enema Rectal Once   tamsulosin  0.4 mg Oral QPC breakfast   Continuous Infusions:  sodium chloride     amiodarone 30 mg/hr (08/01/22 0259)   PRN Meds:.sodium chloride, ipratropium-albuterol, metoCLOPramide (REGLAN) injection, ondansetron (ZOFRAN) IV, oxyCODONE, sodium chloride flush, traMADol  Xrays No results found.  Assessment/Plan: S/P Procedure(s) (LRB): CORONARY ARTERY BYPASS GRAFTING (CABG) X5 USING LEFT INTERNAL MAMMARY ARTERY AND ENDOSCOPICALLY HARVESTED RIGHT GREATER SAPHENOUS VEIN (N/A) TRANSESOPHAGEAL ECHOCARDIOGRAM (N/A) CLIPPING OF ATRIAL APPENDAGE USING ATRICURE ATRICLIP SIZE (Left) RIGHT CORONARY ENDARTERECETOMY (Right) POD#8  1  Afeb, VSS s BP 90's-110's, sinus rhythm, on low dose lopressor and eliquis, amio- midodrine has been stopped 2 O2 sats good on RA 3 voiding - ? If  all measured, wight stable for  3 days 4 creat 1.45 today, 1.40 yesterday, was given lasix 20 per AHF yesterday. On flomax for retention earlier post op - cont for now 5 on asa and statin 6 on farxiga per AHF GDMT recs 7 hyponatremia- resolved 8 H/H improved  9 thrombocytosis w/ plt ct 455K today, observe clinically 10 likely home after seen by AHF team if they agree     LOS: 11 days    Rowe Clack PA-C Pager [phone] 08/01/2022 Patient seen and examined, agree with above Home today  Viviann Spare C. Dorris Fetch, MD [facility] [phone]

## 2022-08-01 NOTE — Plan of Care (Signed)
  Problem: Health Behavior/Discharge Planning: Goal: Ability to manage health-related needs will improve Outcome: Progressing   Problem: Clinical Measurements: Goal: Respiratory complications will improve Outcome: Progressing   

## 2022-08-01 NOTE — Progress Notes (Cosign Needed)
    Durable Medical Equipment  (From admission, onward)           Start     Ordered   08/01/22 1131  For home use only DME 4 wheeled rolling walker with seat  Once       Comments: S/p CABG  Question:  Patient needs a walker to treat with the following condition  Answer:  Physical deconditioning   08/01/22 1130            

## 2022-08-02 ENCOUNTER — Encounter (HOSPITAL_COMMUNITY): Payer: Self-pay | Admitting: Thoracic Surgery (Cardiothoracic Vascular Surgery)

## 2022-08-02 NOTE — TOC Transition Note (Signed)
Transition of Care (TOC) - CM/SW Discharge Note Donn Pierini RN, BSN Transitions of Care Unit 4E- RN Case Manager See Treatment Team for direct phone #   Patient Details  Name: Walter George MRN: [id number] Date of Birth: [birth year]  Transition [facility]) CM/SW Contact:  Darrold Span, RN Phone Number: 08/02/2022, 12:06 PM   Clinical Narrative:    Pt stable for transition home today, per cardiac rehab pt needs rollator for home, order has been placed and CM has reached out to [employer] liaison to deliver DME to room prior to discharge.   Pt is OON for [facility] office protocol referral- however note that pt has no HH needs at this time- no further [facility] referral made.   Family to transport home.   Update- pt did not want to wait for rollator- requested [employer] deliver to home   Final next level of care: Home/Self Care Barriers to Discharge: Barriers Resolved   Patient Goals and CMS Choice CMS Medicare.gov Compare Post Acute Care list provided to:: Patient    Discharge Placement               Home           Discharge Plan and Services Additional resources added to the After Visit Summary for     Discharge Planning Services: CM Consult            DME Arranged: Walker rolling with seat DME Agency: [employer] Date DME Agency Contacted: 08/01/22 Time DME Agency Contacted: 1345 Representative spoke with at DME Agency: Barbara Cower HH Arranged: NA HH Agency: NA        Social Determinants of Health (SDOH) Interventions SDOH Screenings   Food Insecurity: No Food Insecurity (07/21/2022)  Housing: Low Risk  (07/21/2022)  Transportation Needs: No Transportation Needs (07/21/2022)  Utilities: Not At Risk (07/21/2022)  Tobacco Use: Low Risk  (07/25/2022)     Readmission Risk Interventions     No data to display

## 2022-08-05 ENCOUNTER — Other Ambulatory Visit: Payer: Self-pay | Admitting: *Deleted

## 2022-08-05 ENCOUNTER — Other Ambulatory Visit: Payer: Self-pay | Admitting: Thoracic Surgery (Cardiothoracic Vascular Surgery)

## 2022-08-05 ENCOUNTER — Telehealth (HOSPITAL_COMMUNITY): Payer: Self-pay

## 2022-08-05 DIAGNOSIS — Z951 Presence of aortocoronary bypass graft: Secondary | ICD-10-CM | POA: Diagnosis not present

## 2022-08-05 LAB — BASIC METABOLIC PANEL
BUN/Creatinine Ratio: 17 (calc) (ref 6–22)
BUN: 25 mg/dL (ref 7–25)
CO2: 22 mmol/L (ref 20–32)
Calcium: 8.9 mg/dL (ref 8.6–10.3)
Chloride: 105 mmol/L (ref 98–110)
Creat: 1.44 mg/dL — ABNORMAL HIGH (ref 0.70–1.28)
Glucose, Bld: 91 mg/dL (ref 65–139)
Potassium: 5.4 mmol/L — ABNORMAL HIGH (ref 3.5–5.3)
Sodium: 136 mmol/L (ref 135–146)

## 2022-08-05 NOTE — Telephone Encounter (Signed)
Per Phase 1 Cardiac Rehab fax referral to [facility].

## 2022-08-06 ENCOUNTER — Ambulatory Visit (INDEPENDENT_AMBULATORY_CARE_PROVIDER_SITE_OTHER): Payer: Self-pay | Admitting: Thoracic Surgery (Cardiothoracic Vascular Surgery)

## 2022-08-06 ENCOUNTER — Other Ambulatory Visit: Payer: Self-pay | Admitting: Thoracic Surgery (Cardiothoracic Vascular Surgery)

## 2022-08-06 ENCOUNTER — Encounter: Payer: Self-pay | Admitting: Thoracic Surgery (Cardiothoracic Vascular Surgery)

## 2022-08-06 ENCOUNTER — Ambulatory Visit
Admission: RE | Admit: 2022-08-06 | Discharge: 2022-08-06 | Disposition: A | Discharge status: Home / Self Care | Payer: Medicare Other | Source: Ambulatory Visit | Attending: Thoracic Surgery (Cardiothoracic Vascular Surgery) | Admitting: Thoracic Surgery (Cardiothoracic Vascular Surgery)

## 2022-08-06 VITALS — BP 110/66 | HR 59 | Resp 20 | Wt 164.0 lb

## 2022-08-06 DIAGNOSIS — J984 Other disorders of lung: Secondary | ICD-10-CM | POA: Diagnosis not present

## 2022-08-06 DIAGNOSIS — Z951 Presence of aortocoronary bypass graft: Secondary | ICD-10-CM | POA: Diagnosis not present

## 2022-08-06 DIAGNOSIS — J90 Pleural effusion, not elsewhere classified: Secondary | ICD-10-CM | POA: Diagnosis not present

## 2022-08-06 MED ORDER — FUROSEMIDE 20 MG PO TABS: 20.0000 mg | ORAL_TABLET | Freq: Every day | ORAL | 3 refills | Status: DC

## 2022-08-06 MED ORDER — PREDNISONE 10 MG (21) PO TBPK: ORAL_TABLET | ORAL | 0 refills | Status: AC

## 2022-08-06 NOTE — Progress Notes (Signed)
[address]       [place] [address]             [phone]       HPI: Mr. Stauffacher returns for a scheduled postoperative follow-up visit  Christian "Cindee Lame" Szakacs is a 77 year old gentleman with a history of atrial fibrillation, hyperlipidemia, coronary calcification, kidney stones, congestive heart failure, and three-vessel coronary disease.  He had an ablation for atrial fibrillation on 07/19/2022.  He presented back the following day with substernal chest pain.  This troponins were elevated although that may have been due to his procedure.  Cardiac catheterization showed severe three-vessel coronary disease.  He underwent coronary artery bypass grafting x 5 on 07/24/2022.  Postoperative course was notable for hypotension, mild acute kidney injury, urinary retention, and atrial fibrillation with rapid ventricular response.  He went home on postoperative day #8.  He is not having significant pain.  Not taking any narcotics.  Has not had any issues with voiding or urinary retention.  Does get tired and out of breath quickly.  Denies any orthopnea.  Some mild swelling in his legs  Past Medical History:  Diagnosis Date   Atrial fibrillation (HCC)    CHF (congestive heart failure) (HCC)    Kidney stones    3 stones, last 20 years ago.   Plantar fasciitis     Current Outpatient Medications  Medication Sig Dispense Refill   acetaminophen (TYLENOL) 500 MG tablet Take 1,000 mg by mouth every 6 (six) hours as needed for headache or moderate pain.     amiodarone (PACERONE) 200 MG tablet Take 1 tablet (200 mg total) by mouth 2 (two) times daily. For 7 days, then 200 mg once daily 37 tablet 1   apixaban (ELIQUIS) 5 MG TABS tablet Take 1 tablet (5 mg total) by mouth 2 (two) times daily. 180 tablet 1   aspirin EC 81 MG tablet Take 1 tablet (81 mg total) by mouth daily. Swallow whole. 30 tablet 12   atorvastatin (LIPITOR) 80 MG tablet Take 1 tablet (80 mg total) by mouth  daily. 90 tablet 3   dapagliflozin propanediol (FARXIGA) 10 MG TABS tablet Take 1 tablet (10 mg total) by mouth daily. 30 tablet 1   docusate sodium (COLACE) 100 MG capsule Take 2 capsules (200 mg total) by mouth daily. 10 capsule 0   ezetimibe (ZETIA) 10 MG tablet Take 1 tablet (10 mg total) by mouth daily. 90 tablet 3   furosemide (LASIX) 20 MG tablet Take 1 tablet (20 mg total) by mouth every other day. 15 tablet 1   metoprolol succinate (TOPROL-XL) 25 MG 24 hr tablet Take 0.5 tablets (12.5 mg total) by mouth daily. 30 tablet 1   Multiple Vitamin (MULTIVITAMIN WITH MINERALS) TABS tablet Take 1 tablet by mouth daily.     potassium chloride (KLOR-CON M) 10 MEQ tablet Take 1 tablet (10 mEq total) by mouth every other day. 15 tablet 1   tamsulosin (FLOMAX) 0.4 MG CAPS capsule Take 1 capsule (0.4 mg total) by mouth daily after breakfast. 30 capsule 1   TURMERIC PO Take 2 tablets by mouth daily.     oxyCODONE (OXY IR/ROXICODONE) 5 MG immediate release tablet Take 1 tablet (5 mg total) by mouth every 6 (six) hours as needed for up to 7 days for severe pain. (Patient not taking: Reported on 08/06/2022) 28 tablet 0   No current facility-administered medications for this visit.    Physical Exam BP 110/66  Pulse (!) 59   Resp 20   Wt 164 lb (74.4 kg)   SpO2 97%   BMI 24.22 kg/m  Well-appearing 77 year old man in no acute distress Alert and oriented x 3 with no focal deficits Lungs diminished breath sounds at left base, otherwise clear Cardiac regular rate and rhythm with normal S1 and S2 Sternum stable, incision clean dry and intact Minimal drainage from left chest tube site Leg incision well-healed, 1+ edema right leg, trace left leg  Diagnostic Tests: I personally reviewed his chest x-ray images.  There is a moderate left pleural effusion.  Impression: Brason "Cindee Lame" Biddy is a 77 year old gentleman with a history of atrial fibrillation, hyperlipidemia, coronary calcification, kidney  stones, congestive heart failure, and three-vessel coronary disease.  Three-vessel coronary disease-status post coronary bypass grafting x 5.  Unclear if it is presentation chest pain was angina or related to his ablation procedure.  No pain at that time since discharge.  Status post CABG x 5-plan on not driving for another couple of weeks.  Gradually increase activities as tolerated.  No lifting over 10 pounds.  Postoperative left pleural effusion-he is currently taking Lasix 20 mg every other day.  He will increase that to 20 mg daily.  In addition I am going to give him a prednisone taper.  Acute kidney injury-creatinine stable at 1.44.  Kidney was 5.4 so we will stop his potassium.  Atrial fibrillation-regular rhythm today although borderline bradycardic.  Amiodarone and Toprol.  Bradycardia could be contributing to his fatigue although that is probably mostly related to surgery and anemia.    Plan: Prednisone taper Increase Lasix to 20 mg daily Stop potassium Return in 2 weeks with PA and lateral chest x-ray  [name], MD [facility] [phone]

## 2022-08-07 MED FILL — Lidocaine HCl Local Soln Prefilled Syringe 100 MG/5ML (2%): INTRAMUSCULAR | Qty: 5 | Status: AC

## 2022-08-07 MED FILL — Calcium Chloride Inj 10%: INTRAVENOUS | Qty: 10 | Status: AC

## 2022-08-07 MED FILL — Heparin Sodium (Porcine) Inj 1000 Unit/ML: INTRAMUSCULAR | Qty: 20 | Status: AC

## 2022-08-07 MED FILL — Sodium Bicarbonate IV Soln 8.4%: INTRAVENOUS | Qty: 100 | Status: AC

## 2022-08-07 MED FILL — Heparin Sodium (Porcine) Inj 1000 Unit/ML: INTRAMUSCULAR | Qty: 30 | Status: AC

## 2022-08-07 MED FILL — Mannitol IV Soln 20%: INTRAVENOUS | Qty: 500 | Status: AC

## 2022-08-07 MED FILL — Electrolyte-R (PH 7.4) Solution: INTRAVENOUS | Qty: 3000 | Status: AC

## 2022-08-09 ENCOUNTER — Encounter (HOSPITAL_COMMUNITY): Payer: Medicare Other | Admitting: Cardiology

## 2022-08-09 ENCOUNTER — Ambulatory Visit (HOSPITAL_COMMUNITY)
Admit: 2022-08-09 | Discharge: 2022-08-09 | Disposition: A | Discharge status: Home / Self Care | Payer: Medicare Other | Attending: Cardiology | Admitting: Cardiology

## 2022-08-09 VITALS — BP 115/74 | HR 61 | Wt 163.0 lb

## 2022-08-09 DIAGNOSIS — Z79899 Other long term (current) drug therapy: Secondary | ICD-10-CM | POA: Diagnosis not present

## 2022-08-09 DIAGNOSIS — E785 Hyperlipidemia, unspecified: Secondary | ICD-10-CM | POA: Insufficient documentation

## 2022-08-09 DIAGNOSIS — I5032 Chronic diastolic (congestive) heart failure: Secondary | ICD-10-CM | POA: Diagnosis not present

## 2022-08-09 DIAGNOSIS — Z7984 Long term (current) use of oral hypoglycemic drugs: Secondary | ICD-10-CM | POA: Diagnosis not present

## 2022-08-09 DIAGNOSIS — Z7952 Long term (current) use of systemic steroids: Secondary | ICD-10-CM | POA: Insufficient documentation

## 2022-08-09 DIAGNOSIS — Z951 Presence of aortocoronary bypass graft: Secondary | ICD-10-CM | POA: Insufficient documentation

## 2022-08-09 DIAGNOSIS — Z7982 Long term (current) use of aspirin: Secondary | ICD-10-CM | POA: Insufficient documentation

## 2022-08-09 DIAGNOSIS — I4891 Unspecified atrial fibrillation: Secondary | ICD-10-CM | POA: Diagnosis not present

## 2022-08-09 DIAGNOSIS — Z7901 Long term (current) use of anticoagulants: Secondary | ICD-10-CM | POA: Diagnosis not present

## 2022-08-09 DIAGNOSIS — I4892 Unspecified atrial flutter: Secondary | ICD-10-CM | POA: Insufficient documentation

## 2022-08-09 DIAGNOSIS — Z8249 Family history of ischemic heart disease and other diseases of the circulatory system: Secondary | ICD-10-CM | POA: Insufficient documentation

## 2022-08-09 DIAGNOSIS — I2510 Atherosclerotic heart disease of native coronary artery without angina pectoris: Secondary | ICD-10-CM | POA: Diagnosis not present

## 2022-08-09 DIAGNOSIS — I4819 Other persistent atrial fibrillation: Secondary | ICD-10-CM

## 2022-08-09 LAB — COMPREHENSIVE METABOLIC PANEL
ALT: 33 U/L (ref 0–44)
AST: 42 U/L — ABNORMAL HIGH (ref 15–41)
Albumin: 3 g/dL — ABNORMAL LOW (ref 3.5–5.0)
Alkaline Phosphatase: 137 U/L — ABNORMAL HIGH (ref 38–126)
Anion gap: 10 (ref 5–15)
BUN: 25 mg/dL — ABNORMAL HIGH (ref 8–23)
CO2: 22 mmol/L (ref 22–32)
Calcium: 8.6 mg/dL — ABNORMAL LOW (ref 8.9–10.3)
Chloride: 105 mmol/L (ref 98–111)
Creatinine, Ser: 1.47 mg/dL — ABNORMAL HIGH (ref 0.61–1.24)
GFR, Estimated: 49 mL/min — ABNORMAL LOW (ref >60)
Glucose, Bld: 101 mg/dL — ABNORMAL HIGH (ref 70–99)
Potassium: 4.3 mmol/L (ref 3.5–5.1)
Sodium: 137 mmol/L (ref 135–145)
Total Bilirubin: 0.9 mg/dL (ref 0.3–1.2)
Total Protein: 6.1 g/dL — ABNORMAL LOW (ref 6.5–8.1)

## 2022-08-09 LAB — TSH: TSH: 5.182 u[IU]/mL — ABNORMAL HIGH (ref 0.350–4.500)

## 2022-08-09 LAB — BRAIN NATRIURETIC PEPTIDE: B Natriuretic Peptide: 519.4 pg/mL — ABNORMAL HIGH (ref 0.0–100.0)

## 2022-08-09 MED ORDER — AMIODARONE HCL 200 MG PO TABS: 200.0000 mg | ORAL_TABLET | Freq: Every day | ORAL | 3 refills | Status: DC

## 2022-08-09 MED ORDER — FUROSEMIDE 20 MG PO TABS: 20.0000 mg | ORAL_TABLET | Freq: Every day | ORAL | 3 refills | Status: DC

## 2022-08-09 NOTE — Patient Instructions (Signed)
CHANGE amiodarone to 200 mg daily.  Labs done today, your results will be available in MyChart, we will contact you for abnormal readings.  Your physician has requested that you have an echocardiogram. Echocardiography is a painless test that uses sound waves to create images of your heart. It provides your doctor with information about the size and shape of your heart and how well your heart's chambers and valves are working. This procedure takes approximately one hour. There are no restrictions for this procedure. Please do NOT wear cologne, perfume, aftershave, or lotions (deodorant is allowed). Please arrive 15 minutes prior to your appointment time.  Your physician recommends that you schedule a follow-up appointment in: 1 month  If you have any questions or concerns before your next appointment please send [place] a message through [place] or call our office at [phone].    TO LEAVE A MESSAGE FOR THE NURSE SELECT OPTION 2, PLEASE LEAVE A MESSAGE INCLUDING: YOUR NAME DATE OF BIRTH CALL BACK NUMBER REASON FOR CALL**this is important as we prioritize the call backs  YOU WILL RECEIVE A CALL BACK THE SAME DAY AS LONG AS YOU CALL BEFORE 4:00 PM  At the [facility], you and your health needs are our priority. As part of our continuing mission to provide you with exceptional heart care, we have created designated Provider Care Teams. These Care Teams include your primary Cardiologist (physician) and Advanced Practice Providers (APPs- Physician Assistants and Nurse Practitioners) who all work together to provide you with the care you need, when you need it.   You may see any of the following providers on your designated Care Team at your next follow up: Dr Arvilla Meres Dr Marca Ancona Dr. Marcos Eke, NP Robbie Lis, [place] [facility] [place], [place] Brynda Peon, NP Karle Plumber, PharmD   Please be sure to bring in all your medications  bottles to every appointment.    Thank you for choosing [facility]

## 2022-08-10 NOTE — Progress Notes (Addendum)
ID:  [name], DOB [birth year], MRN [id number]   Provider location: [place] [facility] Type of Visit: Established patient   PCP:  Rodrigo Ran, MD  Cardiologist:  Dr. Shirlee Latch   History of Present Illness: Walter George is a 77 y.o. with hyperlipidemia and abnormal calcium score scan who presents for followup of CAD and atrial flutter.  He had a screening coronary calcium score done in 10/20.  This showed 1654 Agatston units, placing him in the 91st percentile for his age and gender.  He is now on atorvastatin. Cardiolite in 10/20 showed no ischemia or infarction.  Of note, his younger brother died suddenly, autopsy showed he had a myocardial infarction.   He saw his PCP in 11/23, HR was irregular on exam and ECG showed atrial fibrillation or flutter.  He was sent to atrial fibrillation clinic, with ECG showing atrial flutter.  He went into atrial fibrillation and had DCCV in 1/24.  He was then seen by Dr. Elberta Fortis with plan for atrial fibrillation ablation. Of note, the pre-ablation CTA showed a significantly increased calcium score of 2599.   Patient had atrial fibrillation ablation on 5/3, the next day he noted pleuritic chest pain.  He went to the ER, and HS-TnI was elevated but without trend.  He ended up getting a heart catheterization showing severe chronic-appearing 3 vessel disease without a culprit lesion. Pleuritic chest pain and mild troponin elevation thought to be due to the atrial fibrillation ablation.  He was admitted and had CABG x 5 with LIMA-LAD, SVG-dRCA, SVG-D, sequential SVG-ramus and OM, LA appendage was clipped.  Pre-op echo showed EF 55-60% with normal RV.  Post-op course complicated by recurrent atrial fibrillation that resolved with amiodarone.  He had post-op hypotension requiring midodrine that was titrated off and post-op volume overload.   He returns today for post-hospital followup after CABG.  He has been doing well. He is in NSR,  has been in NSR on his Apple Watch since getting home.  Starting to do some walking.  No exertional dyspnea or chest pain.  No lightheadedness.  Mild ankle swelling.  No orthopnea/PND.    Labs (9/20): LDL 93, HDL 71, K 4.8, creatinine 1.3 Labs (11/20): K 4.8, creatinine 1.13 Labs (12/20): LDL 63, HDL 63 Labs (9/21): LDL 61, HDL 71, TGs 66 Labs (3/22): LDL 43, HDL 59 Labs (5/24): LDL 36, Lp(a) 16.1, K 5.4, creatinine 1.44  ECG (personally reviewed): NSR, inferolateral T wave inversions   PMH: 1. Nephrolithiasis 2. Hyperlipidemia 3. CAD: Coronary calcium score scan in 10/20 with 1654 Agatston units, placing the patient in the 91st percentile for age/gender. - Echo (11/18): EF 55-60%, mild LVH.   - ETT-Cardiolite (10/20) with 16 METS, no significant ST changes, EF 61%, no ischemia/infarction.  - Cath 5/24 with severe 3 vessel disease, had CABG x 5 with LIMA-LAD, SVG-dRCA, SVG-D, sequential SVG-ramus and OM 4. Atrial flutter/fibrillation: Diagnosed in 11/23 - DCCV 1/24 - Atrial fibrillation ablation 5/24 - LA appendage clipping with CABG 5/24 5. Chronic diastolic CHF: Echo (5/24) with EF 55-60%, normal RV.   SH: Married, retired Psychologist, occupational living in [place].  Occasional ETOH, never smoker.   FH: Father died with lymphoma, mother had dementia and died at 62.  Younger brother with SCD from MI.   ROS: All systems reviewed and negative except as per HPI.   Current Outpatient Medications  Medication Sig Dispense Refill   acetaminophen (TYLENOL) 500 MG tablet Take  1,000 mg by mouth every 6 (six) hours as needed for headache or moderate pain.     apixaban (ELIQUIS) 5 MG TABS tablet Take 1 tablet (5 mg total) by mouth 2 (two) times daily. 180 tablet 1   aspirin EC 81 MG tablet Take 1 tablet (81 mg total) by mouth daily. Swallow whole. 30 tablet 12   atorvastatin (LIPITOR) 80 MG tablet Take 1 tablet (80 mg total) by mouth daily. 90 tablet 3   dapagliflozin propanediol (FARXIGA) 10 MG TABS  tablet Take 1 tablet (10 mg total) by mouth daily. 30 tablet 1   ezetimibe (ZETIA) 10 MG tablet Take 1 tablet (10 mg total) by mouth daily. 90 tablet 3   metoprolol succinate (TOPROL-XL) 25 MG 24 hr tablet Take 0.5 tablets (12.5 mg total) by mouth daily. 30 tablet 1   Multiple Vitamin (MULTIVITAMIN WITH MINERALS) TABS tablet Take 1 tablet by mouth daily.     predniSONE (STERAPRED UNI-PAK 21 TAB) 10 MG (21) TBPK tablet Take 6 tablets (60 mg total) by mouth daily for 1 day, THEN 5 tablets (50 mg total) daily for 1 day, THEN 4 tablets (40 mg total) daily for 1 day, THEN 3 tablets (30 mg total) daily for 1 day, THEN 2 tablets (20 mg total) daily for 1 day, THEN 1 tablet (10 mg total) daily for 1 day. 21 tablet 0   tamsulosin (FLOMAX) 0.4 MG CAPS capsule Take 1 capsule (0.4 mg total) by mouth daily after breakfast. 30 capsule 1   amiodarone (PACERONE) 200 MG tablet Take 1 tablet (200 mg total) by mouth daily. 90 tablet 3   furosemide (LASIX) 20 MG tablet Take 1 tablet (20 mg total) by mouth daily. 90 tablet 3   No current facility-administered medications for this encounter.   BP 115/74   Pulse 61   Wt 73.9 kg (163 lb)   SpO2 100%   BMI 24.07 kg/m  General: NAD Neck: No JVD, no thyromegaly or thyroid nodule.  Lungs: Clear to auscultation bilaterally with normal respiratory effort. CV: Nondisplaced PMI.  Heart regular S1/S2, no S3/S4, no murmur.  1+ ankle edema.  No carotid bruit.  Normal pedal pulses.  Abdomen: Soft, nontender, no hepatosplenomegaly, no distention.  Skin: Intact without lesions or rashes.  Neurologic: Alert and oriented x 3.  Psych: Normal affect. Extremities: No clubbing or cyanosis.  HEENT: Normal.   Assessment/Plan: 1. CAD: S/p CABG in 5/24. Patient was admitted with pleuritic chest pain that was likely due to AF ablation the prior day, but had cath with 3VD (chronic lesions) resulting in CABG.  Doing well post-op, no chest pain.  - Continue ASA 81 for a year or so  post-CABG, then stop as he will be on Eliquis.  - Restart ramipril next visit, currently off due to low BP post-op.  - Continue atorvastatin 80 mg daily and Zetia 10 mg daily, excellent lipids in 5/24 and low Lp(a).  - Given progressive CAD despite excellent LDL and low Lp(a), I will send a Lipomed profile to assess LDL particle number.  - At next appointment in a month (to give some time post-surgery), will order HS-CRP.  He may be a candidate for colchicine therapy if CRP elevated.   - Start cardiac rehab, wants to do at [facility].  Will refer there.  2. Hyperlipidemia: Goal LDL < 55. Lp(a) not elevated.  - As above, send Lipomed profile for LDL particle number.  - For now, continue atorvastatin and Zetia. 3.  Atrial fibrillation/flutter: s/p AF ablation in 5/24 and LA appendage clipping with CABG.  - Continue Eliquis - He will remain on amiodarone for at least a month post-CABG.  Will decrease to 200 mg daily.  Check LFTs and TSH today.  4. Chronic diastolic CHF: Volume overload post-CABG, was diuresed in hospital.  Echo in 5/24 pre-op with EF 55-60%, normal RV.  - I will arrange for repeat echo post-op at 1 month followup.  - Continue Farxiga 10 mg daily.  - Continue Lasix 20 mg daily.  BMET/BNP today.   Followup in 1 month with echo.   Signed, Walter Ancona, MD  08/10/2022  [facility] [address] [facility] [place] [address] [phone] (office) [phone] (fax)

## 2022-08-13 ENCOUNTER — Telehealth (HOSPITAL_COMMUNITY): Payer: Self-pay

## 2022-08-13 LAB — NMR, LIPOPROFILE
Cholesterol, Total: 106 mg/dL (ref 100–199)
HDL Cholesterol by NMR: 40 mg/dL (ref >39)
HDL Particle Number: 21.1 umol/L — ABNORMAL LOW (ref >=30.5)
LDL Particle Number: 393 nmol/L (ref <1000)
LDL Size: 21 nm (ref >20.5)
LDL-C (NIH Calc): 50 mg/dL (ref 0–99)
LP-IR Score: 29 (ref <=45)
Small LDL Particle Number: <90 nmol/L (ref <=527)
Triglycerides by NMR: 80 mg/dL (ref 0–149)

## 2022-08-13 NOTE — Telephone Encounter (Signed)
Referral faxed to [facility] at [phone]

## 2022-08-16 ENCOUNTER — Ambulatory Visit (HOSPITAL_COMMUNITY): Payer: Medicare Other | Admitting: Physician Assistant

## 2022-08-19 ENCOUNTER — Other Ambulatory Visit: Payer: Self-pay | Admitting: Thoracic Surgery (Cardiothoracic Vascular Surgery)

## 2022-08-19 DIAGNOSIS — Z951 Presence of aortocoronary bypass graft: Secondary | ICD-10-CM

## 2022-08-20 ENCOUNTER — Encounter: Payer: Self-pay | Admitting: Thoracic Surgery (Cardiothoracic Vascular Surgery)

## 2022-08-20 ENCOUNTER — Ambulatory Visit
Admission: RE | Admit: 2022-08-20 | Discharge: 2022-08-20 | Disposition: A | Discharge status: Home / Self Care | Payer: Medicare Other | Source: Ambulatory Visit | Attending: Thoracic Surgery (Cardiothoracic Vascular Surgery) | Admitting: Thoracic Surgery (Cardiothoracic Vascular Surgery)

## 2022-08-20 ENCOUNTER — Ambulatory Visit (INDEPENDENT_AMBULATORY_CARE_PROVIDER_SITE_OTHER): Payer: Self-pay | Admitting: Thoracic Surgery (Cardiothoracic Vascular Surgery)

## 2022-08-20 ENCOUNTER — Ambulatory Visit: Payer: Medicare Other | Admitting: Thoracic Surgery (Cardiothoracic Vascular Surgery)

## 2022-08-20 VITALS — BP 109/64 | HR 72 | Resp 20 | Ht 69.0 in | Wt 150.0 lb

## 2022-08-20 DIAGNOSIS — J90 Pleural effusion, not elsewhere classified: Secondary | ICD-10-CM | POA: Diagnosis not present

## 2022-08-20 DIAGNOSIS — Z951 Presence of aortocoronary bypass graft: Secondary | ICD-10-CM

## 2022-08-20 NOTE — Progress Notes (Signed)
[address]       [place] [address]             [phone]       HPI: Mr. Walter George returns for a scheduled follow-up visit after recent coronary bypass grafting  Fraser Din "Cindee Lame" Neupert is a 77 year old gentleman with a history of atrial fibrillation, hyperlipidemia, coronary calcification, kidney stones, congestive heart failure, and three-vessel coronary disease.  He underwent atrial fibrillation ablation on 07/19/2022 but presented back the following day with chest pain.  Troponins were elevated and catheterization showed severe three-vessel coronary disease.  He underwent coronary bypass grafting x 5 on 07/24/2022.  Postoperatively he had issues with hypotension and mild acute kidney injury, urinary retention, and atrial fibrillation.  Finally went home on postoperative day #8.  I saw him back in the office on 08/06/2022.  He was having some mild swelling in his legs and his chest x-ray showed a moderate pleural effusion.  I gave him a steroid taper and increase his Lasix from 20 mg every other day to 20 mg daily.  In the interim since that visit he has been feeling much better.  He denies any anginal type chest pain.  Does have some mild incisional pain but is not taking any narcotics.  Back is not taking anything for pain.  Anxious to resume normal activities.  Hoping to get off of some of the medications.  Past Medical History:  Diagnosis Date   Atrial fibrillation (HCC)    CHF (congestive heart failure) (HCC)    Kidney stones    3 stones, last 20 years ago.   Plantar fasciitis     Current Outpatient Medications  Medication Sig Dispense Refill   acetaminophen (TYLENOL) 500 MG tablet Take 1,000 mg by mouth every 6 (six) hours as needed for headache or moderate pain.     amiodarone (PACERONE) 200 MG tablet Take 1 tablet (200 mg total) by mouth daily. 90 tablet 3   apixaban (ELIQUIS) 5 MG TABS tablet Take 1 tablet (5 mg total) by mouth 2 (two) times daily.  180 tablet 1   aspirin EC 81 MG tablet Take 1 tablet (81 mg total) by mouth daily. Swallow whole. 30 tablet 12   atorvastatin (LIPITOR) 80 MG tablet Take 1 tablet (80 mg total) by mouth daily. 90 tablet 3   dapagliflozin propanediol (FARXIGA) 10 MG TABS tablet Take 1 tablet (10 mg total) by mouth daily. 30 tablet 1   ezetimibe (ZETIA) 10 MG tablet Take 1 tablet (10 mg total) by mouth daily. 90 tablet 3   Multiple Vitamin (MULTIVITAMIN WITH MINERALS) TABS tablet Take 1 tablet by mouth daily.     tamsulosin (FLOMAX) 0.4 MG CAPS capsule Take 1 capsule (0.4 mg total) by mouth daily after breakfast. 30 capsule 1   furosemide (LASIX) 20 MG tablet Take 1 tablet (20 mg total) by mouth daily. (Patient not taking: Reported on 08/20/2022) 90 tablet 3   metoprolol succinate (TOPROL-XL) 25 MG 24 hr tablet Take 0.5 tablets (12.5 mg total) by mouth daily. 30 tablet 1   No current facility-administered medications for this visit.    Physical Exam BP 109/64   Pulse 72   Resp 20   Ht 5\' 9"  (1.753 m)   Wt 150 lb (68 kg)   SpO2 93% Comment: RA  BMI 22.20 kg/m  77 year old man in no acute distress Alert and oriented x 3 with no focal deficits Lungs clear with equal breath  sounds bilaterally Cardiac regular rate and rhythm normal S1 and S2 Sternum stable, incision clean dry and intact No peripheral edema  Diagnostic Tests: I personally reviewed his chest x-ray.  There is been near complete resolution of the left pleural effusion only a tiny effusion remains.  Impression: Hermilo "Cindee Lame" Bleyer is a 77 year old gentleman with a history of atrial fibrillation, hyperlipidemia, coronary calcification, kidney stones, congestive heart failure, and three-vessel coronary disease.  He underwent an ablation for atrial fibrillation and then presented back the following day with chest pain.  His troponins were elevated although that may have been from the ablation itself, but catheterization revealed severe  three-vessel disease.  He underwent coronary bypass grafting on 07/24/2022.  CAD-status post CABG.  No anginal type chest pain.  Exercise tolerance is improving.  Has noticed significant improvement over the past several days.  He is on aspirin and Eliquis.  Atrial fibrillation-status post ablation.  He is in a regular rhythm today.  He is not aware of any atrial fibrillation since discharge.  He is on amiodarone and Eliquis.  He is hoping to stop some of those medications in the near future.  I will defer that to Dr. Sherlie Ban who is seeing in July.  I suspect he will be on the Eliquis for quite some time.  He is now about a month out from surgery.  I advised him not to lift anything over 10 pounds for another 2 weeks and nothing over 20 pounds for another 4 weeks.  He may gradually begin resuming his activities.  He is going to do cardiac rehabilitation in [place] as he has a house at [place].  I advised him that he may begin driving.  Appropriate precautions were discussed.  Should only drive on a limited basis for the next several weeks as he is recovering.  Postoperative left pleural effusion-near complete resolution.  Does not need specific follow-up but I will see him back in 6 to 8 weeks and get another chest x-ray at that time.  Plan: Follow-up with Dr. Shirlee Latch as scheduled Return in 6 to 8 weeks as schedule allows.  [name], MD [facility] [phone]

## 2022-08-26 ENCOUNTER — Other Ambulatory Visit (HOSPITAL_COMMUNITY): Payer: Self-pay | Admitting: Cardiology

## 2022-08-26 MED ORDER — APIXABAN 5 MG PO TABS: 5.0000 mg | ORAL_TABLET | Freq: Two times a day (BID) | ORAL | 0 refills | Status: DC

## 2022-08-26 MED ORDER — FUROSEMIDE 20 MG PO TABS: 20.0000 mg | ORAL_TABLET | Freq: Every day | ORAL | 0 refills | Status: DC

## 2022-08-26 MED ORDER — TAMSULOSIN HCL 0.4 MG PO CAPS: 0.4000 mg | ORAL_CAPSULE | Freq: Every day | ORAL | 0 refills | Status: DC

## 2022-08-26 NOTE — Telephone Encounter (Signed)
Pt called to request summer refills to [facility] in [place] Lasix, eliquis and courtesy refill of flomax

## 2022-08-28 DIAGNOSIS — Z951 Presence of aortocoronary bypass graft: Secondary | ICD-10-CM | POA: Diagnosis not present

## 2022-08-30 DIAGNOSIS — Z951 Presence of aortocoronary bypass graft: Secondary | ICD-10-CM | POA: Diagnosis not present

## 2022-09-02 DIAGNOSIS — Z951 Presence of aortocoronary bypass graft: Secondary | ICD-10-CM | POA: Diagnosis not present

## 2022-09-04 DIAGNOSIS — Z951 Presence of aortocoronary bypass graft: Secondary | ICD-10-CM | POA: Diagnosis not present

## 2022-09-06 DIAGNOSIS — Z951 Presence of aortocoronary bypass graft: Secondary | ICD-10-CM | POA: Diagnosis not present

## 2022-09-09 DIAGNOSIS — Z951 Presence of aortocoronary bypass graft: Secondary | ICD-10-CM | POA: Diagnosis not present

## 2022-09-11 DIAGNOSIS — Z951 Presence of aortocoronary bypass graft: Secondary | ICD-10-CM | POA: Diagnosis not present

## 2022-09-13 DIAGNOSIS — Z951 Presence of aortocoronary bypass graft: Secondary | ICD-10-CM | POA: Diagnosis not present

## 2022-09-16 DIAGNOSIS — Z951 Presence of aortocoronary bypass graft: Secondary | ICD-10-CM | POA: Diagnosis not present

## 2022-09-18 DIAGNOSIS — Z951 Presence of aortocoronary bypass graft: Secondary | ICD-10-CM | POA: Diagnosis not present

## 2022-09-20 DIAGNOSIS — Z951 Presence of aortocoronary bypass graft: Secondary | ICD-10-CM | POA: Diagnosis not present

## 2022-09-23 DIAGNOSIS — Z951 Presence of aortocoronary bypass graft: Secondary | ICD-10-CM | POA: Diagnosis not present

## 2022-09-24 ENCOUNTER — Ambulatory Visit (HOSPITAL_COMMUNITY): Admission: RE | Admit: 2022-09-24 | Payer: Medicare Other | Source: Ambulatory Visit

## 2022-09-24 ENCOUNTER — Encounter (HOSPITAL_COMMUNITY): Payer: Self-pay | Admitting: Cardiology

## 2022-09-24 ENCOUNTER — Ambulatory Visit (HOSPITAL_COMMUNITY)
Admission: RE | Admit: 2022-09-24 | Discharge: 2022-09-24 | Disposition: A | Discharge status: Home / Self Care | Payer: Medicare Other | Source: Ambulatory Visit | Attending: Cardiology | Admitting: Cardiology

## 2022-09-24 VITALS — BP 100/58 | HR 65 | Wt 155.2 lb

## 2022-09-24 DIAGNOSIS — I5032 Chronic diastolic (congestive) heart failure: Secondary | ICD-10-CM | POA: Insufficient documentation

## 2022-09-24 DIAGNOSIS — Z951 Presence of aortocoronary bypass graft: Secondary | ICD-10-CM | POA: Insufficient documentation

## 2022-09-24 DIAGNOSIS — I2510 Atherosclerotic heart disease of native coronary artery without angina pectoris: Secondary | ICD-10-CM | POA: Diagnosis not present

## 2022-09-24 DIAGNOSIS — Z7901 Long term (current) use of anticoagulants: Secondary | ICD-10-CM | POA: Diagnosis not present

## 2022-09-24 DIAGNOSIS — I4819 Other persistent atrial fibrillation: Secondary | ICD-10-CM

## 2022-09-24 DIAGNOSIS — I509 Heart failure, unspecified: Secondary | ICD-10-CM

## 2022-09-24 DIAGNOSIS — Z79899 Other long term (current) drug therapy: Secondary | ICD-10-CM | POA: Diagnosis not present

## 2022-09-24 DIAGNOSIS — E785 Hyperlipidemia, unspecified: Secondary | ICD-10-CM | POA: Insufficient documentation

## 2022-09-24 DIAGNOSIS — N183 Chronic kidney disease, stage 3 unspecified: Secondary | ICD-10-CM | POA: Insufficient documentation

## 2022-09-24 DIAGNOSIS — Z7984 Long term (current) use of oral hypoglycemic drugs: Secondary | ICD-10-CM | POA: Diagnosis not present

## 2022-09-24 DIAGNOSIS — I4892 Unspecified atrial flutter: Secondary | ICD-10-CM | POA: Diagnosis not present

## 2022-09-24 DIAGNOSIS — I4891 Unspecified atrial fibrillation: Secondary | ICD-10-CM | POA: Diagnosis not present

## 2022-09-24 DIAGNOSIS — Z7982 Long term (current) use of aspirin: Secondary | ICD-10-CM | POA: Diagnosis not present

## 2022-09-24 LAB — BASIC METABOLIC PANEL
Anion gap: 10 (ref 5–15)
BUN: 28 mg/dL — ABNORMAL HIGH (ref 8–23)
CO2: 26 mmol/L (ref 22–32)
Calcium: 9 mg/dL (ref 8.9–10.3)
Chloride: 106 mmol/L (ref 98–111)
Creatinine, Ser: 1.61 mg/dL — ABNORMAL HIGH (ref 0.61–1.24)
GFR, Estimated: 44 mL/min — ABNORMAL LOW (ref >60)
Glucose, Bld: 85 mg/dL (ref 70–99)
Potassium: 3.9 mmol/L (ref 3.5–5.1)
Sodium: 142 mmol/L (ref 135–145)

## 2022-09-24 LAB — BRAIN NATRIURETIC PEPTIDE: B Natriuretic Peptide: 201.4 pg/mL — ABNORMAL HIGH (ref 0.0–100.0)

## 2022-09-24 LAB — ECHOCARDIOGRAM COMPLETE
Area-P 1/2: 2.83 cm2
Calc EF: 57.6 %
S' Lateral: 3.6 cm
Single Plane A2C EF: 56.6 %
Single Plane A4C EF: 59.8 %

## 2022-09-24 LAB — TSH: TSH: 1.335 u[IU]/mL (ref 0.350–4.500)

## 2022-09-24 LAB — T4, FREE: Free T4: 0.97 ng/dL (ref 0.61–1.12)

## 2022-09-24 NOTE — Progress Notes (Signed)
Echocardiogram 2D Echocardiogram has been performed.  Walter George 09/24/2022, 2:41 PM

## 2022-09-24 NOTE — Patient Instructions (Signed)
STOP Lasix.  Labs done today, your results will be available in MyChart, we will contact you for abnormal readings.  Your physician recommends that you schedule a follow-up appointment in: 3 months ( October) ** Please call the office in [facility] August to arrange your follow up appointment. **  If you have any questions or concerns before your next appointment please send [place] a message through [place] or call our office at [phone].    TO LEAVE A MESSAGE FOR THE NURSE SELECT OPTION 2, PLEASE LEAVE A MESSAGE INCLUDING: YOUR NAME DATE OF BIRTH CALL BACK NUMBER REASON FOR CALL**this is important as we prioritize the call backs  YOU WILL RECEIVE A CALL BACK THE SAME DAY AS LONG AS YOU CALL BEFORE 4:00 PM  At the [facility], you and your health needs are our priority. As part of our continuing mission to provide you with exceptional heart care, we have created designated Provider Care Teams. These Care Teams include your primary Cardiologist (physician) and Advanced Practice Providers (APPs- Physician Assistants and Nurse Practitioners) who all work together to provide you with the care you need, when you need it.   You may see any of the following providers on your designated Care Team at your next follow up: Dr Arvilla Meres Dr Marca Ancona Dr. Marcos Eke, NP Robbie Lis, [place] [facility] [place], [place] Brynda Peon, NP Karle Plumber, PharmD   Please be sure to bring in all your medications bottles to every appointment.    Thank you for choosing [facility]

## 2022-09-25 ENCOUNTER — Other Ambulatory Visit (HOSPITAL_COMMUNITY): Payer: Self-pay | Admitting: Cardiology

## 2022-09-25 DIAGNOSIS — Z951 Presence of aortocoronary bypass graft: Secondary | ICD-10-CM | POA: Diagnosis not present

## 2022-09-25 LAB — HIGH SENSITIVITY CRP: CRP, High Sensitivity: 0.49 mg/L (ref 0.00–3.00)

## 2022-09-25 NOTE — Progress Notes (Signed)
ID:  Walter George, DOB 02-07-1946, MRN [id number]   Provider location: [place] [facility] Type of Visit: Established patient   PCP:  Rodrigo Ran, MD  Cardiologist:  Dr. Shirlee Latch   History of Present Illness: Walter George is a 77 y.o. with hyperlipidemia and abnormal calcium score scan who presents for followup of CAD and atrial flutter.  He had a screening coronary calcium score done in 10/20.  This showed 1654 Agatston units, placing him in the 91st percentile for his age and gender.  He is now on atorvastatin. Cardiolite in 10/20 showed no ischemia or infarction.  Of note, his younger brother died suddenly, autopsy showed he had a myocardial infarction.   He saw his PCP in 11/23, HR was irregular on exam and ECG showed atrial fibrillation or flutter.  He was sent to atrial fibrillation clinic, with ECG showing atrial flutter.  He went into atrial fibrillation and had DCCV in 1/24.  He was then seen by Dr. Elberta Fortis with plan for atrial fibrillation ablation. Of note, the pre-ablation CTA showed a significantly increased calcium score of 2599.   Patient had atrial fibrillation ablation on 5/3, the next day he noted pleuritic chest pain.  He went to the ER, and HS-TnI was elevated but without trend.  He ended up getting a heart catheterization showing severe chronic-appearing 3 vessel disease without a culprit lesion. Pleuritic chest pain and mild troponin elevation thought to be due to the atrial fibrillation ablation.  He was admitted and had CABG x 5 with LIMA-LAD, SVG-dRCA, SVG-D, sequential SVG-ramus and OM, LA appendage was clipped.  Pre-op echo showed EF 55-60% with normal RV.  Post-op course complicated by recurrent atrial fibrillation that resolved with amiodarone.  He had post-op hypotension requiring midodrine that was titrated off and post-op volume overload.   Echo was done today and reviewed, EF 55-60%, normal diastolic function, normal RV, trivial MR,  IVC normal.   He returns today for followup of CAD.  He has now been off amiodarone for about a month and remains in NSR.  No palpitations.  He is doing 30-40 minutes of cardiovascular exercise daily.  He walks 2-3 miles at a time and is still doing cardiac rehab in [place].  Weight is down 8 lbs. No chest pain.  No exertional dyspnea, orthopnea/PND.    Labs (9/20): LDL 93, HDL 71, K 4.8, creatinine 1.3 Labs (11/20): K 4.8, creatinine 1.13 Labs (12/20): LDL 63, HDL 63 Labs (9/21): LDL 61, HDL 71, TGs 66 Labs (3/22): LDL 43, HDL 59 Labs (5/24): LDL 36, Lp(a) 16.1, K 5.4, creatinine 1.44, BNP 519, TSH 5.18, LDL particle number 393  ECG (personally reviewed): NSR, nonspecific T wave changes.   PMH: 1. Nephrolithiasis 2. Hyperlipidemia 3. CAD: Coronary calcium score scan in 10/20 with 1654 Agatston units, placing the patient in the 91st percentile for age/gender. - Echo (11/18): EF 55-60%, mild LVH.   - ETT-Cardiolite (10/20) with 16 METS, no significant ST changes, EF 61%, no ischemia/infarction.  - Cath 5/24 with severe 3 vessel disease, had CABG x 5 with LIMA-LAD, SVG-dRCA, SVG-D, sequential SVG-ramus and OM 4. Atrial flutter/fibrillation: Diagnosed in 11/23 - DCCV 1/24 - Atrial fibrillation ablation 5/24 - LA appendage clipping with CABG 5/24 5. Chronic diastolic CHF: Echo (5/24) with EF 55-60%, normal RV.  - Echo (7/24): EF 55-60%, normal diastolic function, normal RV, trivial MR, IVC normal.  6. CKD stage 3  SH: Married, retired Psychologist, occupational  living in [place].  Occasional ETOH, never smoker.   FH: Father died with lymphoma, mother had dementia and died at 63.  Younger brother with SCD from MI.   ROS: All systems reviewed and negative except as per HPI.   Current Outpatient Medications  Medication Sig Dispense Refill   acetaminophen (TYLENOL) 500 MG tablet Take 1,000 mg by mouth every 6 (six) hours as needed for headache or moderate pain.     apixaban (ELIQUIS) 5 MG TABS tablet  Take 1 tablet (5 mg total) by mouth 2 (two) times daily. 180 tablet 0   aspirin EC 81 MG tablet Take 1 tablet (81 mg total) by mouth daily. Swallow whole. 30 tablet 12   atorvastatin (LIPITOR) 80 MG tablet Take 1 tablet (80 mg total) by mouth daily. 90 tablet 3   dapagliflozin propanediol (FARXIGA) 10 MG TABS tablet Take 1 tablet (10 mg total) by mouth daily. 30 tablet 1   ezetimibe (ZETIA) 10 MG tablet Take 1 tablet (10 mg total) by mouth daily. 90 tablet 3   Multiple Vitamin (MULTIVITAMIN WITH MINERALS) TABS tablet Take 1 tablet by mouth daily.     tamsulosin (FLOMAX) 0.4 MG CAPS capsule Take 1 capsule (0.4 mg total) by mouth daily after breakfast. 30 capsule 0   No current facility-administered medications for this encounter.   BP (!) 100/58   Pulse 65   Wt 70.4 kg (155 lb 3.2 oz)   SpO2 97%   BMI 22.92 kg/m  General: NAD Neck: No JVD, no thyromegaly or thyroid nodule.  Lungs: Clear to auscultation bilaterally with normal respiratory effort. CV: Nondisplaced PMI.  Heart regular S1/S2, no S3/S4, no murmur.  No peripheral edema.  No carotid bruit.  Normal pedal pulses.  Abdomen: Soft, nontender, no hepatosplenomegaly, no distention.  Skin: Intact without lesions or rashes.  Neurologic: Alert and oriented x 3.  Psych: Normal affect. Extremities: No clubbing or cyanosis.  HEENT: Normal.   Assessment/Plan: 1. CAD: S/p CABG in 5/24. Patient was admitted with pleuritic chest pain that was likely due to AF ablation the prior day, but had cath with 3VD (chronic lesions) resulting in CABG.  Doing well post-op, no chest pain.  - Continue ASA 81 for a year or so post-CABG, then stop as he will be on Eliquis.  - BP is still a bit low, will wait until next appointment to restart ramipril (will stop Lasix today).  - Continue atorvastatin 80 mg daily and Zetia 10 mg daily, excellent lipids in 5/24 and low Lp(a) as well as LDL particle number.  - I will order HS-CRP today.  He may be a candidate  for colchicine therapy if CRP elevated.   - Continue cardiac rehab.  - Continue Farxiga 10 mg daily.  - Given CAD progression despite aggressive lipid-lowering in the past, we discussed periodic screening cardiac PET scans.  2. Hyperlipidemia: Goal LDL < 55. Lp(a) and LDL particle number not elevated.  - Continue atorvastatin and Zetia. 3. Atrial fibrillation/flutter: s/p AF ablation in 5/24 and LA appendage clipping with CABG. He is now off amiodarone and maintaining NSR.  - Continue Eliquis 4. Chronic diastolic CHF: Volume overload post-CABG, was diuresed in hospital.  Echo today showed EF 55-60%, normal diastolic function, normal RV, trivial MR, IVC normal.  - Continue Farxiga 10 mg daily.  - Stop Lasix.  5. CKD stage 3: Continue Farxiga. Stop Lasix.   Followup in 2 months.    Signed, Walter Ancona, MD  09/25/2022  [facility] [address] [facility] [place] [address] [phone] (office) [phone] (fax)

## 2022-09-25 NOTE — Telephone Encounter (Signed)
Pt aware PCP or urologist to take over RX

## 2022-09-26 ENCOUNTER — Telehealth (HOSPITAL_COMMUNITY): Payer: Self-pay

## 2022-09-26 ENCOUNTER — Other Ambulatory Visit (HOSPITAL_COMMUNITY): Payer: Self-pay

## 2022-09-26 ENCOUNTER — Other Ambulatory Visit (HOSPITAL_COMMUNITY): Payer: Self-pay | Admitting: Cardiology

## 2022-09-26 MED ORDER — DAPAGLIFLOZIN PROPANEDIOL 10 MG PO TABS: 10.0000 mg | ORAL_TABLET | Freq: Every day | ORAL | 11 refills | Status: DC

## 2022-09-26 MED ORDER — COLCHICINE 0.6 MG PO TABS: 0.6000 mg | ORAL_TABLET | Freq: Every day | ORAL | 11 refills | Status: DC

## 2022-09-26 MED ORDER — DAPAGLIFLOZIN PROPANEDIOL 10 MG PO TABS: 10.0000 mg | ORAL_TABLET | Freq: Every day | ORAL | 0 refills | Status: DC

## 2022-09-26 NOTE — Telephone Encounter (Addendum)
Pt aware, agreeable, and verbalized understanding Rx Sent   ----- Message from Marca Ancona sent at 09/26/2022  3:36 PM EDT ----- Let's have him start colchicine 0.6 to decrease risk for future coronary events. Let him know that there is a small risk of diarrhea/GI upset and to let us know if he has any problems with it. ----- Message ----- From: Evon Slack, RPH-CPP Sent: 09/26/2022  12:35 PM EDT To: [name], MD  The Lodoco 0.5 mg tablet is nonformulary on his insurance. It is also only currently available through one mail order pharmacy ([facility]). However, colchicine 0.6 mg tablet is $8.47/30 days. I would recommend starting the colchicine 0.6 mg daily since that will be more convenient and affordable for the patient. If that is ok with you, I can get the prescription sent to his local pharmacy. If you still wish to proceed with the 0.5 mg tablet, I can try and submit the PA, but I think he will have to demonstrate that he failed the generic tablets and will likely be cost prohibitive even if it is approved (for reference, the generic colchicine capsules are also nonformulary and are $173/month, as opposed to <$10 for the tablets.) ----- Message ----- From: [name], MD Sent: 09/25/2022   9:47 PM EDT To: Evon Slack, RPH-CPP; [facility] Triage Pool  HS-CRP is not signficantly elevated.  However, given quite rapid progression of atherosclerosis, he may derive some benefit from low dose colchicine long-term.  Lauren: is it possible to get colchicine 0.5 mg daily for this patient (using regimen for the Lodoco trial)?

## 2022-09-27 DIAGNOSIS — Z951 Presence of aortocoronary bypass graft: Secondary | ICD-10-CM | POA: Diagnosis not present

## 2022-09-30 DIAGNOSIS — Z951 Presence of aortocoronary bypass graft: Secondary | ICD-10-CM | POA: Diagnosis not present

## 2022-10-02 DIAGNOSIS — Z951 Presence of aortocoronary bypass graft: Secondary | ICD-10-CM | POA: Diagnosis not present

## 2022-10-04 DIAGNOSIS — Z951 Presence of aortocoronary bypass graft: Secondary | ICD-10-CM | POA: Diagnosis not present

## 2022-10-07 ENCOUNTER — Telehealth (HOSPITAL_COMMUNITY): Payer: Self-pay | Admitting: Cardiology

## 2022-10-07 DIAGNOSIS — Z951 Presence of aortocoronary bypass graft: Secondary | ICD-10-CM | POA: Diagnosis not present

## 2022-10-07 NOTE — Telephone Encounter (Signed)
Pt scheduled for a 14.5 hr flight, with recent bypass surgery on 5/8 wanted to know if compression stockings are recommended.    Advised pt to also contact surgeons office. Pt greatly appreciates Dr Kathlyn Sacramento input

## 2022-10-07 NOTE — Telephone Encounter (Signed)
Yes, would wear the stockings & get up periodically to walk

## 2022-10-07 NOTE — Telephone Encounter (Signed)
Pt aware.

## 2022-10-21 ENCOUNTER — Other Ambulatory Visit: Payer: Self-pay | Admitting: Thoracic Surgery (Cardiothoracic Vascular Surgery)

## 2022-10-21 DIAGNOSIS — Z951 Presence of aortocoronary bypass graft: Secondary | ICD-10-CM

## 2022-10-22 ENCOUNTER — Ambulatory Visit: Payer: Medicare Other | Admitting: Cardiology

## 2022-10-22 ENCOUNTER — Ambulatory Visit (INDEPENDENT_AMBULATORY_CARE_PROVIDER_SITE_OTHER): Payer: Self-pay | Admitting: Thoracic Surgery (Cardiothoracic Vascular Surgery)

## 2022-10-22 ENCOUNTER — Ambulatory Visit
Admission: RE | Admit: 2022-10-22 | Discharge: 2022-10-22 | Disposition: A | Discharge status: Home / Self Care | Payer: Medicare Other | Source: Ambulatory Visit | Attending: Thoracic Surgery (Cardiothoracic Vascular Surgery) | Admitting: Thoracic Surgery (Cardiothoracic Vascular Surgery)

## 2022-10-22 ENCOUNTER — Encounter: Payer: Self-pay | Admitting: Thoracic Surgery (Cardiothoracic Vascular Surgery)

## 2022-10-22 ENCOUNTER — Encounter: Payer: Self-pay | Admitting: Cardiology

## 2022-10-22 VITALS — BP 126/73 | HR 60 | Resp 20 | Ht 69.0 in | Wt 153.4 lb

## 2022-10-22 VITALS — BP 126/80 | HR 59 | Ht 69.0 in | Wt 155.0 lb

## 2022-10-22 DIAGNOSIS — D6869 Other thrombophilia: Secondary | ICD-10-CM | POA: Diagnosis not present

## 2022-10-22 DIAGNOSIS — Z951 Presence of aortocoronary bypass graft: Secondary | ICD-10-CM | POA: Diagnosis not present

## 2022-10-22 DIAGNOSIS — I4819 Other persistent atrial fibrillation: Secondary | ICD-10-CM | POA: Diagnosis not present

## 2022-10-22 DIAGNOSIS — I2510 Atherosclerotic heart disease of native coronary artery without angina pectoris: Secondary | ICD-10-CM

## 2022-10-22 DIAGNOSIS — I483 Typical atrial flutter: Secondary | ICD-10-CM

## 2022-10-22 NOTE — Progress Notes (Signed)
Electrophysiology Office Note:   Date:  10/22/2022  ID:  Walter George, DOB 07-12-1945, MRN [id number]  Primary Cardiologist: None Electrophysiologist:  Jorja Loa, MD      History of Present Illness:   Walter George is a 77 y.o. male with h/o hypertension, hyperlipidemia, coronary artery disease, atrial fibrillation seen today for routine electrophysiology followup.  Since last being seen in our clinic the patient reports doing well.  Since his ablation and CABG, he has been able to do his daily activities.  He is now able to exercise.  He was minimally symptomatic prior to his CABG.  He he feels good having the peace of mind that he has now had surgery.  he denies chest pain, palpitations, dyspnea, PND, orthopnea, nausea, vomiting, dizziness, syncope, edema, weight gain, or early satiety.      He has a history for hyperlipidemia, elevated coronary artery calcium score of 1654 with a nonischemic Myoview, atrial fibrillation. He was seen by his PCP November 2023 with atrial fibrillation. He has now status post cardioversion 03/26/2022. He has dyspnea when walking up a hill, but this is unchanged from previous. He plays golf and tennis weekly and walks for exercise.  He presented to the hospital for A-fib ablation 07/19/2022.  He developed chest pain post ablation and had left heart catheterization showing severe coronary artery disease.  He is post CABG x 5.      Review of systems complete and found to be negative unless listed in HPI.   EP Information / Studies Reviewed:    EKG is ordered today. Personal review as below.  EKG Interpretation Date/Time:  Tuesday October 22 2022 14:35:43 EDT Ventricular Rate:  59 PR Interval:  152 QRS Duration:  84 QT Interval:  440 QTC Calculation: 435 R Axis:   73  Text Interpretation: Sinus bradycardia with occasional Premature ventricular complexes When compared with ECG of 24-Sep-2022 15:20, Premature ventricular complexes are now  Present Confirmed by ,  ([id number]) on 10/22/2022 2:46:12 PM     Risk Assessment/Calculations:    CHA2DS2-VASc Score = 4   This indicates a 4.8% annual risk of stroke. The patient's score is based upon: CHF History: 0 HTN History: 1 Diabetes History: 0 Stroke History: 0 Vascular Disease History: 1 Age Score: 2 Gender Score: 0             Physical Exam:   VS:  BP 126/80 (BP Location: Left Arm, Patient Position: Sitting, Cuff Size: Normal)   Pulse (!) 59   Ht 5\' 9"  (1.753 m)   Wt 155 lb (70.3 kg)   SpO2 98%   BMI 22.89 kg/m    Wt Readings from Last 3 Encounters:  10/22/22 155 lb (70.3 kg)  10/22/22 153 lb 6.4 oz (69.6 kg)  09/24/22 155 lb 3.2 oz (70.4 kg)     GEN: Well nourished, well developed in no acute distress NECK: No JVD; No carotid bruits CARDIAC: Regular rate and rhythm, no murmurs, rubs, gallops RESPIRATORY:  Clear to auscultation without rales, wheezing or rhonchi  ABDOMEN: Soft, non-tender, non-distended EXTREMITIES:  No edema; No deformity   ASSESSMENT AND PLAN:    1.  Persistent atrial fibrillation/flutter: Status post ablation 07/19/2022.  Remains in sinus rhythm.  Continue Eliquis.  No further episodes of atrial fibrillation.  2.  Coronary artery disease: Status post CABG x 5.  Continue plan per primary cardiology.  3.  Secondary hypercoagulable state: Currently on Eliquis for atrial fibrillation  4.  Hyperlipidemia: Continue atorvastatin  and Zetia per primary cardiology  Follow up with Dr. Elberta Fortis in 6 months  Signed,  Jorja Loa, MD

## 2022-10-22 NOTE — Progress Notes (Signed)
[address]       [place] [address]             [phone]     HPI: Walter George returns for a scheduled follow-up visit  Walter George is a 77 year old gentleman with a history of atrial fibrillation, hyperlipidemia, coronary calcification, kidney stones, and congestive heart failure.  He underwent an ablation for his atrial fibrillation on 07/19/2022.  He came back the following evening with substernal chest pain.  Had elevated troponins and catheterization revealed severe three-vessel disease.  I did CABG x 5 and left atrial appendage clip on 07/24/2022.  Postoperatively had some issues with urinary retention and also had atrial fibrillation with RVR.  He also had some hypotension requiring midodrine but that was stopped prior to discharge.  Ultimately discharged home on postoperative day #8.  I saw him back on 08/06/2022 and he was doing well.  He did have some mild fluid overload with peripheral edema and a small left effusion.  He was treated with Lasix and a prednisone taper.  I saw him back about 2 weeks later and he was doing better.  In the interim since his last visit he traveled to [place] with his family.  He did some hiking and tolerated that well without any recurrent angina.  He did get tired more easily than he had prior to surgery.  Patient Active Problem List   Diagnosis Date Noted   S/P CABG x 5 07/24/2022   Unstable angina (HCC) 07/21/2022   Retinal tear, left 05/22/2021   Retinal tear, right 05/22/2021   Degenerative tear of medial meniscus of left knee 04/26/2014   Plantar fasciitis 02/27/2011   Routine health maintenance 02/27/2011   NEPHROLITHIASIS, HX OF 04/14/2007     Current Outpatient Medications  Medication Sig Dispense Refill   acetaminophen (TYLENOL) 500 MG tablet Take 1,000 mg by mouth every 6 (six) hours as needed for headache or moderate pain.     apixaban (ELIQUIS) 5 MG TABS tablet Take 1 tablet (5 mg total) by  mouth 2 (two) times daily. 180 tablet 0   aspirin EC 81 MG tablet Take 1 tablet (81 mg total) by mouth daily. Swallow whole. 30 tablet 12   atorvastatin (LIPITOR) 80 MG tablet Take 1 tablet (80 mg total) by mouth daily. 90 tablet 3   colchicine 0.6 MG tablet Take 1 tablet (0.6 mg total) by mouth daily. 30 tablet 11   dapagliflozin propanediol (FARXIGA) 10 MG TABS tablet Take 1 tablet (10 mg total) by mouth daily. 30 tablet 11   ezetimibe (ZETIA) 10 MG tablet Take 1 tablet (10 mg total) by mouth daily. 90 tablet 3   Multiple Vitamin (MULTIVITAMIN WITH MINERALS) TABS tablet Take 1 tablet by mouth daily.     tamsulosin (FLOMAX) 0.4 MG CAPS capsule TAKE 1 CAPSULE(0.4 MG) BY MOUTH DAILY AFTER BREAKFAST 30 capsule 0   No current facility-administered medications for this visit.    Physical Exam BP 126/73 (BP Location: Left Arm, Patient Position: Sitting, Cuff Size: Normal)   Pulse 60   Resp 20   Ht 5\' 9"  (1.753 m)   Wt 153 lb 6.4 oz (69.6 kg)   SpO2 99% Comment: RA  BMI 22.65 kg/m  Well-appearing 77 year old man in no acute distress Alert and oriented x 3 with no focal deficits Lungs clear with equal breath sounds bilaterally Cardiac regular rate and rhythm Sternum stable, incision well-healed No peripheral edema  Diagnostic Tests:  Personally reviewed his chest x-ray images.  There are postoperative changes, no effusions or infiltrates.  Impression: Walter George is a 77 year old gentleman with a history of atrial fibrillation, hyperlipidemia, coronary calcification, kidney stones, congestive heart failure, three-vessel coronary disease and coronary bypass grafting.    Three-vessel coronary disease-status post CABG x 5.  No recurrent angina.  Status post CABG x 5 left atrial appendage clip-doing well from a surgical standpoint.  Minimal discomfort.  Exercise tolerance is good and continues to improve.  No restrictions on his activities from my standpoint.  Atrial  fibrillation -he underwent an ablation for his atrial fibrillation on 07/19/2022.  He had some atrial fibrillation in the postoperative period.  He is not aware of any since then.   Plan: Continue to follow-up with cardiology I will be happy to see Mr. Meloni back anytime in the future if I can be of any assistance with his care  [name], MD [facility] [phone]

## 2022-10-22 NOTE — Patient Instructions (Signed)
Medication Instructions:  Your physician recommends that you continue on your current medications as directed. Please refer to the Current Medication list given to you today.  *If you need a refill on your cardiac medications before your next appointment, please call your pharmacy*   Lab Work: None ordered  Testing/Procedures: None ordered   Follow-Up: At [facility], you and your health needs are our priority.  As part of our continuing mission to provide you with exceptional heart care, we have created designated Provider Care Teams.  These Care Teams include your primary Cardiologist (physician) and Advanced Practice Providers (APPs -  Physician Assistants and Nurse Practitioners) who all work together to provide you with the care you need, when you need it.  Your next appointment:   6 month(s)  The format for your next appointment:   In Person  Provider:   Will Camnitz, MD    Thank you for choosing [facility]!!    , RN [phone]   Other Instructions     

## 2022-10-25 ENCOUNTER — Telehealth: Payer: Self-pay | Admitting: Cardiology

## 2022-10-25 DIAGNOSIS — Z951 Presence of aortocoronary bypass graft: Secondary | ICD-10-CM | POA: Diagnosis not present

## 2022-10-25 NOTE — Telephone Encounter (Signed)
Returning nurses call. Just wants to make nurse aware that previous call was just an FYI, no order is needed. She states that she is sending over Telemetry strips. Please advise

## 2022-10-25 NOTE — Telephone Encounter (Signed)
Could not reach [place].  I informed the gentleman I spoke with that they will need to fax to Dr. Alford Highland office.  Informed that Dr. Elberta Fortis only performed an ablation which does not require rehab.  Informed that pt did have a CABG afterwards and [facility]'s office will follow that. Their office number given so they can obtain fax number. They appreciate the return call and advisement.

## 2022-10-25 NOTE — Telephone Encounter (Signed)
Marisue Ivan from [facility] in [place] was calling to inform us that she is faxing over some paper work to our office in regards to the patient.

## 2022-10-26 ENCOUNTER — Other Ambulatory Visit (HOSPITAL_COMMUNITY): Payer: Self-pay | Admitting: Cardiology

## 2022-10-28 DIAGNOSIS — Z951 Presence of aortocoronary bypass graft: Secondary | ICD-10-CM | POA: Diagnosis not present

## 2022-11-01 DIAGNOSIS — Z951 Presence of aortocoronary bypass graft: Secondary | ICD-10-CM | POA: Diagnosis not present

## 2022-11-04 DIAGNOSIS — Z951 Presence of aortocoronary bypass graft: Secondary | ICD-10-CM | POA: Diagnosis not present

## 2022-11-08 DIAGNOSIS — Z951 Presence of aortocoronary bypass graft: Secondary | ICD-10-CM | POA: Diagnosis not present

## 2022-11-11 DIAGNOSIS — Z951 Presence of aortocoronary bypass graft: Secondary | ICD-10-CM | POA: Diagnosis not present

## 2022-11-15 DIAGNOSIS — Z951 Presence of aortocoronary bypass graft: Secondary | ICD-10-CM | POA: Diagnosis not present

## 2022-11-19 ENCOUNTER — Other Ambulatory Visit: Payer: Self-pay | Admitting: *Deleted

## 2022-11-19 DIAGNOSIS — I4819 Other persistent atrial fibrillation: Secondary | ICD-10-CM

## 2022-11-19 MED ORDER — APIXABAN 5 MG PO TABS
5.0000 mg | ORAL_TABLET | Freq: Two times a day (BID) | ORAL | 1 refills | Status: DC
Start: 2022-11-19 — End: 2023-05-19

## 2022-11-19 NOTE — Telephone Encounter (Signed)
Eliquis 5mg  refill request received. Patient is 77 years old, weight-70.3kg, Crea- 1.61 on 09/24/22, Diagnosis-Afib, and last seen by Dr. Elberta Fortis on 10/22/22. Dose is appropriate based on dosing criteria. Will send in refill to requested pharmacy.

## 2022-12-18 ENCOUNTER — Encounter (INDEPENDENT_AMBULATORY_CARE_PROVIDER_SITE_OTHER): Payer: Medicare Other | Admitting: Ophthalmology

## 2023-01-06 ENCOUNTER — Ambulatory Visit (HOSPITAL_COMMUNITY)
Admission: RE | Admit: 2023-01-06 | Discharge: 2023-01-06 | Disposition: A | Discharge status: Home / Self Care | Payer: Medicare Other | Source: Ambulatory Visit | Attending: Cardiology | Admitting: Cardiology

## 2023-01-06 ENCOUNTER — Encounter (HOSPITAL_COMMUNITY): Payer: Self-pay | Admitting: Cardiology

## 2023-01-06 VITALS — BP 110/70 | HR 56 | Wt 162.0 lb

## 2023-01-06 DIAGNOSIS — Z8249 Family history of ischemic heart disease and other diseases of the circulatory system: Secondary | ICD-10-CM | POA: Insufficient documentation

## 2023-01-06 DIAGNOSIS — Z7984 Long term (current) use of oral hypoglycemic drugs: Secondary | ICD-10-CM | POA: Insufficient documentation

## 2023-01-06 DIAGNOSIS — Z951 Presence of aortocoronary bypass graft: Secondary | ICD-10-CM | POA: Insufficient documentation

## 2023-01-06 DIAGNOSIS — I4819 Other persistent atrial fibrillation: Secondary | ICD-10-CM | POA: Diagnosis not present

## 2023-01-06 DIAGNOSIS — I5032 Chronic diastolic (congestive) heart failure: Secondary | ICD-10-CM | POA: Insufficient documentation

## 2023-01-06 DIAGNOSIS — N183 Chronic kidney disease, stage 3 unspecified: Secondary | ICD-10-CM | POA: Diagnosis not present

## 2023-01-06 DIAGNOSIS — Z79899 Other long term (current) drug therapy: Secondary | ICD-10-CM | POA: Diagnosis not present

## 2023-01-06 DIAGNOSIS — Z7901 Long term (current) use of anticoagulants: Secondary | ICD-10-CM | POA: Insufficient documentation

## 2023-01-06 DIAGNOSIS — E785 Hyperlipidemia, unspecified: Secondary | ICD-10-CM | POA: Insufficient documentation

## 2023-01-06 DIAGNOSIS — I4891 Unspecified atrial fibrillation: Secondary | ICD-10-CM | POA: Insufficient documentation

## 2023-01-06 DIAGNOSIS — I4892 Unspecified atrial flutter: Secondary | ICD-10-CM | POA: Insufficient documentation

## 2023-01-06 DIAGNOSIS — I2510 Atherosclerotic heart disease of native coronary artery without angina pectoris: Secondary | ICD-10-CM | POA: Insufficient documentation

## 2023-01-06 LAB — CBC
HCT: 45.6 % (ref 39.0–52.0)
Hemoglobin: 15.3 g/dL (ref 13.0–17.0)
MCH: 29.9 pg (ref 26.0–34.0)
MCHC: 33.6 g/dL (ref 30.0–36.0)
MCV: 89.1 fL (ref 80.0–100.0)
Platelets: 245 10*3/uL (ref 150–400)
RBC: 5.12 MIL/uL (ref 4.22–5.81)
RDW: 13.5 % (ref 11.5–15.5)
WBC: 6.2 10*3/uL (ref 4.0–10.5)
nRBC: 0 % (ref 0.0–0.2)

## 2023-01-06 LAB — BASIC METABOLIC PANEL
Anion gap: 4 — ABNORMAL LOW (ref 5–15)
BUN: 24 mg/dL — ABNORMAL HIGH (ref 8–23)
CO2: 25 mmol/L (ref 22–32)
Calcium: 8.7 mg/dL — ABNORMAL LOW (ref 8.9–10.3)
Chloride: 110 mmol/L (ref 98–111)
Creatinine, Ser: 1.62 mg/dL — ABNORMAL HIGH (ref 0.61–1.24)
GFR, Estimated: 44 mL/min — ABNORMAL LOW (ref >60)
Glucose, Bld: 71 mg/dL (ref 70–99)
Potassium: 4.8 mmol/L (ref 3.5–5.1)
Sodium: 139 mmol/L (ref 135–145)

## 2023-01-06 LAB — LIPID PANEL
Cholesterol: 91 mg/dL (ref 0–200)
HDL: 48 mg/dL (ref >40)
LDL Cholesterol: 33 mg/dL (ref 0–99)
Total CHOL/HDL Ratio: 1.9 {ratio}
Triglycerides: 48 mg/dL (ref <150)
VLDL: 10 mg/dL (ref 0–40)

## 2023-01-06 NOTE — Patient Instructions (Signed)
STOP Asprin on 02/16/23  Labs done today, your results will be available in MyChart, we will contact you for abnormal readings.  Your physician recommends that you schedule a follow-up appointment in: 4 months ( February 2025) ** PLEASE CALL THE OFFICE IN NOVEMBER TO ARRANGE YOUR FOLLOW UP APPOINTMENT. **  If you have any questions or concerns before your next appointment please send [place] a message through [place] or call our office at [phone].    TO LEAVE A MESSAGE FOR THE NURSE SELECT OPTION 2, PLEASE LEAVE A MESSAGE INCLUDING: YOUR NAME DATE OF BIRTH CALL BACK NUMBER REASON FOR CALL**this is important as we prioritize the call backs  YOU WILL RECEIVE A CALL BACK THE SAME DAY AS LONG AS YOU CALL BEFORE 4:00 PM  At the [facility], you and your health needs are our priority. As part of our continuing mission to provide you with exceptional heart care, we have created designated Provider Care Teams. These Care Teams include your primary Cardiologist (physician) and Advanced Practice Providers (APPs- Physician Assistants and Nurse Practitioners) who all work together to provide you with the care you need, when you need it.   You may see any of the following providers on your designated Care Team at your next follow up: Dr Arvilla Meres Dr Marca Ancona Dr. Dorthula Nettles Dr. Clearnce Hasten Amy Filbert Schilder, NP Robbie Lis, [place] [facility] [place], [place] Brynda Peon, NP [place] Lee, NP Karle Plumber, PharmD   Please be sure to bring in all your medications bottles to every appointment.    Thank you for choosing [facility]

## 2023-01-06 NOTE — Progress Notes (Signed)
ID:  Walter George, DOB 01/18/1946, MRN [id number]   Provider location: [facility] Type of Visit: Established patient   PCP:  Rodrigo Ran, MD  Cardiologist:  Dr. Shirlee Latch   History of Present Illness: Walter George is a 77 y.o. with hyperlipidemia and abnormal calcium score scan who presents for followup of CAD and atrial flutter.  He had a screening coronary calcium score done in 10/20.  This showed 1654 Agatston units, placing him in the 91st percentile for his age and gender.  He is now on atorvastatin. Cardiolite in 10/20 showed no ischemia or infarction.  Of note, his younger brother died suddenly, autopsy showed he had a myocardial infarction.   He saw his PCP in 11/23, HR was irregular on exam and ECG showed atrial fibrillation or flutter.  He was sent to atrial fibrillation clinic, with ECG showing atrial flutter.  He went into atrial fibrillation and had DCCV in 1/24.  He was then seen by Dr. Elberta Fortis with plan for atrial fibrillation ablation. Of note, the pre-ablation CTA showed a significantly increased calcium score of 2599.   Patient had atrial fibrillation ablation on 5/3, the next day he noted pleuritic chest pain.  He went to the ER, and HS-TnI was elevated but without trend.  He ended up getting a heart catheterization showing severe chronic-appearing 3 vessel disease without a culprit lesion. Pleuritic chest pain and mild troponin elevation thought to be due to the atrial fibrillation ablation.  He was admitted and had CABG x 5 with LIMA-LAD, SVG-dRCA, SVG-D, sequential SVG-ramus and OM, LA appendage was clipped.  Pre-op echo showed EF 55-60% with normal RV.  Post-op course complicated by recurrent atrial fibrillation that resolved with amiodarone.  He had post-op hypotension requiring midodrine that was titrated off and post-op volume overload.   Echo in 7/24 showed EF 55-60%, normal diastolic function, normal RV, trivial MR, IVC normal.    He returns today for followup of CAD.  Weight up about 7 lbs.  He is now back to near his baseline (dropped weight peri-op CABG).  No chest pain or exertional dyspnea.  He is playing tennis regularly, walking 18 holes of golf.  Walks for exercise also.  No lightheadedness.  No palpitations, in NSR today.   Labs (9/20): LDL 93, HDL 71, K 4.8, creatinine 1.3 Labs (11/20): K 4.8, creatinine 1.13 Labs (12/20): LDL 63, HDL 63 Labs (9/21): LDL 61, HDL 71, TGs 66 Labs (3/22): LDL 43, HDL 59 Labs (5/24): LDL 36, Lp(a) 16.1, K 5.4, creatinine 1.44, BNP 519, TSH 5.18, LDL particle number 393 Labs (7/24): TSH normal, HS-CRP 0.49, BNP 201, K 3.9, creatinine 1.6  ECG (personally reviewed): NSR, nonspecific T wave flattening  PMH: 1. Nephrolithiasis 2. Hyperlipidemia 3. CAD: Coronary calcium score scan in 10/20 with 1654 Agatston units, placing the patient in the 91st percentile for age/gender. - Echo (11/18): EF 55-60%, mild LVH.   - ETT-Cardiolite (10/20) with 16 METS, no significant ST changes, EF 61%, no ischemia/infarction.  - Cath 5/24 with severe 3 vessel disease, had CABG x 5 with LIMA-LAD, SVG-dRCA, SVG-D, sequential SVG-ramus and OM 4. Atrial flutter/fibrillation: Diagnosed in 11/23 - DCCV 1/24 - Atrial fibrillation ablation 5/24 - LA appendage clipping with CABG 5/24 5. Chronic diastolic CHF: Echo (5/24) with EF 55-60%, normal RV.  - Echo (7/24): EF 55-60%, normal diastolic function, normal RV, trivial MR, IVC normal.  6. 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  Occasional ETOH, never smoker.   FH: Father died with lymphoma, mother had dementia and died at 56.  Younger brother with SCD from MI.   ROS: All systems reviewed and negative except as per HPI.   Current Outpatient Medications  Medication Sig Dispense Refill   acetaminophen (TYLENOL) 500 MG tablet Take 1,000 mg by mouth every 6 (six) hours as needed for headache or moderate pain.     apixaban  (ELIQUIS) 5 MG TABS tablet Take 1 tablet (5 mg total) by mouth 2 (two) times daily. 180 tablet 1   aspirin EC 81 MG tablet Take 1 tablet (81 mg total) by mouth daily. Swallow whole. 30 tablet 12   atorvastatin (LIPITOR) 80 MG tablet Take 1 tablet (80 mg total) by mouth daily. 90 tablet 3   colchicine 0.6 MG tablet Take 1 tablet (0.6 mg total) by mouth daily. 30 tablet 11   ezetimibe (ZETIA) 10 MG tablet Take 1 tablet (10 mg total) by mouth daily. 90 tablet 3   FARXIGA 10 MG TABS tablet TAKE 1 TABLET(10 MG) BY MOUTH DAILY 30 tablet 11   Multiple Vitamin (MULTIVITAMIN WITH MINERALS) TABS tablet Take 1 tablet by mouth daily.     No current facility-administered medications for this encounter.   BP 110/70   Pulse (!) 56   Wt 73.5 kg (162 lb)   SpO2 100%   BMI 23.92 kg/m  General: NAD Neck: No JVD, no thyromegaly or thyroid nodule.  Lungs: Clear to auscultation bilaterally with normal respiratory effort. CV: Nondisplaced PMI.  Heart regular S1/S2, no S3/S4, no murmur.  No peripheral edema.  No carotid bruit.  Normal pedal pulses.  Abdomen: Soft, nontender, no hepatosplenomegaly, no distention.  Skin: Intact without lesions or rashes.  Neurologic: Alert and oriented x 3.  Psych: Normal affect. Extremities: No clubbing or cyanosis.  HEENT: Normal.   Assessment/Plan: 1. CAD: S/p CABG in 5/24. Patient was admitted with pleuritic chest pain that was likely due to AF ablation the prior day, but had cath with 3VD (chronic lesions) resulting in CABG.  Doing well post-op, no exertional symptoms.  - He can stop ASA 81 at 6 months post-CABG in 12/24.  He will continue Eliquis long-term.   - We discussed restarting ramipril today for secondary prevention, he wants to avoid additional medications so will hold off for now.  - Continue atorvastatin 80 mg daily and Zetia 10 mg daily, excellent lipids in 5/24 and low Lp(a) as well as LDL particle number. Check lipids today.  - He is on colchicine for  secondary prevention in setting of extensive CAD.   - Continue Farxiga 10 mg daily.  2. Hyperlipidemia: Goal LDL < 55. Lp(a) and LDL particle number not elevated.  - Continue atorvastatin and Zetia, check lipids today. 3. Atrial fibrillation/flutter: s/p AF ablation in 5/24 and LA appendage clipping with CABG. He is now off amiodarone and maintaining NSR.  - Continue Eliquis, CBC today.  4. Chronic diastolic CHF: Volume overload post-CABG, was diuresed in hospital.  Echo in 7/24 showed EF 55-60%, normal diastolic function, normal RV, trivial MR, IVC normal.  - Continue Farxiga 10 mg daily.  5. CKD stage 3: Last creatinine 1.6.  - BMET today.  - Continue Farxiga.  - Would like to eventually get him back on ramipril  - Would consider future finerenone.   Followup in 4 months.    Signed, Marca Ancona, MD  01/06/2023  [facility]  [address] [facility] [place] [address] [phone] (office) [phone] (fax)

## 2023-01-24 ENCOUNTER — Encounter: Payer: Self-pay | Admitting: Gastroenterology

## 2023-02-09 ENCOUNTER — Other Ambulatory Visit: Payer: Self-pay | Admitting: Medical Genetics

## 2023-02-09 DIAGNOSIS — Z006 Encounter for examination for normal comparison and control in clinical research program: Secondary | ICD-10-CM

## 2023-02-12 ENCOUNTER — Other Ambulatory Visit (HOSPITAL_COMMUNITY)
Admission: RE | Admit: 2023-02-12 | Discharge: 2023-02-12 | Disposition: A | Discharge status: Home / Self Care | Payer: Medicare Other | Source: Ambulatory Visit | Attending: Oncology | Admitting: Oncology

## 2023-02-12 DIAGNOSIS — Z006 Encounter for examination for normal comparison and control in clinical research program: Secondary | ICD-10-CM | POA: Insufficient documentation

## 2023-02-25 LAB — GENECONNECT MOLECULAR SCREEN: Genetic Analysis Overall Interpretation: NEGATIVE

## 2023-02-27 ENCOUNTER — Ambulatory Visit: Payer: Medicare Other | Attending: Cardiovascular Disease | Admitting: *Deleted

## 2023-02-27 DIAGNOSIS — E781 Pure hyperglyceridemia: Secondary | ICD-10-CM | POA: Diagnosis not present

## 2023-02-27 DIAGNOSIS — E7801 Familial hypercholesterolemia: Secondary | ICD-10-CM | POA: Diagnosis not present

## 2023-02-27 DIAGNOSIS — E785 Hyperlipidemia, unspecified: Secondary | ICD-10-CM

## 2023-02-27 NOTE — Progress Notes (Signed)
Patient presents today for a genetic test at the request of Dr. Jearld Pies and Dr. Rennis Golden.  Genetic test for dyslipidemia/ASCVD panel ordered ([facility]) Cheek swab completed in office Specimen and necessary paperwork mailed. ID: [id number]

## 2023-03-15 DIAGNOSIS — U071 COVID-19: Secondary | ICD-10-CM | POA: Diagnosis not present

## 2023-03-24 ENCOUNTER — Other Ambulatory Visit (HOSPITAL_COMMUNITY): Payer: Self-pay

## 2023-03-24 ENCOUNTER — Other Ambulatory Visit: Payer: Self-pay

## 2023-03-24 DIAGNOSIS — I4891 Unspecified atrial fibrillation: Secondary | ICD-10-CM

## 2023-03-24 DIAGNOSIS — Z951 Presence of aortocoronary bypass graft: Secondary | ICD-10-CM

## 2023-03-24 MED ORDER — ATORVASTATIN CALCIUM 80 MG PO TABS: 80.0000 mg | ORAL_TABLET | Freq: Every day | ORAL | 3 refills | Status: DC

## 2023-03-25 ENCOUNTER — Telehealth: Payer: Self-pay | Admitting: Dietician

## 2023-03-25 NOTE — Telephone Encounter (Signed)
 Returned patient call re:  recent diagnosis of Sitosterolemia.   This diagnosis would not be covered by insurance at our office and although patient is willing to self pay, he asked if he needed to be seen. After discussing with patient it was determined that he is very well researched and knows what foods to avoid (foods high in plant sterols).  Discussed that he should continue a balanced diet without these foods.  Patient to call with further questions.  Leita Constable, RD, LDN, CDCES

## 2023-03-27 ENCOUNTER — Encounter: Payer: Self-pay | Admitting: Internal Medicine

## 2023-04-28 DIAGNOSIS — L089 Local infection of the skin and subcutaneous tissue, unspecified: Secondary | ICD-10-CM | POA: Diagnosis not present

## 2023-04-28 DIAGNOSIS — Z6822 Body mass index (BMI) 22.0-22.9, adult: Secondary | ICD-10-CM | POA: Diagnosis not present

## 2023-05-09 ENCOUNTER — Telehealth (HOSPITAL_COMMUNITY): Payer: Self-pay | Admitting: Cardiology

## 2023-05-09 NOTE — Telephone Encounter (Signed)
Called patient at [phone] to remind patient of his appt on Monday 05/12/23 at 11:40 AM to see Dr. Shirlee Latch.  Front office confirmed appt with patient over the telephone on 05/09/23 and patient confirmed that he will be here for this appt on Monday 05/12/23.

## 2023-05-12 ENCOUNTER — Ambulatory Visit (HOSPITAL_COMMUNITY)
Admission: RE | Admit: 2023-05-12 | Discharge: 2023-05-12 | Disposition: A | Discharge status: Home / Self Care | Payer: Medicare Other | Source: Ambulatory Visit | Attending: Cardiology | Admitting: Cardiology

## 2023-05-12 ENCOUNTER — Encounter (HOSPITAL_COMMUNITY): Payer: Self-pay | Admitting: Cardiology

## 2023-05-12 VITALS — BP 120/80 | HR 60 | Wt 157.0 lb

## 2023-05-12 DIAGNOSIS — I4892 Unspecified atrial flutter: Secondary | ICD-10-CM | POA: Insufficient documentation

## 2023-05-12 DIAGNOSIS — I4891 Unspecified atrial fibrillation: Secondary | ICD-10-CM | POA: Insufficient documentation

## 2023-05-12 DIAGNOSIS — Z7901 Long term (current) use of anticoagulants: Secondary | ICD-10-CM | POA: Diagnosis not present

## 2023-05-12 DIAGNOSIS — E7801 Familial hypercholesterolemia: Secondary | ICD-10-CM | POA: Diagnosis not present

## 2023-05-12 DIAGNOSIS — N183 Chronic kidney disease, stage 3 unspecified: Secondary | ICD-10-CM | POA: Diagnosis not present

## 2023-05-12 DIAGNOSIS — I2510 Atherosclerotic heart disease of native coronary artery without angina pectoris: Secondary | ICD-10-CM

## 2023-05-12 DIAGNOSIS — Z951 Presence of aortocoronary bypass graft: Secondary | ICD-10-CM

## 2023-05-12 DIAGNOSIS — I4819 Other persistent atrial fibrillation: Secondary | ICD-10-CM | POA: Diagnosis not present

## 2023-05-12 DIAGNOSIS — I5032 Chronic diastolic (congestive) heart failure: Secondary | ICD-10-CM | POA: Insufficient documentation

## 2023-05-12 DIAGNOSIS — Z79899 Other long term (current) drug therapy: Secondary | ICD-10-CM | POA: Diagnosis not present

## 2023-05-12 DIAGNOSIS — Z7984 Long term (current) use of oral hypoglycemic drugs: Secondary | ICD-10-CM | POA: Diagnosis not present

## 2023-05-12 MED ORDER — DAPAGLIFLOZIN PROPANEDIOL 10 MG PO TABS: 10.0000 mg | ORAL_TABLET | Freq: Every day | ORAL | 3 refills | Status: AC

## 2023-05-12 MED ORDER — COLCHICINE 0.6 MG PO TABS: 0.6000 mg | ORAL_TABLET | Freq: Every day | ORAL | 3 refills | Status: AC

## 2023-05-12 NOTE — Patient Instructions (Signed)
 There has been no changes to your medications.  Your physician recommends that you schedule a follow-up appointment in: 6 months (August) ** PLEASE CALL THE OFFICE IN [place] TO ARRANGE YOUR FOLLOW UP APPOINTMENT.**  If you have any questions or concerns before your next appointment please send [place] a message through [place] or call our office at [phone].    TO LEAVE A MESSAGE FOR THE NURSE SELECT OPTION 2, PLEASE LEAVE A MESSAGE INCLUDING: YOUR NAME DATE OF BIRTH CALL BACK NUMBER REASON FOR CALL**this is important as we prioritize the call backs  YOU WILL RECEIVE A CALL BACK THE SAME DAY AS LONG AS YOU CALL BEFORE 4:00 PM  At the [facility], you and your health needs are our priority. As part of our continuing mission to provide you with exceptional heart care, we have created designated Provider Care Teams. These Care Teams include your primary Cardiologist (physician) and Advanced Practice Providers (APPs- Physician Assistants and Nurse Practitioners) who all work together to provide you with the care you need, when you need it.   You may see any of the following providers on your designated Care Team at your next follow up: Dr Arvilla Meres Dr Marca Ancona Dr. Dorthula Nettles Dr. Clearnce Hasten Amy Filbert Schilder, NP Robbie Lis, [place] [facility] [place], [place] Brynda Peon, NP [place] Lee, NP Clarisa Kindred, NP Karle Plumber, PharmD Enos Fling, PharmD   Please be sure to bring in all your medications bottles to every appointment.    Thank you for choosing [facility]

## 2023-05-12 NOTE — Progress Notes (Signed)
 ID:  Walter George, DOB 12-Apr-1945, MRN [id number]   Provider location: [facility] Type of Visit: Established patient   PCP:  Rodrigo Ran, MD  Cardiologist:  Dr. Shirlee Latch  Chief complaint: CAD   History of Present Illness: Walter George is a 78 y.o. with hyperlipidemia and abnormal calcium score scan who presents for followup of CAD and atrial flutter.  He had a screening coronary calcium score done in 10/20.  This showed 1654 Agatston units, placing him in the 91st percentile for his age and gender.  He is now on atorvastatin. Cardiolite in 10/20 showed no ischemia or infarction.  Of note, his younger brother died suddenly, autopsy showed he had a myocardial infarction.   He saw his PCP in 11/23, HR was irregular on exam and ECG showed atrial fibrillation or flutter.  He was sent to atrial fibrillation clinic, with ECG showing atrial flutter.  He went into atrial fibrillation and had DCCV in 1/24.  He was then seen by Dr. Elberta Fortis with plan for atrial fibrillation ablation. Of note, the pre-ablation CTA showed a significantly increased calcium score of 2599.   Patient had atrial fibrillation ablation on 5/3, the next day he noted pleuritic chest pain.  He went to the ER, and HS-TnI was elevated but without trend.  He ended up getting a heart catheterization showing severe chronic-appearing 3 vessel disease without a culprit lesion. Pleuritic chest pain and mild troponin elevation thought to be due to the atrial fibrillation ablation.  He was admitted and had CABG x 5 with LIMA-LAD, SVG-dRCA, SVG-D, sequential SVG-ramus and OM, LA appendage was clipped.  Pre-op echo showed EF 55-60% with normal RV.  Post-op course complicated by recurrent atrial fibrillation that resolved with amiodarone.  He had post-op hypotension requiring midodrine that was titrated off and post-op volume overload.   Echo in 7/24 showed EF 55-60%, normal diastolic function, normal RV,  trivial MR, IVC normal.   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   He returns today for followup of CAD.  He is doing well symptomatically.  No chest pain.  Playing tennis regularly, no exertional dyspnea.  No lightheadedness.  He is trying to follow a diet low in plant sterols but is finding it hard.   Labs (9/20): LDL 93, HDL 71, K 4.8, creatinine 1.3 Labs (11/20): K 4.8, creatinine 1.13 Labs (12/20): LDL 63, HDL 63 Labs (9/21): LDL 61, HDL 71, TGs 66 Labs (3/22): LDL 43, HDL 59 Labs (5/24): LDL 36, Lp(a) 16.1, K 5.4, creatinine 1.44, BNP 519, TSH 5.18, LDL particle number 393 Labs (7/24): TSH normal, HS-CRP 0.49, BNP 201, K 3.9, creatinine 1.6 Labs (10/24): LDL 33, K 4.8, creatinine 1.6  ECG (personally reviewed): NSR, normal  PMH: 1. Nephrolithiasis 2. Hyperlipidemia 3. CAD: Coronary calcium score scan in 10/20 with 1654 Agatston units, placing the patient in the 91st percentile for age/gender. - Echo (11/18): EF 55-60%, mild LVH.   - ETT-Cardiolite (10/20) with 16 METS, no significant ST changes, EF 61%, no ischemia/infarction.  - Cath 5/24 with severe 3 vessel disease, had CABG x 5 with LIMA-LAD, SVG-dRCA, SVG-D, sequential SVG-ramus and OM 4. Atrial flutter/fibrillation: Diagnosed in 11/23 - DCCV 1/24 - Atrial fibrillation ablation 5/24 - LA appendage clipping with CABG 5/24 5. Chronic diastolic CHF: Echo (5/24) with EF 55-60%, normal RV.  - Echo (  7/24): EF 55-60%, normal diastolic function, normal RV, trivial MR, IVC normal.  6. 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  Occasional ETOH, never smoker.   FH: Father died with lymphoma, mother had dementia and died at 56.  Younger brother with SCD from MI.   ROS: All systems reviewed and negative  except as per HPI.   Current Outpatient Medications  Medication Sig Dispense Refill   acetaminophen (TYLENOL) 500 MG tablet Take 1,000 mg by mouth every 6 (six) hours as needed for headache or moderate pain.     apixaban (ELIQUIS) 5 MG TABS tablet Take 1 tablet (5 mg total) by mouth 2 (two) times daily. 180 tablet 1   atorvastatin (LIPITOR) 80 MG tablet Take 1 tablet (80 mg total) by mouth daily. 90 tablet 3   ezetimibe (ZETIA) 10 MG tablet Take 1 tablet (10 mg total) by mouth daily. 90 tablet 3   Multiple Vitamin (MULTIVITAMIN WITH MINERALS) TABS tablet Take 1 tablet by mouth daily.     colchicine 0.6 MG tablet Take 1 tablet (0.6 mg total) by mouth daily. 90 tablet 3   dapagliflozin propanediol (FARXIGA) 10 MG TABS tablet Take 1 tablet (10 mg total) by mouth daily. 90 tablet 3   No current facility-administered medications for this encounter.   BP 120/80   Pulse 60   Wt 71.2 kg (157 lb)   SpO2 99%   BMI 23.18 kg/m  General: NAD Neck: No JVD, no thyromegaly or thyroid nodule.  Lungs: Clear to auscultation bilaterally with normal respiratory effort. CV: Nondisplaced PMI.  Heart regular S1/S2, no S3/S4, no murmur.  No peripheral edema.  No carotid bruit.  Normal pedal pulses.  Abdomen: Soft, nontender, no hepatosplenomegaly, no distention.  Skin: Intact without lesions or rashes.  Neurologic: Alert and oriented x 3.  Psych: Normal affect. Extremities: No clubbing or cyanosis.  HEENT: Normal.   Assessment/Plan: 1. CAD: S/p CABG in 5/24. Patient was admitted with pleuritic chest pain that was likely due to AF ablation the prior day, but had cath with 3VD (chronic lesions) resulting in CABG.  Doing well post-op, no exertional symptoms.  - No ASA given Eliquis use.  - We discussed restarting ramipril today for secondary prevention, he wants to avoid additional medications so will hold off for now.  - Continue atorvastatin 80 mg daily and Zetia 10 mg daily, excellent lipids in 10/24 and  low Lp(a) as well as LDL particle number.   - Continue Farxiga 10 mg daily.  2. Hyperlipidemia: Goal LDL < 55. Lp(a) and LDL particle number not elevated.  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 This may explain his advanced CAD despite good control of LDL long-term.  - Continue atorvastatin and Zetia.  - In terms of sitosterolemia, Zetia can be helpful.  He is also trying to follow a diet low in plant sterols (this has been hard).  3. Atrial fibrillation/flutter: s/p AF ablation in 5/24 and LA appendage clipping with CABG. He is now off amiodarone and maintaining NSR.  - Continue Eliquis.  4. Chronic diastolic CHF: Volume overload post-CABG, was diuresed in hospital.  Echo in 7/24 showed EF 55-60%, normal diastolic function, normal RV, trivial MR, IVC normal.  - Continue Farxiga 10 mg daily.  5. CKD stage 3: Last creatinine 1.6.  - He will get a BMET soon with his PCP.  -  Continue Farxiga.  - Would like to eventually get him back on ramipril  - Would consider future finerenone.   Followup in 6 months.    I spent 21 minutes reviewing data, interviewing patient, and organizing the orders/followup.   Signed, Marca Ancona, MD  05/12/2023  [facility] [address] Heart [facility] [place] [address] [phone] (office) [phone] (fax)

## 2023-05-18 ENCOUNTER — Other Ambulatory Visit: Payer: Self-pay | Admitting: Cardiology

## 2023-05-18 DIAGNOSIS — I4819 Other persistent atrial fibrillation: Secondary | ICD-10-CM

## 2023-05-19 DIAGNOSIS — Z85828 Personal history of other malignant neoplasm of skin: Secondary | ICD-10-CM | POA: Diagnosis not present

## 2023-05-19 DIAGNOSIS — D225 Melanocytic nevi of trunk: Secondary | ICD-10-CM | POA: Diagnosis not present

## 2023-05-19 DIAGNOSIS — C44519 Basal cell carcinoma of skin of other part of trunk: Secondary | ICD-10-CM | POA: Diagnosis not present

## 2023-05-19 DIAGNOSIS — L812 Freckles: Secondary | ICD-10-CM | POA: Diagnosis not present

## 2023-05-19 DIAGNOSIS — D692 Other nonthrombocytopenic purpura: Secondary | ICD-10-CM | POA: Diagnosis not present

## 2023-05-19 NOTE — Telephone Encounter (Signed)
 Prescription refill request for Eliquis received. Indication:afib Last office visit:2/25 Scr:1.62  10/24 Age: 78 Weight:71.2  kg  Prescription refilled

## 2023-05-20 DIAGNOSIS — Z125 Encounter for screening for malignant neoplasm of prostate: Secondary | ICD-10-CM | POA: Diagnosis not present

## 2023-05-20 DIAGNOSIS — Z1212 Encounter for screening for malignant neoplasm of rectum: Secondary | ICD-10-CM | POA: Diagnosis not present

## 2023-05-20 DIAGNOSIS — E785 Hyperlipidemia, unspecified: Secondary | ICD-10-CM | POA: Diagnosis not present

## 2023-05-26 DIAGNOSIS — Z1212 Encounter for screening for malignant neoplasm of rectum: Secondary | ICD-10-CM | POA: Diagnosis not present

## 2023-05-27 DIAGNOSIS — R82998 Other abnormal findings in urine: Secondary | ICD-10-CM | POA: Diagnosis not present

## 2023-05-27 DIAGNOSIS — Z0000 Encounter for general adult medical examination without abnormal findings: Secondary | ICD-10-CM | POA: Diagnosis not present

## 2023-05-27 DIAGNOSIS — L97509 Non-pressure chronic ulcer of other part of unspecified foot with unspecified severity: Secondary | ICD-10-CM | POA: Diagnosis not present

## 2023-05-27 DIAGNOSIS — Z1331 Encounter for screening for depression: Secondary | ICD-10-CM | POA: Diagnosis not present

## 2023-05-27 DIAGNOSIS — Z1339 Encounter for screening examination for other mental health and behavioral disorders: Secondary | ICD-10-CM | POA: Diagnosis not present

## 2023-06-09 DIAGNOSIS — L97519 Non-pressure chronic ulcer of other part of right foot with unspecified severity: Secondary | ICD-10-CM | POA: Diagnosis not present

## 2023-06-09 DIAGNOSIS — M79671 Pain in right foot: Secondary | ICD-10-CM | POA: Diagnosis not present

## 2023-06-18 ENCOUNTER — Telehealth (HOSPITAL_COMMUNITY): Payer: Self-pay

## 2023-06-18 ENCOUNTER — Other Ambulatory Visit (HOSPITAL_COMMUNITY): Payer: Self-pay

## 2023-06-18 DIAGNOSIS — Z1211 Encounter for screening for malignant neoplasm of colon: Secondary | ICD-10-CM | POA: Diagnosis not present

## 2023-06-18 NOTE — Telephone Encounter (Signed)
 [facility] Patient Advocate Encounter  Received notification that prior authorization is required for Dapagliflozin. Review of this plan shows that brand Marcelline Deist is preferred. Test billing for Farxiga (DAW 9) returns refill too soon rejection; unable to confirm copay. No prior authorization needed at this time.  Burnell Blanks, CPhT Rx Patient Advocate Phone: [phone]

## 2023-07-22 ENCOUNTER — Other Ambulatory Visit (HOSPITAL_COMMUNITY): Payer: Self-pay | Admitting: Cardiology

## 2023-07-22 MED ORDER — EZETIMIBE 10 MG PO TABS: 10.0000 mg | ORAL_TABLET | Freq: Every day | ORAL | 3 refills | Status: AC

## 2023-09-26 ENCOUNTER — Other Ambulatory Visit (HOSPITAL_COMMUNITY): Payer: Self-pay | Admitting: Cardiology

## 2023-09-26 DIAGNOSIS — I4819 Other persistent atrial fibrillation: Secondary | ICD-10-CM

## 2023-09-26 MED ORDER — APIXABAN 5 MG PO TABS: 5.0000 mg | ORAL_TABLET | Freq: Two times a day (BID) | ORAL | 1 refills | Status: DC

## 2023-11-18 DIAGNOSIS — M79645 Pain in left finger(s): Secondary | ICD-10-CM | POA: Diagnosis not present

## 2023-11-24 DIAGNOSIS — Z85828 Personal history of other malignant neoplasm of skin: Secondary | ICD-10-CM | POA: Diagnosis not present

## 2023-11-24 DIAGNOSIS — L821 Other seborrheic keratosis: Secondary | ICD-10-CM | POA: Diagnosis not present

## 2023-12-31 DIAGNOSIS — H04123 Dry eye syndrome of bilateral lacrimal glands: Secondary | ICD-10-CM | POA: Diagnosis not present

## 2023-12-31 DIAGNOSIS — Z961 Presence of intraocular lens: Secondary | ICD-10-CM | POA: Diagnosis not present

## 2023-12-31 DIAGNOSIS — H43813 Vitreous degeneration, bilateral: Secondary | ICD-10-CM | POA: Diagnosis not present

## 2024-02-19 ENCOUNTER — Other Ambulatory Visit (HOSPITAL_COMMUNITY): Payer: Self-pay

## 2024-02-19 DIAGNOSIS — I4819 Other persistent atrial fibrillation: Secondary | ICD-10-CM

## 2024-02-19 MED ORDER — APIXABAN 5 MG PO TABS: 5.0000 mg | ORAL_TABLET | Freq: Two times a day (BID) | ORAL | 1 refills | Status: AC

## 2024-03-17 ENCOUNTER — Other Ambulatory Visit (HOSPITAL_COMMUNITY): Payer: Self-pay

## 2024-03-17 DIAGNOSIS — I4891 Unspecified atrial fibrillation: Secondary | ICD-10-CM

## 2024-03-17 MED ORDER — ATORVASTATIN CALCIUM 80 MG PO TABS: 80.0000 mg | ORAL_TABLET | Freq: Every day | ORAL | 3 refills | Status: AC

## 2024-04-07 ENCOUNTER — Ambulatory Visit (HOSPITAL_COMMUNITY)
Admission: RE | Admit: 2024-04-07 | Discharge: 2024-04-07 | Disposition: A | Discharge status: Home / Self Care | Source: Ambulatory Visit | Attending: Cardiology | Admitting: Cardiology

## 2024-04-07 ENCOUNTER — Encounter (HOSPITAL_COMMUNITY): Payer: Self-pay | Admitting: Cardiology

## 2024-04-07 ENCOUNTER — Ambulatory Visit (HOSPITAL_COMMUNITY): Payer: Self-pay | Admitting: Cardiology

## 2024-04-07 VITALS — BP 130/80 | HR 51 | Wt 153.4 lb

## 2024-04-07 DIAGNOSIS — Z79899 Other long term (current) drug therapy: Secondary | ICD-10-CM | POA: Insufficient documentation

## 2024-04-07 DIAGNOSIS — I4892 Unspecified atrial flutter: Secondary | ICD-10-CM | POA: Diagnosis not present

## 2024-04-07 DIAGNOSIS — N183 Chronic kidney disease, stage 3 unspecified: Secondary | ICD-10-CM | POA: Diagnosis not present

## 2024-04-07 DIAGNOSIS — I4819 Other persistent atrial fibrillation: Secondary | ICD-10-CM | POA: Diagnosis not present

## 2024-04-07 DIAGNOSIS — Z951 Presence of aortocoronary bypass graft: Secondary | ICD-10-CM | POA: Insufficient documentation

## 2024-04-07 DIAGNOSIS — I2510 Atherosclerotic heart disease of native coronary artery without angina pectoris: Secondary | ICD-10-CM | POA: Insufficient documentation

## 2024-04-07 DIAGNOSIS — E785 Hyperlipidemia, unspecified: Secondary | ICD-10-CM | POA: Insufficient documentation

## 2024-04-07 DIAGNOSIS — Z7901 Long term (current) use of anticoagulants: Secondary | ICD-10-CM | POA: Insufficient documentation

## 2024-04-07 DIAGNOSIS — I5032 Chronic diastolic (congestive) heart failure: Secondary | ICD-10-CM | POA: Diagnosis not present

## 2024-04-07 DIAGNOSIS — Z7984 Long term (current) use of oral hypoglycemic drugs: Secondary | ICD-10-CM | POA: Insufficient documentation

## 2024-04-07 LAB — BASIC METABOLIC PANEL WITH GFR
Anion gap: 11 (ref 5–15)
BUN: 29 mg/dL — ABNORMAL HIGH (ref 8–23)
CO2: 24 mmol/L (ref 22–32)
Calcium: 9.1 mg/dL (ref 8.9–10.3)
Chloride: 106 mmol/L (ref 98–111)
Creatinine, Ser: 1.25 mg/dL — ABNORMAL HIGH (ref 0.61–1.24)
GFR, Estimated: 59 mL/min — ABNORMAL LOW
Glucose, Bld: 68 mg/dL — ABNORMAL LOW (ref 70–99)
Potassium: 5.3 mmol/L — ABNORMAL HIGH (ref 3.5–5.1)
Sodium: 141 mmol/L (ref 135–145)

## 2024-04-07 LAB — CBC
HCT: 48.2 % (ref 39.0–52.0)
Hemoglobin: 16.4 g/dL (ref 13.0–17.0)
MCH: 31.4 pg (ref 26.0–34.0)
MCHC: 34 g/dL (ref 30.0–36.0)
MCV: 92.2 fL (ref 80.0–100.0)
Platelets: 231 K/uL (ref 150–400)
RBC: 5.23 MIL/uL (ref 4.22–5.81)
RDW: 12.3 % (ref 11.5–15.5)
WBC: 5.4 K/uL (ref 4.0–10.5)
nRBC: 0 % (ref 0.0–0.2)

## 2024-04-07 LAB — LIPID PANEL
Cholesterol: 115 mg/dL (ref 0–200)
HDL: 58 mg/dL
LDL Cholesterol: 41 mg/dL (ref 0–99)
Total CHOL/HDL Ratio: 2 ratio
Triglycerides: 79 mg/dL
VLDL: 16 mg/dL (ref 0–40)

## 2024-04-07 LAB — PRO BRAIN NATRIURETIC PEPTIDE: Pro Brain Natriuretic Peptide: 164 pg/mL

## 2024-04-07 NOTE — Patient Instructions (Signed)
 Medication Changes:  None, continue current medications  Lab Work:  Labs done today, your results will be available in MyChart, we will contact you for abnormal readings.   Special Instructions // Education:  Do the following things EVERYDAY: Weigh yourself in the morning before breakfast. Write it down and keep it in a log. Take your medicines as prescribed Eat low salt foods--Limit salt (sodium) to 2000 mg per day.  Stay as active as you can everyday Limit all fluids for the day to less than 2 liters   Follow-Up in: 1 year (Jan 2027), **PLEASE CALL OUR OFFICE IN NOVEMBER TO SCHEDULE THIS APPOINTMENT   At the [facility], you and your health needs are our priority. We have a designated team specialized in the treatment of Heart Failure. This Care Team includes your primary Heart Failure Specialized Cardiologist (physician), Advanced Practice Providers (APPs- Physician Assistants and Nurse Practitioners), and Pharmacist who all work together to provide you with the care you need, when you need it.   You may see any of the following providers on your designated Care Team at your next follow up:  Dr. Toribio Fuel Dr. Ezra Shuck Dr. Odis Brownie Greig Mosses, NP Caffie Shed, [place] [facility] [place], [place] Beckey Coe, NP Jordan Lee, NP Tinnie Redman, PharmD   Please be sure to bring in all your medications bottles to every appointment.   Need to Contact Us :  If you have any questions or concerns before your next appointment please send us  a message through [place] or call our office at [phone].    TO LEAVE A MESSAGE FOR THE NURSE SELECT OPTION 2, PLEASE LEAVE A MESSAGE INCLUDING: YOUR NAME DATE OF BIRTH CALL BACK NUMBER REASON FOR CALL**this is important as we prioritize the call backs  YOU WILL RECEIVE A CALL BACK THE SAME DAY AS LONG AS YOU CALL BEFORE 4:00 PM

## 2024-04-08 NOTE — Progress Notes (Signed)
 "      ID:  Walter George, DOB January 21, 1946, MRN [id number]   Provider location: [facility] Type of Visit: Established patient   PCP:  Shayne Anes, MD  Cardiologist:  Dr. Rolan  Chief complaint: CAD   History of Present Illness: Walter George is a 79 y.o. with hyperlipidemia and abnormal calcium  score scan who presents for followup of CAD and atrial flutter.  He had a screening coronary calcium  score done in 10/20.  This showed 1654 Agatston units, placing him in the 91st percentile for his age and gender.  He is now on atorvastatin . Cardiolite in 10/20 showed no ischemia or infarction.  Of note, his younger brother died suddenly, autopsy showed he had a myocardial infarction.   He saw his PCP in 11/23, HR was irregular on exam and ECG showed atrial fibrillation or flutter.  He was sent to atrial fibrillation clinic, with ECG showing atrial flutter.  He went into atrial fibrillation and had DCCV in 1/24.  He was then seen by Dr. Inocencio with plan for atrial fibrillation ablation. Of note, the pre-ablation CTA showed a significantly increased calcium  score of 2599.   Patient had atrial fibrillation ablation on 5/3, the next day he noted pleuritic chest pain.  He went to the ER, and HS-TnI was elevated but without trend.  He ended up getting a heart catheterization showing severe chronic-appearing 3 vessel disease without a culprit lesion. Pleuritic chest pain and mild troponin elevation thought to be due to the atrial fibrillation ablation.  He was admitted and had CABG x 5 with LIMA-LAD, SVG-dRCA, SVG-D, sequential SVG-ramus and OM, LA appendage was clipped.  Pre-op echo showed EF 55-60% with normal RV.  Post-op course complicated by recurrent atrial fibrillation that resolved with amiodarone .  He had post-op hypotension requiring midodrine  that was titrated off and post-op volume overload.   Echo in 7/24 showed EF 55-60%, normal diastolic function, normal RV,  trivial MR, IVC normal.   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   He returns today for followup of CAD.  He is doing well symptomatically, he plays tennis 3 days/week.  No exertional dyspnea or chest pain.  No palpitations.  Weight down 4 lbs.  No atrial fibrillation noted on Apple Watch.   Labs (5/24): LDL 36, Lp(a) 16.1, K 5.4, creatinine 1.44, BNP 519, TSH 5.18, LDL particle number 393 Labs (7/24): TSH normal, HS-CRP 0.49, BNP 201, K 3.9, creatinine 1.6 Labs (10/24): LDL 33, K 4.8, creatinine 1.6  ECG (personally reviewed): NSR, normal  PMH: 1. Nephrolithiasis 2. Hyperlipidemia 3. CAD: Coronary calcium  score scan in 10/20 with 1654 Agatston units, placing the patient in the 91st percentile for age/gender. - Echo (11/18): EF 55-60%, mild LVH.   - ETT-Cardiolite (10/20) with 16 METS, no significant ST changes, EF 61%, no ischemia/infarction.  - Cath 5/24 with severe 3 vessel disease, had CABG x 5 with LIMA-LAD, SVG-dRCA, SVG-D, sequential SVG-ramus and OM 4. Atrial flutter/fibrillation: Diagnosed in 11/23 - DCCV 1/24 - Atrial fibrillation ablation 5/24 - LA appendage clipping with CABG 5/24 5. Chronic diastolic CHF: Echo (5/24) with EF 55-60%, normal RV.  - Echo (7/24): EF 55-60%, normal diastolic function, normal RV, trivial MR, IVC normal.  6. 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  Occasional ETOH, never smoker.   FH: Father died with lymphoma, mother had dementia and died at 21.  Younger brother with SCD from MI.   ROS: All systems reviewed and negative except as per HPI.   Current Outpatient Medications  Medication Sig Dispense Refill   acetaminophen  (TYLENOL ) 500 MG tablet Take 1,000 mg by mouth every 6 (six) hours as needed for headache or  moderate pain.     apixaban  (ELIQUIS ) 5 MG TABS tablet Take 1 tablet (5 mg total) by mouth 2 (two) times daily. 180 tablet 1   atorvastatin  (LIPITOR ) 80 MG tablet Take 1 tablet (80 mg total) by mouth daily. 90 tablet 3   colchicine  0.6 MG tablet Take 1 tablet (0.6 mg total) by mouth daily. 90 tablet 3   dapagliflozin  propanediol (FARXIGA ) 10 MG TABS tablet Take 1 tablet (10 mg total) by mouth daily. 90 tablet 3   ezetimibe  (ZETIA ) 10 MG tablet Take 1 tablet (10 mg total) by mouth daily. 90 tablet 3   Multiple Vitamin (MULTIVITAMIN WITH MINERALS) TABS tablet Take 1 tablet by mouth daily.     No current facility-administered medications for this encounter.   BP 130/80   Pulse (!) 51   Wt 69.6 kg (153 lb 6.4 oz)   SpO2 100%   BMI 22.65 kg/m  General: NAD Neck: No JVD, no thyromegaly or thyroid  nodule.  Lungs: Clear to auscultation bilaterally with normal respiratory effort. CV: Nondisplaced PMI.  Heart regular S1/S2, no S3/S4, no murmur.  No peripheral edema.  No carotid bruit.  Normal pedal pulses.  Abdomen: Soft, nontender, no hepatosplenomegaly, no distention.  Skin: Intact without lesions or rashes.  Neurologic: Alert and oriented x 3.  Psych: Normal affect. Extremities: No clubbing or cyanosis.  HEENT: Normal.   Assessment/Plan: 1. CAD: S/p CABG in 5/24. Patient was admitted with pleuritic chest pain that was likely due to AF ablation the prior day, but had cath with 3VD (chronic lesions) resulting in CABG.  Doing well post-op, no exertional symptoms.  - No ASA given Eliquis  use.  - We discussed restarting ramipril  today for secondary prevention, he wants to avoid additional medications so will hold off for now.  - Continue atorvastatin  80 mg daily and Zetia  10 mg daily, check lipids today.  - Continue Farxiga  10 mg daily.  - He asks about screening stress test today.  He is completely asymptomatic and CABG was in 2024.  I told him that we could consider a cardiac PET in 2027  unless he develops recurrent symptoms.  2. Hyperlipidemia: Goal LDL < 55. Lp(a) and LDL particle number not elevated.  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 This may explain his advanced CAD despite good control of LDL long-term.  - Continue atorvastatin  and Zetia . Check lipids today.  - In terms of sitosterolemia, Zetia  can be helpful.  He is also trying to follow a diet low in plant sterols (this has been hard).  3. Atrial fibrillation/flutter: s/p AF ablation in 5/24 and LA appendage clipping with CABG. He is now off amiodarone  and maintaining NSR.  - Continue Eliquis .  4. Chronic diastolic CHF: Volume overload post-CABG, was diuresed in hospital.  Echo in 7/24 showed EF 55-60%, normal diastolic function, normal RV, trivial MR, IVC normal.  - Continue Farxiga  10 mg daily.  5. CKD stage 3: Last creatinine 1.6.  - BMET today.  - Continue Farxiga .  - Would consider future finerenone.   Followup  in 1 year    I spent 22 minutes reviewing data, interviewing patient, and organizing the orders/followup.   Signed, Ezra Shuck, MD  04/08/2024  [facility] [address] [facility] [place] [address] [phone] (office) [phone] (fax) "
